# Patient Record
Sex: Male | Born: 1945
Health system: Southern US, Community
[De-identification: ages and names within clinical notes are randomized; demographics above are authoritative.]

## PROBLEM LIST (undated history)

## (undated) DIAGNOSIS — I452 Bifascicular block: Secondary | ICD-10-CM

## (undated) DIAGNOSIS — M1990 Unspecified osteoarthritis, unspecified site: Secondary | ICD-10-CM

## (undated) DIAGNOSIS — F410 Panic disorder [episodic paroxysmal anxiety] without agoraphobia: Secondary | ICD-10-CM

## (undated) DIAGNOSIS — K2900 Acute gastritis without bleeding: Secondary | ICD-10-CM

## (undated) DIAGNOSIS — J449 Chronic obstructive pulmonary disease, unspecified: Secondary | ICD-10-CM

## (undated) DIAGNOSIS — I10 Essential (primary) hypertension: Secondary | ICD-10-CM

## (undated) DIAGNOSIS — G459 Transient cerebral ischemic attack, unspecified: Secondary | ICD-10-CM

## (undated) DIAGNOSIS — D126 Benign neoplasm of colon, unspecified: Secondary | ICD-10-CM

## (undated) DIAGNOSIS — R0601 Orthopnea: Secondary | ICD-10-CM

## (undated) DIAGNOSIS — K209 Esophagitis, unspecified: Secondary | ICD-10-CM

---

## 2005-11-16 DIAGNOSIS — K2900 Acute gastritis without bleeding: Secondary | ICD-10-CM

## 2005-11-16 DIAGNOSIS — D126 Benign neoplasm of colon, unspecified: Secondary | ICD-10-CM

## 2005-11-16 DIAGNOSIS — K209 Esophagitis, unspecified without bleeding: Secondary | ICD-10-CM

## 2005-11-16 HISTORY — DX: Benign neoplasm of colon, unspecified: D12.6

## 2005-11-16 HISTORY — DX: Acute gastritis without bleeding: K29.00

## 2005-11-16 HISTORY — DX: Esophagitis, unspecified without bleeding: K20.90

## 2005-12-23 ENCOUNTER — Ambulatory Visit: Payer: Self-pay | Admitting: Internal Medicine

## 2005-12-30 ENCOUNTER — Ambulatory Visit: Payer: Self-pay | Admitting: Internal Medicine

## 2006-02-03 ENCOUNTER — Ambulatory Visit: Payer: Self-pay | Admitting: Internal Medicine

## 2006-06-28 ENCOUNTER — Ambulatory Visit: Payer: Self-pay | Admitting: General Surgery

## 2015-11-17 HISTORY — PX: SKIN CANCER EXCISION: SHX779

## 2016-05-15 DIAGNOSIS — K9189 Other postprocedural complications and disorders of digestive system: Secondary | ICD-10-CM | POA: Diagnosis not present

## 2016-05-15 DIAGNOSIS — I10 Essential (primary) hypertension: Secondary | ICD-10-CM | POA: Diagnosis not present

## 2016-05-15 DIAGNOSIS — Z85828 Personal history of other malignant neoplasm of skin: Secondary | ICD-10-CM | POA: Diagnosis not present

## 2016-05-15 DIAGNOSIS — Z87891 Personal history of nicotine dependence: Secondary | ICD-10-CM | POA: Diagnosis not present

## 2016-05-15 DIAGNOSIS — H61111 Acquired deformity of pinna, right ear: Secondary | ICD-10-CM | POA: Diagnosis not present

## 2016-05-15 DIAGNOSIS — Z7982 Long term (current) use of aspirin: Secondary | ICD-10-CM | POA: Diagnosis not present

## 2016-05-15 DIAGNOSIS — Z8249 Family history of ischemic heart disease and other diseases of the circulatory system: Secondary | ICD-10-CM | POA: Diagnosis not present

## 2016-05-15 DIAGNOSIS — C44212 Basal cell carcinoma of skin of right ear and external auricular canal: Secondary | ICD-10-CM | POA: Diagnosis not present

## 2016-06-11 DIAGNOSIS — L720 Epidermal cyst: Secondary | ICD-10-CM | POA: Diagnosis not present

## 2016-06-11 DIAGNOSIS — L570 Actinic keratosis: Secondary | ICD-10-CM | POA: Diagnosis not present

## 2016-06-23 DIAGNOSIS — I10 Essential (primary) hypertension: Secondary | ICD-10-CM | POA: Diagnosis not present

## 2016-06-23 DIAGNOSIS — Z7982 Long term (current) use of aspirin: Secondary | ICD-10-CM | POA: Diagnosis not present

## 2016-06-23 DIAGNOSIS — Z79899 Other long term (current) drug therapy: Secondary | ICD-10-CM | POA: Diagnosis not present

## 2016-06-23 DIAGNOSIS — Z48810 Encounter for surgical aftercare following surgery on the sense organs: Secondary | ICD-10-CM | POA: Diagnosis not present

## 2016-06-23 DIAGNOSIS — H938X1 Other specified disorders of right ear: Secondary | ICD-10-CM | POA: Diagnosis not present

## 2016-07-14 DIAGNOSIS — S01301D Unspecified open wound of right ear, subsequent encounter: Secondary | ICD-10-CM | POA: Diagnosis not present

## 2016-08-27 DIAGNOSIS — H938X1 Other specified disorders of right ear: Secondary | ICD-10-CM | POA: Diagnosis not present

## 2016-08-27 DIAGNOSIS — H6001 Abscess of right external ear: Secondary | ICD-10-CM | POA: Diagnosis not present

## 2016-08-31 DIAGNOSIS — H6001 Abscess of right external ear: Secondary | ICD-10-CM | POA: Diagnosis not present

## 2016-09-11 DIAGNOSIS — Z23 Encounter for immunization: Secondary | ICD-10-CM | POA: Diagnosis not present

## 2016-12-04 DIAGNOSIS — I10 Essential (primary) hypertension: Secondary | ICD-10-CM | POA: Diagnosis not present

## 2016-12-04 DIAGNOSIS — Z0000 Encounter for general adult medical examination without abnormal findings: Secondary | ICD-10-CM | POA: Diagnosis not present

## 2016-12-04 DIAGNOSIS — Z23 Encounter for immunization: Secondary | ICD-10-CM | POA: Diagnosis not present

## 2017-02-03 DIAGNOSIS — C44311 Basal cell carcinoma of skin of nose: Secondary | ICD-10-CM | POA: Diagnosis not present

## 2017-02-03 DIAGNOSIS — L578 Other skin changes due to chronic exposure to nonionizing radiation: Secondary | ICD-10-CM | POA: Diagnosis not present

## 2017-02-03 DIAGNOSIS — L570 Actinic keratosis: Secondary | ICD-10-CM | POA: Diagnosis not present

## 2017-02-03 DIAGNOSIS — L821 Other seborrheic keratosis: Secondary | ICD-10-CM | POA: Diagnosis not present

## 2017-02-03 DIAGNOSIS — C44212 Basal cell carcinoma of skin of right ear and external auricular canal: Secondary | ICD-10-CM | POA: Diagnosis not present

## 2017-06-08 DIAGNOSIS — Z23 Encounter for immunization: Secondary | ICD-10-CM | POA: Diagnosis not present

## 2017-06-11 ENCOUNTER — Telehealth: Payer: Self-pay | Admitting: *Deleted

## 2017-06-11 NOTE — Telephone Encounter (Signed)
Patient's wife called asking to speak to Dr. Mike Gip or one of the nurses.  Attempted to call patient back.  No answer and no voice mail box set up.

## 2017-06-15 DIAGNOSIS — C44311 Basal cell carcinoma of skin of nose: Secondary | ICD-10-CM | POA: Diagnosis not present

## 2017-06-15 DIAGNOSIS — C44612 Basal cell carcinoma of skin of right upper limb, including shoulder: Secondary | ICD-10-CM | POA: Diagnosis not present

## 2017-07-08 DIAGNOSIS — C44612 Basal cell carcinoma of skin of right upper limb, including shoulder: Secondary | ICD-10-CM | POA: Diagnosis not present

## 2017-08-26 DIAGNOSIS — Z23 Encounter for immunization: Secondary | ICD-10-CM | POA: Diagnosis not present

## 2017-11-29 DIAGNOSIS — I10 Essential (primary) hypertension: Secondary | ICD-10-CM | POA: Diagnosis not present

## 2017-11-29 DIAGNOSIS — Z0000 Encounter for general adult medical examination without abnormal findings: Secondary | ICD-10-CM | POA: Diagnosis not present

## 2018-02-14 DIAGNOSIS — Z6832 Body mass index (BMI) 32.0-32.9, adult: Secondary | ICD-10-CM | POA: Diagnosis not present

## 2018-02-14 DIAGNOSIS — Z1331 Encounter for screening for depression: Secondary | ICD-10-CM | POA: Diagnosis not present

## 2018-02-14 DIAGNOSIS — Z9181 History of falling: Secondary | ICD-10-CM | POA: Diagnosis not present

## 2018-02-14 DIAGNOSIS — M25551 Pain in right hip: Secondary | ICD-10-CM | POA: Diagnosis not present

## 2018-02-17 DIAGNOSIS — M160 Bilateral primary osteoarthritis of hip: Secondary | ICD-10-CM | POA: Diagnosis not present

## 2018-02-17 DIAGNOSIS — M25551 Pain in right hip: Secondary | ICD-10-CM | POA: Diagnosis not present

## 2018-04-21 DIAGNOSIS — M25551 Pain in right hip: Secondary | ICD-10-CM | POA: Diagnosis not present

## 2018-05-21 DIAGNOSIS — T1502XA Foreign body in cornea, left eye, initial encounter: Secondary | ICD-10-CM | POA: Diagnosis not present

## 2018-05-23 DIAGNOSIS — T1502XA Foreign body in cornea, left eye, initial encounter: Secondary | ICD-10-CM | POA: Diagnosis not present

## 2018-05-31 DIAGNOSIS — M1611 Unilateral primary osteoarthritis, right hip: Secondary | ICD-10-CM | POA: Diagnosis not present

## 2018-05-31 DIAGNOSIS — Z1339 Encounter for screening examination for other mental health and behavioral disorders: Secondary | ICD-10-CM | POA: Diagnosis not present

## 2018-05-31 DIAGNOSIS — Z01818 Encounter for other preprocedural examination: Secondary | ICD-10-CM | POA: Diagnosis not present

## 2018-05-31 DIAGNOSIS — I10 Essential (primary) hypertension: Secondary | ICD-10-CM | POA: Diagnosis not present

## 2018-06-21 ENCOUNTER — Encounter: Payer: Self-pay | Admitting: Student

## 2018-06-21 NOTE — H&P (Signed)
TOTAL HIP ADMISSION H&P  Patient is admitted for right total hip arthroplasty.  Subjective:  Chief Complaint: right hip pain  HPI: Jackson Powers, 72 y.o. male, has a history of pain and functional disability in the right hip(s) due to arthritis and patient has failed non-surgical conservative treatments for greater than 12 weeks to include NSAID's and/or analgesics and activity modification.  Onset of symptoms was abrupt starting 1 year ago with rapidly worsening course since that time.The patient noted no past surgery on the right hip(s).  Patient currently rates pain in the right hip at 2 out of 10 with activity. Patient has worsening of pain with activity and weight bearing and pain that interfers with activities of daily living. Patient has evidence of severe bone-on-bone arthritis with large osteophyte formation by imaging studies. This condition presents safety issues increasing the risk of falls. There is no current active infection.  There are no active problems to display for this patient.  History reviewed. No pertinent past medical history.  History reviewed. No pertinent surgical history.  No current facility-administered medications for this encounter.    No current outpatient medications on file.   Allergies not on file  Social History   Tobacco Use  . Smoking status: Not on file  Substance Use Topics  . Alcohol use: Not on file    History reviewed. No pertinent family history.   Review of Systems  Constitutional: Negative for chills and fever.  HENT: Negative for congestion, sore throat and tinnitus.   Eyes: Negative for double vision, photophobia and pain.  Respiratory: Negative for cough, shortness of breath and wheezing.   Cardiovascular: Negative for chest pain, palpitations and orthopnea.  Gastrointestinal: Negative for heartburn, nausea and vomiting.  Genitourinary: Negative for dysuria, frequency and urgency.  Musculoskeletal: Positive for joint pain.    Neurological: Negative for dizziness, weakness and headaches.  Psychiatric/Behavioral: Negative for depression.    Objective:  Physical Exam  Well nourished and well developed. General: Alert and oriented x3, cooperative and pleasant, no acute distress. Head: normocephalic, atraumatic, neck supple. Eyes: EOMI. Respiratory: breath sounds clear in all fields, no wheezing, rales, or rhonchi. Cardiovascular: Regular rate and rhythm, no murmurs, gallops or rubs.  Abdomen: non-tender to palpation and soft, normoactive bowel sounds. Musculoskeletal: Significantly anantalgic gait without the use of assisted devices.  Right Hip Exam: ROM: Flexion to 90, Internal Rotation is minimal, External Rotation 10, and abduction 10 without discomfort. There is no tenderness over the greater trochanter.  Left Hip Exam: ROM: is normal without discomfort. There is no tenderness over the greater trochanter.  Bilateral Knee Exam: No effusion. Range of motion is 0-125 degrees. No crepitus on range of motion of the knee. No medial or lateral joint line tenderness. Stable knees. Calves soft and nontender. Motor function intact in LE. Strength 5/5 LE bilaterally. Neuro: Distal pulses 2+. Sensation to light touch intact in LE.  Vital signs in last 24 hours: Blood pressure: 138/76 mmHg Pulse: 64 bpm  Labs:   There is no height or weight on file to calculate BMI.   Imaging Review Plain radiographs demonstrate severe degenerative joint disease of the right hip(s). The bone quality appears to be adequate for age and reported activity level.    Preoperative templating of the joint replacement has been completed, documented, and submitted to the Operating Room personnel in order to optimize intra-operative equipment management.     Assessment/Plan:  End stage arthritis, right hip(s)  The patient history, physical examination, clinical judgement  of the provider and imaging studies are consistent with end  stage degenerative joint disease of the right hip(s) and total hip arthroplasty is deemed medically necessary. The treatment options including medical management, injection therapy, arthroscopy and arthroplasty were discussed at length. The risks and benefits of total hip arthroplasty were presented and reviewed. The risks due to aseptic loosening, infection, stiffness, dislocation/subluxation,  thromboembolic complications and other imponderables were discussed.  The patient acknowledged the explanation, agreed to proceed with the plan and consent was signed. Patient is being admitted for inpatient treatment for surgery, pain control, PT, OT, prophylactic antibiotics, VTE prophylaxis, progressive ambulation and ADL's and discharge planning.The patient is planning to be discharged home with HEP.   Therapy Plans: HEP Disposition: Home with wife Planned DVT Prophylaxis: Aspirin 325 mg BID DME needed: None PCP: Cyndi Bender, PA-C (medical clearance provided) TXA: IV Allergies: None  - Patient was instructed on what medications to stop prior to surgery. - Follow-up visit in 2 weeks with Dr. Wynelle Link - Begin physical therapy following surgery - Pre-operative lab work as pre-surgical testing - Prescriptions will be provided in hospital at time of discharge  Theresa Duty, PA-C Orthopedic Surgery [facility] [place]

## 2018-07-07 ENCOUNTER — Encounter (HOSPITAL_COMMUNITY): Payer: Self-pay

## 2018-07-07 NOTE — Patient Instructions (Signed)
Jackson Powers  07/07/2018   Your procedure is scheduled on: 07-20-18   Report to [facility] Main  Entrance    Report to admitting at 10:45AM    Call this number if you have problems the morning of surgery [phone]     Remember: Do not eat food After Midnight. YOU MAY HAVE CLEAR LIQUIDS FROM MIDNIGHT UNTIL 7:15AM. NOTHING BY MOUTH AFTER 7:15AM!   CLEAR LIQUID DIET   Foods Allowed                                                                     Foods Excluded  Coffee and tea, regular and decaf                             liquids that you cannot  Plain Jell-O in any flavor                                             see through such as: Fruit ices (not with fruit pulp)                                     milk, soups, orange juice  Iced Popsicles                                    All solid food Carbonated beverages, regular and diet                                    Cranberry, grape and apple juices Sports drinks like Gatorade Lightly seasoned clear broth or consume(fat free) Sugar, honey syrup  Sample Menu Breakfast                                Lunch                                     Supper Cranberry juice                    Beef broth                            Chicken broth Jell-O                                     Grape juice                           Apple juice Coffee or tea  Jell-O                                      Popsicle                                                Coffee or tea                        Coffee or tea  _____________________________________________________________________       Take these medicines the morning of surgery with A SIP OF WATER: AMLODIPINE                                 You may not have any metal on your body including hair pins and              piercings  Do not wear jewelry, make-up, lotions, powders or perfumes, deodorant                    Men may shave face and  neck.   Do not bring valuables to the hospital. [place].  Contacts, dentures or bridgework may not be worn into surgery.  Leave suitcase in the car. After surgery it may be brought to your room.                 Please read over the following fact sheets you were given: _____________________________________________________________________             [facility] - Preparing for Surgery Before surgery, you can play an important role.  Because skin is not sterile, your skin needs to be as free of germs as possible.  You can reduce the number of germs on your skin by washing with CHG (chlorahexidine gluconate) soap before surgery.  CHG is an antiseptic cleaner which kills germs and bonds with the skin to continue killing germs even after washing. Please DO NOT use if you have an allergy to CHG or antibacterial soaps.  If your skin becomes reddened/irritated stop using the CHG and inform your nurse when you arrive at Short Stay. Do not shave (including legs and underarms) for at least 48 hours prior to the first CHG shower.  You may shave your face/neck. Please follow these instructions carefully:  1.  Shower with CHG Soap the night before surgery and the  morning of Surgery.  2.  If you choose to wash your hair, wash your hair first as usual with your  normal  shampoo.  3.  After you shampoo, rinse your hair and body thoroughly to remove the  shampoo.                           4.  Use CHG as you would any other liquid soap.  You can apply chg directly  to the skin and wash                       Gently with a scrungie or clean washcloth.  5.  Apply  the CHG Soap to your body ONLY FROM THE NECK DOWN.   Do not use on face/ open                           Wound or open sores. Avoid contact with eyes, ears mouth and genitals (private parts).                       Wash face,  Genitals (private parts) with your normal soap.             6.  Wash  thoroughly, paying special attention to the area where your surgery  will be performed.  7.  Thoroughly rinse your body with warm water from the neck down.  8.  DO NOT shower/wash with your normal soap after using and rinsing off  the CHG Soap.                9.  Pat yourself dry with a clean towel.            10.  Wear clean pajamas.            11.  Place clean sheets on your bed the night of your first shower and do not  sleep with pets. Day of Surgery : Do not apply any lotions/deodorants the morning of surgery.  Please wear clean clothes to the hospital/surgery center.  FAILURE TO FOLLOW THESE INSTRUCTIONS MAY RESULT IN THE CANCELLATION OF YOUR SURGERY PATIENT SIGNATURE_________________________________  NURSE SIGNATURE__________________________________  ________________________________________________________________________   Adam Phenix  An incentive spirometer is a tool that can help keep your lungs clear and active. This tool measures how well you are filling your lungs with each breath. Taking long deep breaths may help reverse or decrease the chance of developing breathing (pulmonary) problems (especially infection) following:  A long period of time when you are unable to move or be active. BEFORE THE PROCEDURE   If the spirometer includes an indicator to show your best effort, your nurse or respiratory therapist will set it to a desired goal.  If possible, sit up straight or lean slightly forward. Try not to slouch.  Hold the incentive spirometer in an upright position. INSTRUCTIONS FOR USE  1. Sit on the edge of your bed if possible, or sit up as far as you can in bed or on a chair. 2. Hold the incentive spirometer in an upright position. 3. Breathe out normally. 4. Place the mouthpiece in your mouth and seal your lips tightly around it. 5. Breathe in slowly and as deeply as possible, raising the piston or the ball toward the top of the column. 6. Hold your  breath for 3-5 seconds or for as long as possible. Allow the piston or ball to fall to the bottom of the column. 7. Remove the mouthpiece from your mouth and breathe out normally. 8. Rest for a few seconds and repeat Steps 1 through 7 at least 10 times every 1-2 hours when you are awake. Take your time and take a few normal breaths between deep breaths. 9. The spirometer may include an indicator to show your best effort. Use the indicator as a goal to work toward during each repetition. 10. After each set of 10 deep breaths, practice coughing to be sure your lungs are clear. If you have an incision (the cut made at the time of surgery), support your incision when coughing by placing a pillow or  rolled up towels firmly against it. Once you are able to get out of bed, walk around indoors and cough well. You may stop using the incentive spirometer when instructed by your caregiver.  RISKS AND COMPLICATIONS  Take your time so you do not get dizzy or light-headed.  If you are in pain, you may need to take or ask for pain medication before doing incentive spirometry. It is harder to take a deep breath if you are having pain. AFTER USE  Rest and breathe slowly and easily.  It can be helpful to keep track of a log of your progress. Your caregiver can provide you with a simple table to help with this. If you are using the spirometer at home, follow these instructions: [place] IF:   You are having difficultly using the spirometer.  You have trouble using the spirometer as often as instructed.  Your pain medication is not giving enough relief while using the spirometer.  You develop fever of 100.5 F (38.1 C) or higher. SEEK IMMEDIATE MEDICAL CARE IF:   You cough up bloody sputum that had not been present before.  You develop fever of 102 F (38.9 C) or greater.  You develop worsening pain at or near the incision site. MAKE SURE YOU:   Understand these instructions.  Will watch  your condition.  Will get help right away if you are not doing well or get worse. Document Released: 03/15/2007 Document Revised: 01/25/2012 Document Reviewed: 05/16/2007 Exit[employer] Patient Information 2014 Exit[employer], [place]       WHAT IS A BLOOD TRANSFUSION? Blood Transfusion Information  A transfusion is the replacement of blood or some of its parts. Blood is made up of multiple cells which provide different functions.  Red blood cells carry oxygen and are used for blood loss replacement.  White blood cells fight against infection.  Platelets control bleeding.  Plasma helps clot blood.  Other blood products are available for specialized needs, such as hemophilia or other clotting disorders. BEFORE THE TRANSFUSION  Who gives blood for transfusions?   Healthy volunteers who are fully evaluated to make sure their blood is safe. This is blood bank blood. Transfusion therapy is the safest it has ever been in the practice of medicine. Before blood is taken from a donor, a complete history is taken to make sure that person has no history of diseases nor engages in risky social behavior (examples are intravenous drug use or sexual activity with multiple partners). The donor's travel history is screened to minimize risk of transmitting infections, such as malaria. The donated blood is tested for signs of infectious diseases, such as HIV and hepatitis. The blood is then tested to be sure it is compatible with you in order to minimize the chance of a transfusion reaction. If you or a relative donates blood, this is often done in anticipation of surgery and is not appropriate for emergency situations. It takes many days to process the donated blood. RISKS AND COMPLICATIONS Although transfusion therapy is very safe and saves many lives, the main dangers of transfusion include:   Getting an infectious disease.  Developing a transfusion reaction. This is an allergic reaction to something in the blood  you were given. Every precaution is taken to prevent this. The decision to have a blood transfusion has been considered carefully by your caregiver before blood is given. Blood is not given unless the benefits outweigh the risks. AFTER THE TRANSFUSION  Right after receiving a blood transfusion, you  will usually feel much better and more energetic. This is especially true if your red blood cells have gotten low (anemic). The transfusion raises the level of the red blood cells which carry oxygen, and this usually causes an energy increase.  The nurse administering the transfusion will monitor you carefully for complications. HOME CARE INSTRUCTIONS  No special instructions are needed after a transfusion. You may find your energy is better. Speak with your caregiver about any limitations on activity for underlying diseases you may have. SEEK MEDICAL CARE IF:   Your condition is not improving after your transfusion.  You develop redness or irritation at the intravenous (IV) site. SEEK IMMEDIATE MEDICAL CARE IF:  Any of the following symptoms occur over the next 12 hours:  Shaking chills.  You have a temperature by mouth above 102 F (38.9 C), not controlled by medicine.  Chest, back, or muscle pain.  People around you feel you are not acting correctly or are confused.  Shortness of breath or difficulty breathing.  Dizziness and fainting.  You get a rash or develop hives.  You have a decrease in urine output.  Your urine turns a dark color or changes to pink, red, or brown. Any of the following symptoms occur over the next 10 days:  You have a temperature by mouth above 102 F (38.9 C), not controlled by medicine.  Shortness of breath.  Weakness after normal activity.  The white part of the eye turns yellow (jaundice).  You have a decrease in the amount of urine or are urinating less often.  Your urine turns a dark color or changes to pink, red, or brown. Document Released:  10/30/2000 Document Revised: 01/25/2012 Document Reviewed: 06/18/2008 [facility] Patient Information 2014 [place], [place].  _______________________________________________________________________

## 2018-07-07 NOTE — Progress Notes (Signed)
LOV/SURGICAL CLEARANCE , NATHAN CONROY PA-C ON CHART   EKG 05-31-18 ON CHART FROM [place]   STRESS TEST 2016 ON CHART FROM [place]

## 2018-07-11 ENCOUNTER — Encounter (HOSPITAL_COMMUNITY)
Admission: RE | Admit: 2018-07-11 | Discharge: 2018-07-11 | Disposition: A | Discharge status: Home / Self Care | Payer: BLUE CROSS/BLUE SHIELD | Source: Ambulatory Visit | Attending: Orthopedic Surgery | Admitting: Orthopedic Surgery

## 2018-07-11 ENCOUNTER — Encounter (HOSPITAL_COMMUNITY): Payer: Self-pay

## 2018-07-11 ENCOUNTER — Other Ambulatory Visit: Payer: Self-pay

## 2018-07-11 DIAGNOSIS — Z01812 Encounter for preprocedural laboratory examination: Secondary | ICD-10-CM | POA: Diagnosis not present

## 2018-07-11 DIAGNOSIS — M1611 Unilateral primary osteoarthritis, right hip: Secondary | ICD-10-CM | POA: Diagnosis not present

## 2018-07-11 HISTORY — DX: Esophagitis, unspecified: K20.9

## 2018-07-11 HISTORY — DX: Orthopnea: R06.01

## 2018-07-11 HISTORY — DX: Unspecified osteoarthritis, unspecified site: M19.90

## 2018-07-11 HISTORY — DX: Benign neoplasm of colon, unspecified: D12.6

## 2018-07-11 HISTORY — DX: Essential (primary) hypertension: I10

## 2018-07-11 HISTORY — DX: Panic disorder (episodic paroxysmal anxiety): F41.0

## 2018-07-11 HISTORY — DX: Bifascicular block: I45.2

## 2018-07-11 HISTORY — DX: Acute gastritis without bleeding: K29.00

## 2018-07-11 LAB — COMPREHENSIVE METABOLIC PANEL
ALT: 35 U/L (ref 0–44)
AST: 28 U/L (ref 15–41)
Albumin: 4.5 g/dL (ref 3.5–5.0)
Alkaline Phosphatase: 33 U/L — ABNORMAL LOW (ref 38–126)
Anion gap: 9 (ref 5–15)
BILIRUBIN TOTAL: 0.6 mg/dL (ref 0.3–1.2)
BUN: 14 mg/dL (ref 8–23)
CO2: 25 mmol/L (ref 22–32)
Calcium: 9.5 mg/dL (ref 8.9–10.3)
Chloride: 107 mmol/L (ref 98–111)
Creatinine, Ser: 0.85 mg/dL (ref 0.61–1.24)
Glucose, Bld: 107 mg/dL — ABNORMAL HIGH (ref 70–99)
POTASSIUM: 3.9 mmol/L (ref 3.5–5.1)
Sodium: 141 mmol/L (ref 135–145)
TOTAL PROTEIN: 7.5 g/dL (ref 6.5–8.1)

## 2018-07-11 LAB — CBC
HCT: 43.4 % (ref 39.0–52.0)
Hemoglobin: 15.3 g/dL (ref 13.0–17.0)
MCH: 31.5 pg (ref 26.0–34.0)
MCHC: 35.3 g/dL (ref 30.0–36.0)
MCV: 89.3 fL (ref 78.0–100.0)
PLATELETS: 235 10*3/uL (ref 150–400)
RBC: 4.86 MIL/uL (ref 4.22–5.81)
RDW: 13.1 % (ref 11.5–15.5)
WBC: 7.8 10*3/uL (ref 4.0–10.5)

## 2018-07-11 LAB — SURGICAL PCR SCREEN
MRSA, PCR: NEGATIVE
Staphylococcus aureus: NEGATIVE

## 2018-07-11 LAB — PROTIME-INR
INR: 1
PROTHROMBIN TIME: 13.1 s (ref 11.4–15.2)

## 2018-07-11 LAB — ABO/RH: ABO/RH(D): A POS

## 2018-07-11 LAB — APTT: aPTT: 38 seconds — ABNORMAL HIGH (ref 24–36)

## 2018-07-11 NOTE — Progress Notes (Signed)
At pre-op appointment, patient reports orthopnea at nigh using 2 pillows for support. Anesthesia consult done with Dr Renold Don for this reason. RN reviewed notes of last Stress test and last EKG interpretation with MD. MD asked if patient can manage climbing a flight of stairs without SOB or much difficulty (keeping in mind patient is here for THA), RN asked patient this while Dr Fransisco Beau listening on the phone. patient replied "yeah I do okay". Per Dr Fransisco Beau, patient may proceed as scheduled. Patient verbalized understanding. No other recommendations received.

## 2018-07-20 ENCOUNTER — Encounter (HOSPITAL_COMMUNITY)
Admission: RE | Disposition: A | Discharge status: Home Health | Payer: Self-pay | Source: Home / Self Care | Attending: Orthopedic Surgery

## 2018-07-20 ENCOUNTER — Inpatient Hospital Stay (HOSPITAL_COMMUNITY): Payer: BLUE CROSS/BLUE SHIELD | Admitting: Certified Registered Nurse Anesthetist

## 2018-07-20 ENCOUNTER — Inpatient Hospital Stay (HOSPITAL_COMMUNITY): Payer: BLUE CROSS/BLUE SHIELD

## 2018-07-20 ENCOUNTER — Inpatient Hospital Stay (HOSPITAL_COMMUNITY)
Admission: RE | Admit: 2018-07-20 | Discharge: 2018-07-21 | DRG: 470 | Disposition: A | Discharge status: Home Health | Payer: BLUE CROSS/BLUE SHIELD | Attending: Orthopedic Surgery | Admitting: Orthopedic Surgery

## 2018-07-20 ENCOUNTER — Encounter (HOSPITAL_COMMUNITY): Payer: Self-pay

## 2018-07-20 ENCOUNTER — Other Ambulatory Visit: Payer: Self-pay

## 2018-07-20 DIAGNOSIS — M169 Osteoarthritis of hip, unspecified: Secondary | ICD-10-CM | POA: Diagnosis present

## 2018-07-20 DIAGNOSIS — I10 Essential (primary) hypertension: Secondary | ICD-10-CM | POA: Diagnosis present

## 2018-07-20 DIAGNOSIS — M25751 Osteophyte, right hip: Secondary | ICD-10-CM | POA: Diagnosis present

## 2018-07-20 DIAGNOSIS — Z471 Aftercare following joint replacement surgery: Secondary | ICD-10-CM | POA: Diagnosis not present

## 2018-07-20 DIAGNOSIS — M25551 Pain in right hip: Secondary | ICD-10-CM | POA: Diagnosis not present

## 2018-07-20 DIAGNOSIS — Z6830 Body mass index (BMI) 30.0-30.9, adult: Secondary | ICD-10-CM | POA: Diagnosis not present

## 2018-07-20 DIAGNOSIS — E669 Obesity, unspecified: Secondary | ICD-10-CM | POA: Diagnosis present

## 2018-07-20 DIAGNOSIS — M1611 Unilateral primary osteoarthritis, right hip: Secondary | ICD-10-CM | POA: Diagnosis not present

## 2018-07-20 DIAGNOSIS — F419 Anxiety disorder, unspecified: Secondary | ICD-10-CM | POA: Diagnosis present

## 2018-07-20 DIAGNOSIS — F410 Panic disorder [episodic paroxysmal anxiety] without agoraphobia: Secondary | ICD-10-CM | POA: Diagnosis present

## 2018-07-20 DIAGNOSIS — Z96649 Presence of unspecified artificial hip joint: Secondary | ICD-10-CM

## 2018-07-20 DIAGNOSIS — Z96641 Presence of right artificial hip joint: Secondary | ICD-10-CM | POA: Diagnosis not present

## 2018-07-20 DIAGNOSIS — I4510 Unspecified right bundle-branch block: Secondary | ICD-10-CM | POA: Diagnosis present

## 2018-07-20 HISTORY — PX: TOTAL HIP ARTHROPLASTY: SHX124

## 2018-07-20 LAB — TYPE AND SCREEN
ABO/RH(D): A POS
Antibody Screen: NEGATIVE

## 2018-07-20 LAB — GLUCOSE, CAPILLARY: GLUCOSE-CAPILLARY: 178 mg/dL — AB (ref 70–99)

## 2018-07-20 SURGERY — ARTHROPLASTY, HIP, TOTAL, ANTERIOR APPROACH
Anesthesia: Spinal | Site: Hip | Laterality: Right

## 2018-07-20 MED ORDER — CHLORHEXIDINE GLUCONATE 4 % EX LIQD: 60.0000 mL | Freq: Once | CUTANEOUS | Status: DC

## 2018-07-20 MED ORDER — PROPOFOL 500 MG/50ML IV EMUL
INTRAVENOUS | Status: DC | PRN
  Administered 2018-07-20: 70 ug/kg/min via INTRAVENOUS

## 2018-07-20 MED ORDER — DEXAMETHASONE SODIUM PHOSPHATE 10 MG/ML IJ SOLN
8.0000 mg | Freq: Once | INTRAMUSCULAR | Status: AC
  Administered 2018-07-20: 8 mg via INTRAVENOUS

## 2018-07-20 MED ORDER — 0.9 % SODIUM CHLORIDE (POUR BTL) OPTIME
TOPICAL | Status: DC | PRN
  Administered 2018-07-20: 1000 mL

## 2018-07-20 MED ORDER — METHOCARBAMOL 500 MG IVPB - SIMPLE MED
500.0000 mg | Freq: Four times a day (QID) | INTRAVENOUS | Status: DC | PRN
  Filled 2018-07-20: qty 50

## 2018-07-20 MED ORDER — SODIUM CHLORIDE 0.9 % IV SOLN
INTRAVENOUS | Status: DC
  Administered 2018-07-20 – 2018-07-21 (×2): via INTRAVENOUS

## 2018-07-20 MED ORDER — PROPOFOL 10 MG/ML IV BOLUS
INTRAVENOUS | Status: AC
  Filled 2018-07-20: qty 20

## 2018-07-20 MED ORDER — BISACODYL 10 MG RE SUPP: 10.0000 mg | Freq: Every day | RECTAL | Status: DC | PRN

## 2018-07-20 MED ORDER — DIPHENHYDRAMINE HCL 12.5 MG/5ML PO ELIX: 12.5000 mg | ORAL_SOLUTION | ORAL | Status: DC | PRN

## 2018-07-20 MED ORDER — PHENOL 1.4 % MT LIQD
1.0000 | OROMUCOSAL | Status: DC | PRN
  Filled 2018-07-20: qty 177

## 2018-07-20 MED ORDER — TRANEXAMIC ACID 1000 MG/10ML IV SOLN
1000.0000 mg | INTRAVENOUS | Status: AC
  Administered 2018-07-20: 1000 mg via INTRAVENOUS
  Filled 2018-07-20: qty 10

## 2018-07-20 MED ORDER — METOCLOPRAMIDE HCL 5 MG PO TABS: 5.0000 mg | ORAL_TABLET | Freq: Three times a day (TID) | ORAL | Status: DC | PRN

## 2018-07-20 MED ORDER — FLEET ENEMA 7-19 GM/118ML RE ENEM: 1.0000 | ENEMA | Freq: Once | RECTAL | Status: DC | PRN

## 2018-07-20 MED ORDER — LACTATED RINGERS IV SOLN
INTRAVENOUS | Status: DC
  Administered 2018-07-20 (×2): via INTRAVENOUS
  Administered 2018-07-20: 1000 mL via INTRAVENOUS

## 2018-07-20 MED ORDER — BUPIVACAINE HCL (PF) 0.25 % IJ SOLN
INTRAMUSCULAR | Status: AC
  Filled 2018-07-20: qty 30

## 2018-07-20 MED ORDER — ACETAMINOPHEN 10 MG/ML IV SOLN
1000.0000 mg | Freq: Four times a day (QID) | INTRAVENOUS | Status: DC
  Administered 2018-07-20: 1000 mg via INTRAVENOUS
  Filled 2018-07-20: qty 100

## 2018-07-20 MED ORDER — MENTHOL 3 MG MT LOZG: 1.0000 | LOZENGE | OROMUCOSAL | Status: DC | PRN

## 2018-07-20 MED ORDER — PROPOFOL 10 MG/ML IV BOLUS
INTRAVENOUS | Status: AC
  Filled 2018-07-20: qty 60

## 2018-07-20 MED ORDER — ASPIRIN EC 325 MG PO TBEC
325.0000 mg | DELAYED_RELEASE_TABLET | Freq: Two times a day (BID) | ORAL | Status: DC
  Administered 2018-07-21: 325 mg via ORAL
  Filled 2018-07-20: qty 1

## 2018-07-20 MED ORDER — MORPHINE SULFATE (PF) 2 MG/ML IV SOLN: 0.5000 mg | INTRAVENOUS | Status: DC | PRN

## 2018-07-20 MED ORDER — FENTANYL CITRATE (PF) 100 MCG/2ML IJ SOLN
INTRAMUSCULAR | Status: DC | PRN
  Administered 2018-07-20: 50 ug via INTRAVENOUS

## 2018-07-20 MED ORDER — EPHEDRINE SULFATE-NACL 50-0.9 MG/10ML-% IV SOSY
PREFILLED_SYRINGE | INTRAVENOUS | Status: DC | PRN
  Administered 2018-07-20 (×3): 5 mg via INTRAVENOUS
  Administered 2018-07-20 (×3): 10 mg via INTRAVENOUS

## 2018-07-20 MED ORDER — ACETAMINOPHEN 500 MG PO TABS
500.0000 mg | ORAL_TABLET | Freq: Four times a day (QID) | ORAL | Status: DC
  Administered 2018-07-20 – 2018-07-21 (×2): 500 mg via ORAL
  Filled 2018-07-20 (×2): qty 1

## 2018-07-20 MED ORDER — METOCLOPRAMIDE HCL 5 MG/ML IJ SOLN
5.0000 mg | Freq: Three times a day (TID) | INTRAMUSCULAR | Status: DC | PRN
  Administered 2018-07-21: 5 mg via INTRAVENOUS
  Filled 2018-07-20: qty 2

## 2018-07-20 MED ORDER — HYDROCODONE-ACETAMINOPHEN 5-325 MG PO TABS
1.0000 | ORAL_TABLET | ORAL | Status: DC | PRN
  Administered 2018-07-20: 1 via ORAL
  Administered 2018-07-20: 2 via ORAL
  Filled 2018-07-20: qty 2
  Filled 2018-07-20: qty 1

## 2018-07-20 MED ORDER — HYDROCODONE-ACETAMINOPHEN 7.5-325 MG PO TABS
1.0000 | ORAL_TABLET | ORAL | Status: DC | PRN
  Administered 2018-07-21 (×2): 2 via ORAL
  Filled 2018-07-20 (×2): qty 2

## 2018-07-20 MED ORDER — MIDAZOLAM HCL 5 MG/5ML IJ SOLN
INTRAMUSCULAR | Status: DC | PRN
  Administered 2018-07-20 (×2): 1 mg via INTRAVENOUS

## 2018-07-20 MED ORDER — DEXAMETHASONE SODIUM PHOSPHATE 10 MG/ML IJ SOLN
10.0000 mg | Freq: Once | INTRAMUSCULAR | Status: AC
  Administered 2018-07-21: 10 mg via INTRAVENOUS
  Filled 2018-07-20: qty 1

## 2018-07-20 MED ORDER — METHOCARBAMOL 500 MG PO TABS
500.0000 mg | ORAL_TABLET | Freq: Four times a day (QID) | ORAL | Status: DC | PRN
  Administered 2018-07-20 – 2018-07-21 (×2): 500 mg via ORAL
  Filled 2018-07-20 (×2): qty 1

## 2018-07-20 MED ORDER — ONDANSETRON HCL 4 MG/2ML IJ SOLN: 4.0000 mg | Freq: Once | INTRAMUSCULAR | Status: DC | PRN

## 2018-07-20 MED ORDER — TRAMADOL HCL 50 MG PO TABS
50.0000 mg | ORAL_TABLET | Freq: Four times a day (QID) | ORAL | Status: DC | PRN
  Administered 2018-07-20: 100 mg via ORAL
  Filled 2018-07-20: qty 2

## 2018-07-20 MED ORDER — AMLODIPINE BESYLATE 5 MG PO TABS
5.0000 mg | ORAL_TABLET | Freq: Every day | ORAL | Status: DC
  Administered 2018-07-21: 5 mg via ORAL
  Filled 2018-07-20: qty 1

## 2018-07-20 MED ORDER — DEXAMETHASONE SODIUM PHOSPHATE 10 MG/ML IJ SOLN
INTRAMUSCULAR | Status: AC
  Filled 2018-07-20: qty 1

## 2018-07-20 MED ORDER — BUPIVACAINE IN DEXTROSE 0.75-8.25 % IT SOLN
INTRATHECAL | Status: DC | PRN
  Administered 2018-07-20: 2 mL via INTRATHECAL

## 2018-07-20 MED ORDER — FENTANYL CITRATE (PF) 100 MCG/2ML IJ SOLN: 25.0000 ug | INTRAMUSCULAR | Status: DC | PRN

## 2018-07-20 MED ORDER — PROPOFOL 10 MG/ML IV BOLUS
INTRAVENOUS | Status: DC | PRN
  Administered 2018-07-20: 10 mg via INTRAVENOUS
  Administered 2018-07-20: 20 mg via INTRAVENOUS
  Administered 2018-07-20 (×3): 10 mg via INTRAVENOUS

## 2018-07-20 MED ORDER — CEFAZOLIN SODIUM-DEXTROSE 2-4 GM/100ML-% IV SOLN
2.0000 g | Freq: Four times a day (QID) | INTRAVENOUS | Status: AC
  Administered 2018-07-20 – 2018-07-21 (×2): 2 g via INTRAVENOUS
  Filled 2018-07-20 (×2): qty 100

## 2018-07-20 MED ORDER — POLYETHYLENE GLYCOL 3350 17 G PO PACK: 17.0000 g | PACK | Freq: Every day | ORAL | Status: DC | PRN

## 2018-07-20 MED ORDER — CEFAZOLIN SODIUM-DEXTROSE 2-4 GM/100ML-% IV SOLN
INTRAVENOUS | Status: AC
  Filled 2018-07-20: qty 100

## 2018-07-20 MED ORDER — CEFAZOLIN SODIUM-DEXTROSE 2-4 GM/100ML-% IV SOLN
2.0000 g | INTRAVENOUS | Status: AC
  Administered 2018-07-20: 2 g via INTRAVENOUS

## 2018-07-20 MED ORDER — ONDANSETRON HCL 4 MG/2ML IJ SOLN
INTRAMUSCULAR | Status: AC
  Filled 2018-07-20: qty 2

## 2018-07-20 MED ORDER — FENTANYL CITRATE (PF) 100 MCG/2ML IJ SOLN
INTRAMUSCULAR | Status: AC
  Filled 2018-07-20: qty 2

## 2018-07-20 MED ORDER — ONDANSETRON HCL 4 MG PO TABS: 4.0000 mg | ORAL_TABLET | Freq: Four times a day (QID) | ORAL | Status: DC | PRN

## 2018-07-20 MED ORDER — ONDANSETRON HCL 4 MG/2ML IJ SOLN
INTRAMUSCULAR | Status: DC | PRN
  Administered 2018-07-20: 4 mg via INTRAVENOUS

## 2018-07-20 MED ORDER — ONDANSETRON HCL 4 MG/2ML IJ SOLN
4.0000 mg | Freq: Four times a day (QID) | INTRAMUSCULAR | Status: DC | PRN
  Administered 2018-07-21: 4 mg via INTRAVENOUS
  Filled 2018-07-20: qty 2

## 2018-07-20 MED ORDER — MIDAZOLAM HCL 2 MG/2ML IJ SOLN
INTRAMUSCULAR | Status: AC
  Filled 2018-07-20: qty 2

## 2018-07-20 MED ORDER — DOCUSATE SODIUM 100 MG PO CAPS
100.0000 mg | ORAL_CAPSULE | Freq: Two times a day (BID) | ORAL | Status: DC
  Administered 2018-07-20 – 2018-07-21 (×2): 100 mg via ORAL
  Filled 2018-07-20 (×2): qty 1

## 2018-07-20 MED ORDER — BUPIVACAINE HCL 0.25 % IJ SOLN
INTRAMUSCULAR | Status: DC | PRN
  Administered 2018-07-20: 30 mL

## 2018-07-20 SURGICAL SUPPLY — 42 items
BAG DECANTER FOR FLEXI CONT (MISCELLANEOUS) ×3 IMPLANT
BAG ZIPLOCK 12X15 (MISCELLANEOUS) IMPLANT
BLADE SAG 18X100X1.27 (BLADE) ×3 IMPLANT
CLOSURE WOUND 1/2 X4 (GAUZE/BANDAGES/DRESSINGS) ×1
COVER PERINEAL POST (MISCELLANEOUS) ×3 IMPLANT
COVER SURGICAL LIGHT HANDLE (MISCELLANEOUS) ×3 IMPLANT
CUP ACET PINNACLE SECTR 56MM (Hips) ×1 IMPLANT
DECANTER SPIKE VIAL GLASS SM (MISCELLANEOUS) ×3 IMPLANT
DRAPE STERI IOBAN 125X83 (DRAPES) ×3 IMPLANT
DRAPE U-SHAPE 47X51 STRL (DRAPES) ×6 IMPLANT
DRSG ADAPTIC 3X8 NADH LF (GAUZE/BANDAGES/DRESSINGS) ×3 IMPLANT
DRSG MEPILEX BORDER 4X4 (GAUZE/BANDAGES/DRESSINGS) ×3 IMPLANT
DRSG MEPILEX BORDER 4X8 (GAUZE/BANDAGES/DRESSINGS) ×3 IMPLANT
DURAPREP 26ML APPLICATOR (WOUND CARE) ×3 IMPLANT
ELECT REM PT RETURN 15FT ADLT (MISCELLANEOUS) ×3 IMPLANT
EVACUATOR 1/8 PVC DRAIN (DRAIN) ×3 IMPLANT
GLOVE BIO SURGEON STRL SZ7 (GLOVE) ×3 IMPLANT
GLOVE BIO SURGEON STRL SZ8 (GLOVE) ×3 IMPLANT
GLOVE BIOGEL PI IND STRL 7.0 (GLOVE) ×1 IMPLANT
GLOVE BIOGEL PI IND STRL 8 (GLOVE) ×1 IMPLANT
GLOVE BIOGEL PI INDICATOR 7.0 (GLOVE) ×2
GLOVE BIOGEL PI INDICATOR 8 (GLOVE) ×2
GOWN STRL REUS W/TWL LRG LVL3 (GOWN DISPOSABLE) ×3 IMPLANT
GOWN STRL REUS W/TWL XL LVL3 (GOWN DISPOSABLE) ×3 IMPLANT
HEAD CERAMIC DELTA 36 PLUS 1.5 (Hips) ×3 IMPLANT
HOLDER FOLEY CATH W/STRAP (MISCELLANEOUS) ×3 IMPLANT
LINER MARATHON 4 NEUTRAL 36X56 (Hips) ×3 IMPLANT
MANIFOLD NEPTUNE II (INSTRUMENTS) ×3 IMPLANT
PACK ANTERIOR HIP CUSTOM (KITS) ×3 IMPLANT
PINNACLE SECTOR CUP 56MM (Hips) ×3 IMPLANT
STEM FEMORAL SZ9 HIGH ACTIS (Stem) ×3 IMPLANT
STRIP CLOSURE SKIN 1/2X4 (GAUZE/BANDAGES/DRESSINGS) ×2 IMPLANT
SUT ETHIBOND NAB CT1 #1 30IN (SUTURE) ×3 IMPLANT
SUT MNCRL AB 4-0 PS2 18 (SUTURE) ×3 IMPLANT
SUT STRATAFIX 0 PDS 27 VIOLET (SUTURE) ×3
SUT VIC AB 2-0 CT1 27 (SUTURE) ×6
SUT VIC AB 2-0 CT1 TAPERPNT 27 (SUTURE) ×2 IMPLANT
SUTURE STRATFX 0 PDS 27 VIOLET (SUTURE) ×1 IMPLANT
SYR 50ML LL SCALE MARK (SYRINGE) IMPLANT
TAPE STRIPS DRAPE STRL (GAUZE/BANDAGES/DRESSINGS) ×3 IMPLANT
TRAY FOLEY MTR SLVR 16FR STAT (SET/KITS/TRAYS/PACK) ×3 IMPLANT
YANKAUER SUCT BULB TIP 10FT TU (MISCELLANEOUS) ×3 IMPLANT

## 2018-07-20 NOTE — Plan of Care (Signed)
Pt alert and oriented, resting with family at the bedside.  RN will monitor.

## 2018-07-20 NOTE — Op Note (Signed)
OPERATIVE REPORT- TOTAL HIP ARTHROPLASTY   PREOPERATIVE DIAGNOSIS: Osteoarthritis of the Right hip.   POSTOPERATIVE DIAGNOSIS: Osteoarthritis of the Right  hip.   PROCEDURE: Right total hip arthroplasty, anterior approach.   SURGEON: Gaynelle Arabian, MD   ASSISTANT: Theresa Duty, PA-C  ANESTHESIA:  Spinal  ESTIMATED BLOOD LOSS:-250 mL    DRAINS: Hemovac x1.   COMPLICATIONS: None   CONDITION: PACU - hemodynamically stable.   BRIEF CLINICAL NOTE: Jackson Powers is a 72 y.o. male who has advanced end-  stage arthritis of their Right hip with progressively worsening pain and  dysfunction.The patient has failed nonoperative management and presents for  total hip arthroplasty.   PROCEDURE IN DETAIL: After successful administration of spinal  anesthetic, the traction boots for the [facility] bed were placed on both  feet and the patient was placed onto the [facility] bed, boots placed into the leg  holders. The Right hip was then isolated from the perineum with plastic  drapes and prepped and draped in the usual sterile fashion. ASIS and  greater trochanter were marked and a oblique incision was made, starting  at about 1 cm lateral and 2 cm distal to the ASIS and coursing towards  the anterior cortex of the femur. The skin was cut with a 10 blade  through subcutaneous tissue to the level of the fascia overlying the  tensor fascia lata muscle. The fascia was then incised in line with the  incision at the junction of the anterior third and posterior 2/3rd. The  muscle was teased off the fascia and then the interval between the TFL  and the rectus was developed. The Hohmann retractor was then placed at  the top of the femoral neck over the capsule. The vessels overlying the  capsule were cauterized and the fat on top of the capsule was removed.  A Hohmann retractor was then placed anterior underneath the rectus  femoris to give exposure to the entire anterior capsule. A T-shaped   capsulotomy was performed. The edges were tagged and the femoral head  was identified.       Osteophytes are removed off the superior acetabulum.  The femoral neck was then cut in situ with an oscillating saw. Traction  was then applied to the left lower extremity utilizing the [facility]  traction. The femoral head was then removed. Retractors were placed  around the acetabulum and then circumferential removal of the labrum was  performed. Osteophytes were also removed. Reaming starts at 51 mm to  medialize and  Increased in 2 mm increments to 55 mm. We reamed in  approximately 40 degrees of abduction, 20 degrees anteversion. A 56 mm  pinnacle acetabular shell was then impacted in anatomic position under  fluoroscopic guidance with excellent purchase. We did not need to place  any additional dome screws. A 36 mm neutral + 4 marathon liner was then  placed into the acetabular shell.       The femoral lift was then placed along the lateral aspect of the femur  just distal to the vastus ridge. The leg was  externally rotated and capsule  was stripped off the inferior aspect of the femoral neck down to the  level of the lesser trochanter, this was done with electrocautery. The femur was lifted after this was performed. The  leg was then placed in an extended and adducted position essentially delivering the femur. We also removed the capsule superiorly and the piriformis from the piriformis fossa to  gain excellent exposure of the  proximal femur. Rongeur was used to remove some cancellous bone to get  into the lateral portion of the proximal femur for placement of the  initial starter reamer. The starter broaches was placed  the starter broach  and was shown to go down the center of the canal. Broaching  with the Actis system was then performed starting at size 0  coursing  Up to size 9. A size 9 had excellent torsional and rotational  and axial stability. The trial high offset neck was then placed   with a 36 + 1.5 trial head. The hip was then reduced. We confirmed that  the stem was in the canal both on AP and lateral x-rays. It also has excellent sizing. The hip was reduced with outstanding stability through full extension and full external rotation.. AP pelvis was taken and the leg lengths were measured and found to be equal. Hip was then dislocated again and the femoral head and neck removed. The  femoral broach was removed. Size 9 Actis stem with a high offset  neck was then impacted into the femur following native anteversion. Has  excellent purchase in the canal. Excellent torsional and rotational and  axial stability. It is confirmed to be in the canal on AP and lateral  fluoroscopic views. The 36 + 1.5 ceramic head was placed and the hip  reduced with outstanding stability. Again AP pelvis was taken and it  confirmed that the leg lengths were equal. The wound was then copiously  irrigated with saline solution and the capsule reattached and repaired  with Ethibond suture. 30 ml of .25% Bupivicaine was  injected into the capsule and into the edge of the tensor fascia lata as well as subcutaneous tissue. The fascia overlying the tensor fascia lata was then closed with a running #1 V-Loc. Subcu was closed with interrupted 2-0 Vicryl and subcuticular running 4-0 Monocryl. Incision was cleaned  and dried. Steri-Strips and a bulky sterile dressing applied. Hemovac  drain was hooked to suction and then the patient was awakened and transported to  recovery in stable condition.        Please note that a surgical assistant was a medical necessity for this procedure to perform it in a safe and expeditious manner. Assistant was necessary to provide appropriate retraction of vital neurovascular structures and to prevent femoral fracture and allow for anatomic placement of the prosthesis.  Gaynelle Arabian, M.D.

## 2018-07-20 NOTE — Discharge Instructions (Signed)
°Dr. Frank Aluisio °Total Joint Specialist °[facility] °[address] °[place], [place] [address] °[phone] ° °ANTERIOR APPROACH TOTAL HIP REPLACEMENT POSTOPERATIVE DIRECTIONS ° ° °Hip Rehabilitation, Guidelines Following Surgery  °The results of a hip operation are greatly improved after range of motion and muscle strengthening exercises. Follow all safety measures which are given to protect your hip. If any of these exercises cause increased pain or swelling in your joint, decrease the amount until you are comfortable again. Then slowly increase the exercises. Call your caregiver if you have problems or questions.  ° °HOME CARE INSTRUCTIONS  °• Remove items at home which could result in a fall. This includes throw rugs or furniture in walking pathways.  °· ICE to the affected hip every three hours for 30 minutes at a time and then as needed for pain and swelling.  Continue to use ice on the hip for pain and swelling from surgery. You may notice swelling that will progress down to the foot and ankle.  This is normal after surgery.  Elevate the leg when you are not up walking on it.   °· Continue to use the breathing machine which will help keep your temperature down.  It is common for your temperature to cycle up and down following surgery, especially at night when you are not up moving around and exerting yourself.  The breathing machine keeps your lungs expanded and your temperature down. ° °DIET °You may resume your previous home diet once your are discharged from the hospital. ° °DRESSING / WOUND CARE / SHOWERING °You may shower 3 days after surgery, but keep the wounds dry during showering.  You may use an occlusive plastic wrap (Press'n Seal for example), NO SOAKING/SUBMERGING IN THE BATHTUB.  If the bandage gets wet, change with a clean dry gauze.  If the incision gets wet, pat the wound dry with a clean towel. °You may start showering once you are discharged home but do not submerge the  incision under water. Just pat the incision dry and apply a dry gauze dressing on daily. °Change the surgical dressing daily and reapply a dry dressing each time. ° °ACTIVITY °Walk with your walker as instructed. °Use walker as long as suggested by your caregivers. °Avoid periods of inactivity such as sitting longer than an hour when not asleep. This helps prevent blood clots.  °You may resume a sexual relationship in one month or when given the OK by your doctor.  °You may return to work once you are cleared by your doctor.  °Do not drive a car for 6 weeks or until released by you surgeon.  °Do not drive while taking narcotics. ° °WEIGHT BEARING °Weight bearing as tolerated with assist device (walker, cane, etc) as directed, use it as long as suggested by your surgeon or therapist, typically at least 4-6 weeks. ° °POSTOPERATIVE CONSTIPATION PROTOCOL °Constipation - defined medically as fewer than three stools per week and severe constipation as less than one stool per week. ° °One of the most common issues patients have following surgery is constipation.  Even if you have a regular bowel pattern at home, your normal regimen is likely to be disrupted due to multiple reasons following surgery.  Combination of anesthesia, postoperative narcotics, change in appetite and fluid intake all can affect your bowels.  In order to avoid complications following surgery, here are some recommendations in order to help you during your recovery period. ° °Colace (docusate) - Pick up an over-the-counter form   of Colace or another stool softener and take twice a day as long as you are requiring postoperative pain medications.  Take with a full glass of water daily.  If you experience loose stools or diarrhea, hold the colace until you stool forms back up.  If your symptoms do not get better within 1 week or if they get worse, check with your doctor. ° °Dulcolax (bisacodyl) - Pick up over-the-counter and take as directed by the product  packaging as needed to assist with the movement of your bowels.  Take with a full glass of water.  Use this product as needed if not relieved by Colace only.  ° °MiraLax (polyethylene glycol) - Pick up over-the-counter to have on hand.  MiraLax is a solution that will increase the amount of water in your bowels to assist with bowel movements.  Take as directed and can mix with a glass of water, juice, soda, coffee, or tea.  Take if you go more than two days without a movement. °Do not use MiraLax more than once per day. Call your doctor if you are still constipated or irregular after using this medication for 7 days in a row. ° °If you continue to have problems with postoperative constipation, please contact the office for further assistance and recommendations.  If you experience "the worst abdominal pain ever" or develop nausea or vomiting, please contact the office immediatly for further recommendations for treatment. ° °ITCHING ° If you experience itching with your medications, try taking only a single pain pill, or even half a pain pill at a time.  You can also use Benadryl over the counter for itching or also to help with sleep.  ° °TED HOSE STOCKINGS °Wear the elastic stockings on both legs for three weeks following surgery during the day but you may remove then at night for sleeping. ° °MEDICATIONS °See your medication summary on the “After Visit Summary” that the nursing staff will review with you prior to discharge.  You may have some home medications which will be placed on hold until you complete the course of blood thinner medication.  It is important for you to complete the blood thinner medication as prescribed by your surgeon.  Continue your approved medications as instructed at time of discharge. ° °PRECAUTIONS °If you experience chest pain or shortness of breath - call 911 immediately for transfer to the hospital emergency department.  °If you develop a fever greater that 101 F, purulent drainage  from wound, increased redness or drainage from wound, foul odor from the wound/dressing, or calf pain - CONTACT YOUR SURGEON.   °                                                °FOLLOW-UP APPOINTMENTS °Make sure you keep all of your appointments after your operation with your surgeon and caregivers. You should call the office at the above phone number and make an appointment for approximately two weeks after the date of your surgery or on the date instructed by your surgeon outlined in the "After Visit Summary". ° °RANGE OF MOTION AND STRENGTHENING EXERCISES  °These exercises are designed to help you keep full movement of your hip joint. Follow your caregiver's or physical therapist's instructions. Perform all exercises about fifteen times, three times per day or as directed. Exercise both hips, even if you have   had only one joint replacement. These exercises can be done on a training (exercise) mat, on the floor, on a table or on a bed. Use whatever works the best and is most comfortable for you. Use music or television while you are exercising so that the exercises are a pleasant break in your day. This will make your life better with the exercises acting as a break in routine you can look forward to.  °• Lying on your back, slowly slide your foot toward your buttocks, raising your knee up off the floor. Then slowly slide your foot back down until your leg is straight again.  °• Lying on your back spread your legs as far apart as you can without causing discomfort.  °• Lying on your side, raise your upper leg and foot straight up from the floor as far as is comfortable. Slowly lower the leg and repeat.  °• Lying on your back, tighten up the muscle in the front of your thigh (quadriceps muscles). You can do this by keeping your leg straight and trying to raise your heel off the floor. This helps strengthen the largest muscle supporting your knee.  °• Lying on your back, tighten up the muscles of your buttocks both  with the legs straight and with the knee bent at a comfortable angle while keeping your heel on the floor.  ° °IF YOU ARE TRANSFERRED TO A SKILLED REHAB FACILITY °If the patient is transferred to a skilled rehab facility following release from the hospital, a list of the current medications will be sent to the facility for the patient to continue.  When discharged from the skilled rehab facility, please have the facility set up the patient's Home Health Physical Therapy prior to being released. Also, the skilled facility will be responsible for providing the patient with their medications at time of release from the facility to include their pain medication, the muscle relaxants, and their blood thinner medication. If the patient is still at the rehab facility at time of the two week follow up appointment, the skilled rehab facility will also need to assist the patient in arranging follow up appointment in our office and any transportation needs. ° °MAKE SURE YOU:  °• Understand these instructions.  °• Get help right away if you are not doing well or get worse.  ° ° °Pick up stool softner and laxative for home use following surgery while on pain medications. °Do not submerge incision under water. °Please use good hand washing techniques while changing dressing each day. °May shower starting three days after surgery. °Please use a clean towel to pat the incision dry following showers. °Continue to use ice for pain and swelling after surgery. °Do not use any lotions or creams on the incision until instructed by your surgeon. ° °

## 2018-07-20 NOTE — Evaluation (Signed)
Physical Therapy Evaluation Patient Details Name: Jackson Powers MRN: [id number] DOB: 13-Sep-1946 Today's Date: 07/20/2018   History of Present Illness  Pt is a 72 YO male s/p R DA-THA on 9/4. PMH includes panic anxiety syndrome, orthopnea, HTN, OA. Past surgical history includes skin cancer excision 2017.   Clinical Impression   Pt s/p R DA-THA. Pt presents with R hip pain, difficulty performing bed mobility/transfers/ambulation, and decreased activity tolerance. Pt also with high BP this session, as pt's BP was 175/90 sitting EOB. Pt ambulated 40 ft with RW with min guard assist. PT to progress mobility as able. Pt requesting HHPT at this time, and would benefit to return to PLOF. Will continue to follow acutely.     Follow Up Recommendations Home health PT;Supervision for mobility/OOB(Pt requesting HHPT at this time to return to PLOF, pt would benefit)    Equipment Recommendations  None recommended by PT    Recommendations for Other Services       Precautions / Restrictions Precautions Precautions: Fall Restrictions Weight Bearing Restrictions: No Other Position/Activity Restrictions: WBAT       Mobility  Bed Mobility Overal bed mobility: Needs Assistance Bed Mobility: Supine to Sit     Supine to sit: Min assist     General bed mobility comments: Min assist for RLE mangement and trunk elevation. Increased time to scoot to EOB.   Transfers Overall transfer level: Needs assistance Equipment used: Rolling walker (2 wheeled) Transfers: Sit to/from Stand Sit to Stand: Min assist         General transfer comment: Min assist for power up. Increased time and effort to perform. Verbal cuing for hand placement.   Ambulation/Gait Ambulation/Gait assistance: Min guard;+2 safety/equipment(chair follow) Gait Distance (Feet): 40 Feet Assistive device: Rolling walker (2 wheeled) Gait Pattern/deviations: Step-to pattern;Step-through pattern;Decreased stride  length;Antalgic;Decreased weight shift to right;Decreased stance time - right Gait velocity: decr   General Gait Details: Min guard for safety. Verbal cuing for sequencing.   Stairs            Wheelchair Mobility    Modified Rankin (Stroke Patients Only)       Balance Overall balance assessment: Mild deficits observed, not formally tested                                           Pertinent Vitals/Pain Pain Assessment: 0-10 Pain Score: 5  Pain Location: R hip  Pain Descriptors / Indicators: Sore;Operative site guarding Pain Intervention(s): Limited activity within patient's tolerance;Ice applied;Monitored during session;Premedicated before session    Home Living Family/patient expects to be discharged to:: Private residence Living Arrangements: Spouse/significant other Available Help at Discharge: Family;Available 24 hours/day Type of Home: House Home Access: Stairs to enter Entrance Stairs-Rails: None Entrance Stairs-Number of Steps: 1 4 inch step Home Layout: One level Home Equipment: Cane - single point;Toilet riser;Walker - standard      Prior Function Level of Independence: Independent               Hand Dominance   Dominant Hand: Right    Extremity/Trunk Assessment   Upper Extremity Assessment Upper Extremity Assessment: Overall WFL for tasks assessed    Lower Extremity Assessment Lower Extremity Assessment: Overall WFL for tasks assessed;RLE deficits/detail RLE Deficits / Details: suspected post-surgical hip weakness; able to perform quad sets x5, heel slides with active assist x5 RLE Sensation: WNL  Communication   Communication: No difficulties  Cognition Arousal/Alertness: Awake/alert Behavior During Therapy: WFL for tasks assessed/performed Overall Cognitive Status: Within Functional Limits for tasks assessed                                        General Comments      Exercises Total  Joint Exercises Ankle Circles/Pumps: AROM;Both;10 reps;Seated Quad Sets: AROM;Right;5 reps;Supine Heel Slides: AAROM;Left;5 reps;Supine   Assessment/Plan    PT Assessment Patient needs continued PT services  PT Problem List Decreased strength;Pain;Decreased range of motion;Decreased activity tolerance;Decreased balance;Decreased mobility;Decreased knowledge of use of DME       PT Treatment Interventions DME instruction;Therapeutic activities;Gait training;Therapeutic exercise;Patient/family education;Stair training;Balance training;Functional mobility training    PT Goals (Current goals can be found in the Care Plan section)  Acute Rehab PT Goals PT Goal Formulation: With patient Time For Goal Achievement: 08/03/18 Potential to Achieve Goals: Good    Frequency 7X/week   Barriers to discharge        Co-evaluation               AM-PAC PT "6 Clicks" Daily Activity  Outcome Measure Difficulty turning over in bed (including adjusting bedclothes, sheets and blankets)?: Unable Difficulty moving from lying on back to sitting on the side of the bed? : Unable Difficulty sitting down on and standing up from a chair with arms (e.g., wheelchair, bedside commode, etc,.)?: Unable Help needed moving to and from a bed to chair (including a wheelchair)?: A Little Help needed walking in hospital room?: A Little Help needed climbing 3-5 steps with a railing? : A Lot 6 Click Score: 11    End of Session Equipment Utilized During Treatment: Gait belt Activity Tolerance: Patient tolerated treatment well;No increased pain Patient left: in chair;with chair alarm set;with call bell/phone within reach;with family/visitor present Nurse Communication: Mobility status PT Visit Diagnosis: Other abnormalities of gait and mobility (R26.89);Difficulty in walking, not elsewhere classified (R26.2)    Time: 2025-4270 PT Time Calculation (min) (ACUTE ONLY): 36 min   Charges:   PT Evaluation $PT  Eval Low Complexity: 1 Low PT Treatments $Gait Training: 8-22 mins       Muneer Leider Conception Chancy, PT, DPT  Pager # [phone]   Minsa Weddington D Ebonee Stober 07/20/2018, 7:25 PM

## 2018-07-20 NOTE — Anesthesia Procedure Notes (Signed)
Spinal  Patient location during procedure: OR Start time: 07/20/2018 12:30 PM End time: 07/20/2018 12:34 PM Staffing Anesthesiologist: Catalina Gravel, MD Performed: anesthesiologist  Preanesthetic Checklist Completed: patient identified, surgical consent, pre-op evaluation, timeout performed, IV checked, risks and benefits discussed and monitors and equipment checked Spinal Block Patient position: sitting Prep: site prepped and draped and DuraPrep Patient monitoring: continuous pulse ox and blood pressure Approach: midline Location: L3-4 Injection technique: single-shot Needle Needle type: Pencan  Needle gauge: 24 G Assessment Sensory level: T8 Additional Notes Attempt x2 by CRNA.  Attempt x1 by MDA with +CSF return.  Freely aspirated CSF prior to injection of local anesthesia.  Patient returned to supine position.  Tolerated procedure well.  Collins Scotland, MD

## 2018-07-20 NOTE — Anesthesia Preprocedure Evaluation (Addendum)
Anesthesia Evaluation  Patient identified by MRN, date of birth, ID band Patient awake    Reviewed: Allergy & Precautions, NPO status , Patient's Chart, lab work & pertinent test results  Airway Mallampati: II  TM Distance: >3 FB Neck ROM: Full    Dental  (+) Dental Advisory Given, Edentulous Lower, Edentulous Upper   Pulmonary former smoker,    Pulmonary exam normal breath sounds clear to auscultation       Cardiovascular hypertension, Pt. on medications Normal cardiovascular exam+ dysrhythmias (RBBB)  Rhythm:Regular Rate:Normal     Neuro/Psych PSYCHIATRIC DISORDERS Anxiety negative neurological ROS     GI/Hepatic Neg liver ROS, GERD  ,  Endo/Other  Obesity   Renal/GU negative Renal ROS     Musculoskeletal  (+) Arthritis , Osteoarthritis,    Abdominal   Peds  Hematology negative hematology ROS (+)   Anesthesia Other Findings Day of surgery medications reviewed with the patient.  Reproductive/Obstetrics                            Anesthesia Physical Anesthesia Plan  ASA: II  Anesthesia Plan: Spinal   Post-op Pain Management:    Induction:   PONV Risk Score and Plan: 2 and Dexamethasone, Ondansetron and Midazolam  Airway Management Planned: Natural Airway and Simple Face Mask  Additional Equipment:   Intra-op Plan:   Post-operative Plan:   Informed Consent: I have reviewed the patients History and Physical, chart, labs and discussed the procedure including the risks, benefits and alternatives for the proposed anesthesia with the patient or authorized representative who has indicated his/her understanding and acceptance.   Dental advisory given  Plan Discussed with: CRNA, Anesthesiologist and Surgeon  Anesthesia Plan Comments: (Discussed risks and benefits of and differences between spinal and general. Discussed risks of spinal including headache, backache, failure,  bleeding, infection, and nerve damage. Patient consents to spinal. Questions answered. Coagulation studies and platelet count acceptable.)        Anesthesia Quick Evaluation

## 2018-07-20 NOTE — Interval H&P Note (Signed)
History and Physical Interval Note:  07/20/2018 11:04 AM  Jackson Powers  has presented today for surgery, with the diagnosis of right hip osteoarthritis  The various methods of treatment have been discussed with the patient and family. After consideration of risks, benefits and other options for treatment, the patient has consented to  Procedure(s): RIGHT TOTAL HIP ARTHROPLASTY ANTERIOR APPROACH (Right) as a surgical intervention .  The patient's history has been reviewed, patient examined, no change in status, stable for surgery.  I have reviewed the patient's chart and labs.  Questions were answered to the patient's satisfaction.     Pilar Plate Melynda Krzywicki

## 2018-07-21 ENCOUNTER — Encounter (HOSPITAL_COMMUNITY): Payer: Self-pay | Admitting: Orthopedic Surgery

## 2018-07-21 LAB — CBC
HCT: 40.6 % (ref 39.0–52.0)
Hemoglobin: 13.8 g/dL (ref 13.0–17.0)
MCH: 30.7 pg (ref 26.0–34.0)
MCHC: 34 g/dL (ref 30.0–36.0)
MCV: 90.4 fL (ref 78.0–100.0)
PLATELETS: 229 10*3/uL (ref 150–400)
RBC: 4.49 MIL/uL (ref 4.22–5.81)
RDW: 13.2 % (ref 11.5–15.5)
WBC: 17.1 10*3/uL — ABNORMAL HIGH (ref 4.0–10.5)

## 2018-07-21 LAB — BASIC METABOLIC PANEL
Anion gap: 9 (ref 5–15)
BUN: 17 mg/dL (ref 8–23)
CALCIUM: 8.7 mg/dL — AB (ref 8.9–10.3)
CO2: 25 mmol/L (ref 22–32)
Chloride: 106 mmol/L (ref 98–111)
Creatinine, Ser: 0.81 mg/dL (ref 0.61–1.24)
GFR calc Af Amer: >60 mL/min (ref >60)
GFR calc non Af Amer: >60 mL/min (ref >60)
GLUCOSE: 150 mg/dL — AB (ref 70–99)
Potassium: 4 mmol/L (ref 3.5–5.1)
SODIUM: 140 mmol/L (ref 135–145)

## 2018-07-21 MED ORDER — HYDROCODONE-ACETAMINOPHEN 5-325 MG PO TABS: 1.0000 | ORAL_TABLET | Freq: Four times a day (QID) | ORAL | 0 refills | Status: DC | PRN

## 2018-07-21 MED ORDER — TRAMADOL HCL 50 MG PO TABS: 50.0000 mg | ORAL_TABLET | Freq: Four times a day (QID) | ORAL | 0 refills | Status: DC | PRN

## 2018-07-21 MED ORDER — ASPIRIN 325 MG PO TBEC: 325.0000 mg | DELAYED_RELEASE_TABLET | Freq: Two times a day (BID) | ORAL | 0 refills | Status: AC

## 2018-07-21 MED ORDER — METHOCARBAMOL 500 MG PO TABS: 500.0000 mg | ORAL_TABLET | Freq: Four times a day (QID) | ORAL | 0 refills | Status: DC | PRN

## 2018-07-21 NOTE — Progress Notes (Signed)
Physical Therapy Treatment Patient Details Name: Jackson Powers MRN: [id number] DOB: 1946/05/15 Today's Date: 07/21/2018    History of Present Illness Pt is a 72 YO male s/p R DA-THA on 9/4. PMH includes panic anxiety syndrome, orthopnea, HTN, OA.     PT Comments    Pt ambulated in hallway and practiced safe step technique.  Pt also performed LE exercises and provided with HEP handout.  Daughter arrived near end of session with borrowed SW with missing rubber piece on bottom so recommended obtaining RW for d/c home (CM in room and aware).   Follow Up Recommendations  Home health PT;Supervision for mobility/OOB(pt requests HHPT, provided with HEP)     Equipment Recommendations  Rolling walker with 5" wheels    Recommendations for Other Services       Precautions / Restrictions Precautions Precautions: Fall Restrictions Other Position/Activity Restrictions: WBAT     Mobility  Bed Mobility Overal bed mobility: Needs Assistance Bed Mobility: Supine to Sit     Supine to sit: Min guard     General bed mobility comments: min/guard for safety, increased time however no assist required  Transfers Overall transfer level: Needs assistance Equipment used: Rolling walker (2 wheeled) Transfers: Sit to/from Stand Sit to Stand: Min guard         General transfer comment: verbal cues for hand placement  Ambulation/Gait Ambulation/Gait assistance: Min guard Gait Distance (Feet): 200 Feet Assistive device: Rolling walker (2 wheeled) Gait Pattern/deviations: Step-through pattern;Antalgic;Decreased stance time - right     General Gait Details: min/guard for safety, verbal cue for RW positioning   Stairs Stairs: Yes Stairs assistance: Min guard Stair Management: Step to pattern;Backwards;With walker Number of Stairs: 1 General stair comments: verbal cues for safety and sequence, pt reports understanding, also verbally reviewed with daughter (she arrived later)   Wheelchair  Mobility    Modified Rankin (Stroke Patients Only)       Balance                                            Cognition Arousal/Alertness: Awake/alert Behavior During Therapy: WFL for tasks assessed/performed Overall Cognitive Status: Within Functional Limits for tasks assessed                                        Exercises Total Joint Exercises Ankle Circles/Pumps: AROM;Both;10 reps;Seated Quad Sets: AROM;Right;5 reps;Supine Heel Slides: AAROM;Supine;10 reps;Right Hip ABduction/ADduction: AROM;10 reps;Right;Standing;Supine Long Arc Quad: AROM;10 reps;Right;Seated Knee Flexion: AROM;10 reps;Standing;Right Marching in Standing: AROM;10 reps;Right;Standing Standing Hip Extension: AROM;10 reps;Right;Standing    General Comments        Pertinent Vitals/Pain Pain Assessment: 0-10 Pain Score: 2  Pain Location: R hip  Pain Descriptors / Indicators: Aching;Sore Pain Intervention(s): Limited activity within patient's tolerance;Repositioned;Monitored during session;Ice applied    Home Living                      Prior Function            PT Goals (current goals can now be found in the care plan section) Progress towards PT goals: Progressing toward goals    Frequency    7X/week      PT Plan Current plan remains appropriate;Equipment recommendations need to be updated    Co-evaluation  AM-PAC PT "6 Clicks" Daily Activity  Outcome Measure  Difficulty turning over in bed (including adjusting bedclothes, sheets and blankets)?: A Lot Difficulty moving from lying on back to sitting on the side of the bed? : A Lot Difficulty sitting down on and standing up from a chair with arms (e.g., wheelchair, bedside commode, etc,.)?: A Lot Help needed moving to and from a bed to chair (including a wheelchair)?: A Little Help needed walking in hospital room?: A Little Help needed climbing 3-5 steps with a railing? :  A Little 6 Click Score: 15    End of Session Equipment Utilized During Treatment: Gait belt Activity Tolerance: Patient tolerated treatment well Patient left: in chair;with call bell/phone within reach;with family/visitor present Nurse Communication: Mobility status PT Visit Diagnosis: Other abnormalities of gait and mobility (R26.89);Difficulty in walking, not elsewhere classified (R26.2)     Time: 5686-1683 PT Time Calculation (min) (ACUTE ONLY): 24 min  Charges:  $Gait Training: 8-22 mins $Therapeutic Exercise: 8-22 mins                    Carmelia Bake, PT, DPT 07/21/2018 Pager: [phone]   York Ram E 07/21/2018, 12:39 PM

## 2018-07-21 NOTE — Progress Notes (Signed)
   Subjective: 1 Day Post-Op Procedure(s) (LRB): RIGHT TOTAL HIP ARTHROPLASTY ANTERIOR APPROACH (Right) Patient reports pain as mild.   Patient seen in rounds with Dr. Wynelle Link. Patient is well, and has had no acute complaints or problems other than some discomfort in the right hip. Foley catheter removed this AM. Denies chest pain or SOB.  We will continue therapy today.   Objective: Vital signs in last 24 hours: Temp:  [97.4 F (36.3 C)-98.1 F (36.7 C)] 97.6 F (36.4 C) (09/05 0611) Pulse Rate:  [63-91] 84 (09/05 0611) Resp:  [16-20] 17 (09/05 0611) BP: (119-180)/(62-93) 150/73 (09/05 0611) SpO2:  [94 %-100 %] 98 % (09/05 0611) Weight:  [111.1 kg] 111.1 kg (09/04 1116)  Intake/Output from previous day:  Intake/Output Summary (Last 24 hours) at 07/21/2018 0809 Last data filed at 07/21/2018 0600 Gross per 24 hour  Intake 4455.35 ml  Output 3770 ml  Net 685.35 ml     Labs: Recent Labs    07/21/18 0521  HGB 13.8   Recent Labs    07/21/18 0521  WBC 17.1*  RBC 4.49  HCT 40.6  PLT 229   Recent Labs    07/21/18 0521  NA 140  K 4.0  CL 106  CO2 25  BUN 17  CREATININE 0.81  GLUCOSE 150*  CALCIUM 8.7*   Exam: General - Patient is Alert and Oriented Extremity - Neurologically intact Neurovascular intact Sensation intact distally Dorsiflexion/Plantar flexion intact Dressing - dressing C/D/I Motor Function - intact, moving foot and toes well on exam.   Past Medical History:  Diagnosis Date  . Acute gastritis without hemorrhage 2007  . Arthritis    osteoarthritis   . Benign neoplasm of colon 2007  . Esophagitis 2007  . Hypertension   . Orthopnea    reports " i have trouble breathing when i lie completely flat. i have to use 2 people to prop my head up"   . Panic anxiety syndrome    patient reports having severe panic anxiety and claustraphobia. states with ear surgery years ago , they had to tie him down which caused him to panic even more;   . Right  BBB/left ant fasc block    (OLD)    Assessment/Plan: 1 Day Post-Op Procedure(s) (LRB): RIGHT TOTAL HIP ARTHROPLASTY ANTERIOR APPROACH (Right) Principal Problem:   OA (osteoarthritis) of hip  Estimated body mass index is 30.62 kg/m as calculated from the following:   Height as of this encounter: 6\' 3"  (1.905 m).   Weight as of this encounter: 111.1 kg. Advance diet Up with therapy D/C IV fluids  DVT Prophylaxis - Aspirin Weight bearing as tolerated. D/C O2 and pulse ox and try on room air. Hemovac pulled without difficulty, will continue working with therapy.  Plan is to go Home after hospital stay. Possible discharge with HHPT this afternoon if progresses with therapy and meeting goals. Follow-up in the office in 2 weeks with Dr. Wynelle Link.   Theresa Duty, PA-C Orthopedic Surgery 07/21/2018, 8:09 AM

## 2018-07-21 NOTE — Care Management (Signed)
HH orders received. [facility] chosen for [facility] and rep alerted of referral. 3in1 and RW ordered and [facility] rep alerted. Marney Doctor RN,BSN [phone]

## 2018-07-22 DIAGNOSIS — Z96641 Presence of right artificial hip joint: Secondary | ICD-10-CM | POA: Diagnosis not present

## 2018-07-23 ENCOUNTER — Encounter (HOSPITAL_COMMUNITY): Payer: Self-pay | Admitting: Orthopedic Surgery

## 2018-07-23 NOTE — Transfer of Care (Signed)
Immediate Anesthesia Transfer of Care Note  Patient: Jackson Powers  Procedure(s) Performed: RIGHT TOTAL HIP ARTHROPLASTY ANTERIOR APPROACH (Right Hip)  Patient Location: PACU   Anesthesia Type:Spinal   Level of Consciousness: Drowsy  Airway & Oxygen Therapy: Facemask oxygen  Post-op Assessment: Vital signs stable  Post vital signs: see chart  Last Vitals:  Vitals Value Taken Time  BP    Temp    Pulse    Resp    SpO2      Last Pain:  Vitals:   07/21/18 0939  TempSrc: Oral  PainSc:       Patients Stated Pain Goal: 3 (41/28/78 6767)  Complications: No complications

## 2018-07-25 DIAGNOSIS — Z87891 Personal history of nicotine dependence: Secondary | ICD-10-CM | POA: Diagnosis not present

## 2018-07-25 DIAGNOSIS — Z7982 Long term (current) use of aspirin: Secondary | ICD-10-CM | POA: Diagnosis not present

## 2018-07-25 DIAGNOSIS — Z96641 Presence of right artificial hip joint: Secondary | ICD-10-CM | POA: Diagnosis not present

## 2018-07-25 DIAGNOSIS — Z9181 History of falling: Secondary | ICD-10-CM | POA: Diagnosis not present

## 2018-07-25 DIAGNOSIS — Z471 Aftercare following joint replacement surgery: Secondary | ICD-10-CM | POA: Diagnosis not present

## 2018-07-25 NOTE — Discharge Summary (Signed)
Physician Discharge Summary   Patient ID: Jackson Powers MRN: [id number] DOB/AGE: 07/28/1946 72 y.o.  Admit date: 07/20/2018 Discharge date: 07/21/2018  Primary Diagnosis: Osteoarthritis, right hip   Admission Diagnoses:  Past Medical History:  Diagnosis Date  . Acute gastritis without hemorrhage 2007  . Arthritis    osteoarthritis   . Benign neoplasm of colon 2007  . Esophagitis 2007  . Hypertension   . Orthopnea    reports " i have trouble breathing when i lie completely flat. i have to use 2 people to prop my head up"   . Panic anxiety syndrome    patient reports having severe panic anxiety and claustraphobia. states with ear surgery years ago , they had to tie him down which caused him to panic even more;   . Right BBB/left ant fasc block    (OLD)   Discharge Diagnoses:   Principal Problem:   OA (osteoarthritis) of hip  Estimated body mass index is 30.62 kg/m as calculated from the following:   Height as of this encounter: '6\' 3"'$  (1.905 m).   Weight as of this encounter: 111.1 kg.  Procedure:  Procedure(s) (LRB): RIGHT TOTAL HIP ARTHROPLASTY ANTERIOR APPROACH (Right)   Consults: None  HPI: Jackson Powers is a 72 y.o. male who has advanced end-stage arthritis of their Right hip with progressively worsening pain and dysfunction.The patient has failed nonoperative management and presents for total hip arthroplasty.   Laboratory Data: Admission on 07/20/2018, Discharged on 07/21/2018  Component Date Value Ref Range Status  . WBC 07/21/2018 17.1* 4.0 - 10.5 K/uL Final  . RBC 07/21/2018 4.49  4.22 - 5.81 MIL/uL Final  . Hemoglobin 07/21/2018 13.8  13.0 - 17.0 g/dL Final  . HCT 07/21/2018 40.6  39.0 - 52.0 % Final  . MCV 07/21/2018 90.4  78.0 - 100.0 fL Final  . MCH 07/21/2018 30.7  26.0 - 34.0 pg Final  . MCHC 07/21/2018 34.0  30.0 - 36.0 g/dL Final  . RDW 07/21/2018 13.2  11.5 - 15.5 % Final  . Platelets 07/21/2018 229  150 - 400 K/uL Final   Performed at [facility], [place] [address], [place], [place] [address]  . Sodium 07/21/2018 140  135 - 145 mmol/L Final  . Potassium 07/21/2018 4.0  3.5 - 5.1 mmol/L Final  . Chloride 07/21/2018 106  98 - 111 mmol/L Final  . CO2 07/21/2018 25  22 - 32 mmol/L Final  . Glucose, Bld 07/21/2018 150* 70 - 99 mg/dL Final  . BUN 07/21/2018 17  8 - 23 mg/dL Final  . Creatinine, Ser 07/21/2018 0.81  0.61 - 1.24 mg/dL Final  . Calcium 07/21/2018 8.7* 8.9 - 10.3 mg/dL Final  . GFR calc non Af Amer 07/21/2018 >60  >60 mL/min Final  . GFR calc Af Amer 07/21/2018 >60  >60 mL/min Final   Comment: (NOTE) The eGFR has been calculated using the CKD EPI equation. This calculation has not been validated in all clinical situations. eGFR's persistently <60 mL/min signify possible Chronic Kidney Disease.   Georgiann Hahn gap 07/21/2018 9  5 - 15 Final   Performed at [facility], [place] [address] [place], [place] [address]  . Glucose-Capillary 07/20/2018 178* 70 - 99 mg/dL Final  . Comment 1 07/20/2018 Notify RN   Final  . Comment 2 07/20/2018 Document in Chart   Final  Hospital Outpatient Visit on 07/11/2018  Component Date Value Ref Range Status  . MRSA, PCR 07/11/2018 NEGATIVE  NEGATIVE  Final  . Staphylococcus aureus 07/11/2018 NEGATIVE  NEGATIVE Final   Comment: (NOTE) The Xpert SA Assay (FDA approved for NASAL specimens in patients 71 years of age and older), is one component of a comprehensive surveillance program. It is not intended to diagnose infection nor to guide or monitor treatment. Performed at [facility], [place] [address], [place], [place] [address]   . aPTT 07/11/2018 38* 24 - 36 seconds Final   Comment:        IF BASELINE aPTT IS ELEVATED, SUGGEST PATIENT RISK ASSESSMENT BE USED TO DETERMINE APPROPRIATE ANTICOAGULANT THERAPY. Performed at [facility], [place] [address] [place], [place] [address]   . WBC 07/11/2018 7.8  4.0 - 10.5 K/uL Final   . RBC 07/11/2018 4.86  4.22 - 5.81 MIL/uL Final  . Hemoglobin 07/11/2018 15.3  13.0 - 17.0 g/dL Final  . HCT 07/11/2018 43.4  39.0 - 52.0 % Final  . MCV 07/11/2018 89.3  78.0 - 100.0 fL Final  . MCH 07/11/2018 31.5  26.0 - 34.0 pg Final  . MCHC 07/11/2018 35.3  30.0 - 36.0 g/dL Final  . RDW 07/11/2018 13.1  11.5 - 15.5 % Final  . Platelets 07/11/2018 235  150 - 400 K/uL Final   Performed at [facility], [place] [address], [place], [place] [address]  . Sodium 07/11/2018 141  135 - 145 mmol/L Final  . Potassium 07/11/2018 3.9  3.5 - 5.1 mmol/L Final  . Chloride 07/11/2018 107  98 - 111 mmol/L Final  . CO2 07/11/2018 25  22 - 32 mmol/L Final  . Glucose, Bld 07/11/2018 107* 70 - 99 mg/dL Final  . BUN 07/11/2018 14  8 - 23 mg/dL Final  . Creatinine, Ser 07/11/2018 0.85  0.61 - 1.24 mg/dL Final  . Calcium 07/11/2018 9.5  8.9 - 10.3 mg/dL Final  . Total Protein 07/11/2018 7.5  6.5 - 8.1 g/dL Final  . Albumin 07/11/2018 4.5  3.5 - 5.0 g/dL Final  . AST 07/11/2018 28  15 - 41 U/L Final  . ALT 07/11/2018 35  0 - 44 U/L Final  . Alkaline Phosphatase 07/11/2018 33* 38 - 126 U/L Final  . Total Bilirubin 07/11/2018 0.6  0.3 - 1.2 mg/dL Final  . GFR calc non Af Amer 07/11/2018 >60  >60 mL/min Final  . GFR calc Af Amer 07/11/2018 >60  >60 mL/min Final   Comment: (NOTE) The eGFR has been calculated using the CKD EPI equation. This calculation has not been validated in all clinical situations. eGFR's persistently <60 mL/min signify possible Chronic Kidney Disease.   Georgiann Hahn gap 07/11/2018 9  5 - 15 Final   Performed at [facility], [place] [address], [place], [place] [address]  . Prothrombin Time 07/11/2018 13.1  11.4 - 15.2 seconds Final  . INR 07/11/2018 1.00   Final   Performed at [facility], [place] [address], [place], [place] [address]  . ABO/RH(D) 07/11/2018 A POS   Final  . Antibody Screen 07/11/2018 NEG   Final  . Sample Expiration  07/11/2018 07/23/2018   Final  . Extend sample reason 07/11/2018    Final                   Value:NO TRANSFUSIONS OR PREGNANCY IN THE PAST 3 MONTHS Performed at [facility], [place] [address] [place],  [address]   . ABO/RH(D) 07/11/2018    Final  Value:A POS Performed at [facility], [place] [address], [place], [place] [address]      X-Rays:Dg Pelvis Portable  Result Date: 07/20/2018 CLINICAL DATA:  Postop hip replacement EXAM: PORTABLE PELVIS 1-2 VIEWS COMPARISON:  02/17/2018 FINDINGS: Pubic symphysis is intact.  Left femoral head projects in joint. Interval right hip replacement with normal hardware alignment. Vascular calcification. Drainage catheter over the right hip. Soft tissue gas over the right hip. IMPRESSION: Status post right hip replacement with expected postsurgical changes Electronically Signed   By: Donavan Foil M.D.   On: 07/20/2018 14:50   Dg C-arm 1-60 Min-no Report  Result Date: 07/20/2018 Fluoroscopy was utilized by the requesting physician.  No radiographic interpretation.    EKG:No orders found for this or any previous visit.   Hospital Course: Jackson Powers is a 72 y.o. who was admitted to [facility]. They were brought to the operating room on 07/20/2018 and underwent Procedure(s): [place].  Patient tolerated the procedure well and was later transferred to the recovery room and then to the orthopaedic floor for postoperative care. They were given PO and IV analgesics for pain control following their surgery. They were given 24 hours of postoperative antibiotics of  Anti-infectives (From admission, onward)   Start     Dose/Rate Route Frequency Ordered Stop   07/20/18 1830  ceFAZolin (ANCEF) IVPB 2g/100 mL premix     2 g 200 mL/hr over 30 Minutes Intravenous Every 6 hours 07/20/18 1549 07/21/18 0414   07/20/18 1100  ceFAZolin (ANCEF) IVPB 2g/100 mL premix     2  g 200 mL/hr over 30 Minutes Intravenous On call to O.R. 07/20/18 1045 07/20/18 1240   07/20/18 1047  ceFAZolin (ANCEF) 2-4 GM/100ML-% IVPB    Note to Pharmacy:  Waldron Session   : cabinet override      07/20/18 1047 07/20/18 1235     and started on DVT prophylaxis in the form of Aspirin.   PT and OT were ordered for total joint protocol. Discharge planning consulted to help with postop disposition and equipment needs.  Patient had a good night on the evening of surgery. They started to get up OOB with therapy on POD #0. Pt was seen during rounds and was ready to go home pending progress with therapy. Hemovac drain was pulled without difficulty. He worked with therapy on POD #1 and was meeting his goals. Pt was discharged to home later that day in stable condition.  Diet: Regular diet Activity: WBAT Follow-up: in 2 weeks with Dr. Wynelle Link Disposition: Home with HHPT Discharged Condition: stable   Discharge Instructions    Call MD / Call 911   Complete by:  As directed    If you experience chest pain or shortness of breath, CALL 911 and be transported to the hospital emergency room.  If you develope a fever above 101 F, pus (white drainage) or increased drainage or redness at the wound, or calf pain, call your surgeon's office.   Change dressing   Complete by:  As directed    You may change your dressing on Friday (07/22/2018), then change the dressing daily with sterile 4 x 4 inch gauze dressing and paper tape.   Constipation Prevention   Complete by:  As directed    Drink plenty of fluids.  Prune juice may be helpful.  You may use a stool softener, such as Colace (over the counter) 100 mg twice a day.  Use MiraLax (over  the counter) for constipation as needed.   Diet - low sodium heart healthy   Complete by:  As directed    Discharge instructions   Complete by:  As directed    Dr. Gaynelle Arabian Total Joint Specialist [facility] [address], [place], [place]  [address] [phone]  ANTERIOR APPROACH TOTAL HIP REPLACEMENT POSTOPERATIVE DIRECTIONS   Hip Rehabilitation, Guidelines Following Surgery  The results of a hip operation are greatly improved after range of motion and muscle strengthening exercises. Follow all safety measures which are given to protect your hip. If any of these exercises cause increased pain or swelling in your joint, decrease the amount until you are comfortable again. Then slowly increase the exercises. Call your caregiver if you have problems or questions.   HOME CARE INSTRUCTIONS  Remove items at home which could result in a fall. This includes throw rugs or furniture in walking pathways.  ICE to the affected hip every three hours for 30 minutes at a time and then as needed for pain and swelling.  Continue to use ice on the hip for pain and swelling from surgery. You may notice swelling that will progress down to the foot and ankle.  This is normal after surgery.  Elevate the leg when you are not up walking on it.   Continue to use the breathing machine which will help keep your temperature down.  It is common for your temperature to cycle up and down following surgery, especially at night when you are not up moving around and exerting yourself.  The breathing machine keeps your lungs expanded and your temperature down.  DIET You may resume your previous home diet once your are discharged from the hospital.  DRESSING / WOUND CARE / SHOWERING You may shower 3 days after surgery, but keep the wounds dry during showering.  You may use an occlusive plastic wrap (Press'n Seal for example), NO SOAKING/SUBMERGING IN THE BATHTUB.  If the bandage gets wet, change with a clean dry gauze.  If the incision gets wet, pat the wound dry with a clean towel. You may start showering once you are discharged home but do not submerge the incision under water. Just pat the incision dry and apply a dry gauze dressing on daily. Change the surgical  dressing daily and reapply a dry dressing each time.  ACTIVITY Walk with your walker as instructed. Use walker as long as suggested by your caregivers. Avoid periods of inactivity such as sitting longer than an hour when not asleep. This helps prevent blood clots.  You may resume a sexual relationship in one month or when given the OK by your doctor.  You may return to work once you are cleared by your doctor.  Do not drive a car for 6 weeks or until released by you surgeon.  Do not drive while taking narcotics.  WEIGHT BEARING Weight bearing as tolerated with assist device (walker, cane, etc) as directed, use it as long as suggested by your surgeon or therapist, typically at least 4-6 weeks.  POSTOPERATIVE CONSTIPATION PROTOCOL Constipation - defined medically as fewer than three stools per week and severe constipation as less than one stool per week.  One of the most common issues patients have following surgery is constipation.  Even if you have a regular bowel pattern at home, your normal regimen is likely to be disrupted due to multiple reasons following surgery.  Combination of anesthesia, postoperative narcotics, change in appetite and fluid intake all  can affect your bowels.  In order to avoid complications following surgery, here are some recommendations in order to help you during your recovery period.  Colace (docusate) - Pick up an over-the-counter form of Colace or another stool softener and take twice a day as long as you are requiring postoperative pain medications.  Take with a full glass of water daily.  If you experience loose stools or diarrhea, hold the colace until you stool forms back up.  If your symptoms do not get better within 1 week or if they get worse, check with your doctor.  Dulcolax (bisacodyl) - Pick up over-the-counter and take as directed by the product packaging as needed to assist with the movement of your bowels.  Take with a full glass of water.  Use this  product as needed if not relieved by Colace only.   MiraLax (polyethylene glycol) - Pick up over-the-counter to have on hand.  MiraLax is a solution that will increase the amount of water in your bowels to assist with bowel movements.  Take as directed and can mix with a glass of water, juice, soda, coffee, or tea.  Take if you go more than two days without a movement. Do not use MiraLax more than once per day. Call your doctor if you are still constipated or irregular after using this medication for 7 days in a row.  If you continue to have problems with postoperative constipation, please contact the office for further assistance and recommendations.  If you experience "the worst abdominal pain ever" or develop nausea or vomiting, please contact the office immediatly for further recommendations for treatment.  ITCHING  If you experience itching with your medications, try taking only a single pain pill, or even half a pain pill at a time.  You can also use Benadryl over the counter for itching or also to help with sleep.   TED HOSE STOCKINGS Wear the elastic stockings on both legs for three weeks following surgery during the day but you may remove then at night for sleeping.  MEDICATIONS See your medication summary on the "After Visit Summary" that the nursing staff will review with you prior to discharge.  You may have some home medications which will be placed on hold until you complete the course of blood thinner medication.  It is important for you to complete the blood thinner medication as prescribed by your surgeon.  Continue your approved medications as instructed at time of discharge.  PRECAUTIONS If you experience chest pain or shortness of breath - call 911 immediately for transfer to the hospital emergency department.  If you develop a fever greater that 101 F, purulent drainage from wound, increased redness or drainage from wound, foul odor from the wound/dressing, or calf pain -  CONTACT YOUR SURGEON.                                                   FOLLOW-UP APPOINTMENTS Make sure you keep all of your appointments after your operation with your surgeon and caregivers. You should call the office at the above phone number and make an appointment for approximately two weeks after the date of your surgery or on the date instructed by your surgeon outlined in the "After Visit Summary".  RANGE OF MOTION AND STRENGTHENING EXERCISES  These exercises are designed to help you  keep full movement of your hip joint. Follow your caregiver's or physical therapist's instructions. Perform all exercises about fifteen times, three times per day or as directed. Exercise both hips, even if you have had only one joint replacement. These exercises can be done on a training (exercise) mat, on the floor, on a table or on a bed. Use whatever works the best and is most comfortable for you. Use music or television while you are exercising so that the exercises are a pleasant break in your day. This will make your life better with the exercises acting as a break in routine you can look forward to.  Lying on your back, slowly slide your foot toward your buttocks, raising your knee up off the floor. Then slowly slide your foot back down until your leg is straight again.  Lying on your back spread your legs as far apart as you can without causing discomfort.  Lying on your side, raise your upper leg and foot straight up from the floor as far as is comfortable. Slowly lower the leg and repeat.  Lying on your back, tighten up the muscle in the front of your thigh (quadriceps muscles). You can do this by keeping your leg straight and trying to raise your heel off the floor. This helps strengthen the largest muscle supporting your knee.  Lying on your back, tighten up the muscles of your buttocks both with the legs straight and with the knee bent at a comfortable angle while keeping your heel on the floor.   IF  YOU ARE TRANSFERRED TO A SKILLED REHAB FACILITY If the patient is transferred to a skilled rehab facility following release from the hospital, a list of the current medications will be sent to the facility for the patient to continue.  When discharged from the skilled rehab facility, please have the facility set up the patient's [place] prior to being released. Also, the skilled facility will be responsible for providing the patient with their medications at time of release from the facility to include their pain medication, the muscle relaxants, and their blood thinner medication. If the patient is still at the rehab facility at time of the two week follow up appointment, the skilled rehab facility will also need to assist the patient in arranging follow up appointment in our office and any transportation needs.  MAKE SURE YOU:  Understand these instructions.  Get help right away if you are not doing well or get worse.    Pick up stool softner and laxative for home use following surgery while on pain medications. Do not submerge incision under water. Please use good hand washing techniques while changing dressing each day. May shower starting three days after surgery. Please use a clean towel to pat the incision dry following showers. Continue to use ice for pain and swelling after surgery. Do not use any lotions or creams on the incision until instructed by your surgeon.   Do not sit on low chairs, stoools or toilet seats, as it may be difficult to get up from low surfaces   Complete by:  As directed    Driving restrictions   Complete by:  As directed    No driving for two weeks   TED hose   Complete by:  As directed    Use stockings (TED hose) for three weeks on both leg(s).  You may remove them at night for sleeping.   Weight bearing as tolerated   Complete by:  As directed      Allergies as of 07/21/2018   No Known Allergies     Medication List    TAKE these  medications   amLODipine 5 MG tablet Commonly known as:  NORVASC Take 5 mg by mouth daily.   aspirin 325 MG EC tablet Take 1 tablet (325 mg total) by mouth 2 (two) times daily for 20 days. Then resume one 81 mg aspirin once a day. What changed:    medication strength  how much to take  when to take this  additional instructions   docusate sodium 50 MG capsule Commonly known as:  COLACE Take 50 mg by mouth every evening.   HYDROcodone-acetaminophen 5-325 MG tablet Commonly known as:  NORCO/VICODIN Take 1-2 tablets by mouth every 6 (six) hours as needed for moderate pain (pain score 4-6).   methocarbamol 500 MG tablet Commonly known as:  ROBAXIN Take 1 tablet (500 mg total) by mouth every 6 (six) hours as needed for muscle spasms.   traMADol 50 MG tablet Commonly known as:  ULTRAM Take 1-2 tablets (50-100 mg total) by mouth every 6 (six) hours as needed for moderate pain (unresponsive to hydrocodone).            Discharge Care Instructions  (From admission, onward)         Start     Ordered   07/21/18 0000  Weight bearing as tolerated     07/21/18 0813   07/21/18 0000  Change dressing    Comments:  You may change your dressing on Friday (07/22/2018), then change the dressing daily with sterile 4 x 4 inch gauze dressing and paper tape.   07/21/18 0813         Follow-up Information    Gaynelle Arabian, MD. Schedule an appointment as soon as possible for a visit on 08/04/2018.   Specialty:  Orthopedic Surgery Contact information: [address] [place] [address] [phone]        [facility], [facility] Follow up.   Specialty:  [place]:  For home health physical therapy Contact information: [place] [address] [phone]           Signed: Theresa Duty, PA-C Orthopedic Surgery 07/25/2018, 11:17 AM

## 2018-07-26 DIAGNOSIS — Z9181 History of falling: Secondary | ICD-10-CM | POA: Diagnosis not present

## 2018-07-26 DIAGNOSIS — Z87891 Personal history of nicotine dependence: Secondary | ICD-10-CM | POA: Diagnosis not present

## 2018-07-26 DIAGNOSIS — Z7982 Long term (current) use of aspirin: Secondary | ICD-10-CM | POA: Diagnosis not present

## 2018-07-26 DIAGNOSIS — Z96641 Presence of right artificial hip joint: Secondary | ICD-10-CM | POA: Diagnosis not present

## 2018-07-26 DIAGNOSIS — Z471 Aftercare following joint replacement surgery: Secondary | ICD-10-CM | POA: Diagnosis not present

## 2018-07-28 DIAGNOSIS — Z87891 Personal history of nicotine dependence: Secondary | ICD-10-CM | POA: Diagnosis not present

## 2018-07-28 DIAGNOSIS — Z9181 History of falling: Secondary | ICD-10-CM | POA: Diagnosis not present

## 2018-07-28 DIAGNOSIS — Z7982 Long term (current) use of aspirin: Secondary | ICD-10-CM | POA: Diagnosis not present

## 2018-07-28 DIAGNOSIS — Z96641 Presence of right artificial hip joint: Secondary | ICD-10-CM | POA: Diagnosis not present

## 2018-07-28 DIAGNOSIS — Z471 Aftercare following joint replacement surgery: Secondary | ICD-10-CM | POA: Diagnosis not present

## 2018-08-01 DIAGNOSIS — Z87891 Personal history of nicotine dependence: Secondary | ICD-10-CM | POA: Diagnosis not present

## 2018-08-01 DIAGNOSIS — Z96641 Presence of right artificial hip joint: Secondary | ICD-10-CM | POA: Diagnosis not present

## 2018-08-01 DIAGNOSIS — Z9181 History of falling: Secondary | ICD-10-CM | POA: Diagnosis not present

## 2018-08-01 DIAGNOSIS — Z7982 Long term (current) use of aspirin: Secondary | ICD-10-CM | POA: Diagnosis not present

## 2018-08-01 DIAGNOSIS — Z471 Aftercare following joint replacement surgery: Secondary | ICD-10-CM | POA: Diagnosis not present

## 2018-08-04 NOTE — Anesthesia Postprocedure Evaluation (Signed)
Anesthesia Post Note  Patient: Jackson Powers  Procedure(s) Performed: RIGHT TOTAL HIP ARTHROPLASTY ANTERIOR APPROACH (Right Hip)     Patient location during evaluation: PACU Anesthesia Type: Spinal Level of consciousness: oriented, awake and alert and awake Pain management: pain level controlled Vital Signs Assessment: post-procedure vital signs reviewed and stable Respiratory status: spontaneous breathing, respiratory function stable, patient connected to nasal cannula oxygen and nonlabored ventilation Cardiovascular status: blood pressure returned to baseline and stable Postop Assessment: no headache, no backache and no apparent nausea or vomiting Anesthetic complications: no    Last Vitals:  Vitals:   07/21/18 0611 07/21/18 0939  BP: (!) 150/73 (!) 171/91  Pulse: 84 71  Resp: 17 16  Temp: 36.4 C 36.5 C  SpO2: 98% 93%    Last Pain:  Vitals:   07/21/18 0939  TempSrc: Oral  PainSc:                  Catalina Gravel

## 2018-08-17 DIAGNOSIS — Z23 Encounter for immunization: Secondary | ICD-10-CM | POA: Diagnosis not present

## 2018-08-23 DIAGNOSIS — Z96641 Presence of right artificial hip joint: Secondary | ICD-10-CM | POA: Diagnosis not present

## 2018-11-07 DIAGNOSIS — E669 Obesity, unspecified: Secondary | ICD-10-CM | POA: Diagnosis not present

## 2018-11-07 DIAGNOSIS — Z0000 Encounter for general adult medical examination without abnormal findings: Secondary | ICD-10-CM | POA: Diagnosis not present

## 2018-11-07 DIAGNOSIS — I10 Essential (primary) hypertension: Secondary | ICD-10-CM | POA: Diagnosis not present

## 2018-11-07 DIAGNOSIS — Z6832 Body mass index (BMI) 32.0-32.9, adult: Secondary | ICD-10-CM | POA: Diagnosis not present

## 2019-02-02 DIAGNOSIS — L821 Other seborrheic keratosis: Secondary | ICD-10-CM | POA: Diagnosis not present

## 2019-02-02 DIAGNOSIS — L570 Actinic keratosis: Secondary | ICD-10-CM | POA: Diagnosis not present

## 2019-06-16 DIAGNOSIS — Z20828 Contact with and (suspected) exposure to other viral communicable diseases: Secondary | ICD-10-CM | POA: Diagnosis not present

## 2019-06-16 DIAGNOSIS — C44219 Basal cell carcinoma of skin of left ear and external auricular canal: Secondary | ICD-10-CM | POA: Diagnosis not present

## 2019-06-26 DIAGNOSIS — U071 COVID-19: Secondary | ICD-10-CM | POA: Diagnosis not present

## 2019-06-26 DIAGNOSIS — J111 Influenza due to unidentified influenza virus with other respiratory manifestations: Secondary | ICD-10-CM | POA: Diagnosis not present

## 2019-06-26 DIAGNOSIS — R51 Headache: Secondary | ICD-10-CM | POA: Diagnosis not present

## 2019-06-27 DIAGNOSIS — J019 Acute sinusitis, unspecified: Secondary | ICD-10-CM | POA: Diagnosis not present

## 2019-07-12 DIAGNOSIS — C44229 Squamous cell carcinoma of skin of left ear and external auricular canal: Secondary | ICD-10-CM | POA: Diagnosis not present

## 2019-08-11 DIAGNOSIS — C44622 Squamous cell carcinoma of skin of right upper limb, including shoulder: Secondary | ICD-10-CM | POA: Diagnosis not present

## 2019-08-11 DIAGNOSIS — L720 Epidermal cyst: Secondary | ICD-10-CM | POA: Diagnosis not present

## 2019-08-11 DIAGNOSIS — L570 Actinic keratosis: Secondary | ICD-10-CM | POA: Diagnosis not present

## 2019-08-15 DIAGNOSIS — L720 Epidermal cyst: Secondary | ICD-10-CM | POA: Diagnosis not present

## 2019-08-22 DIAGNOSIS — D0461 Carcinoma in situ of skin of right upper limb, including shoulder: Secondary | ICD-10-CM | POA: Diagnosis not present

## 2019-08-25 DIAGNOSIS — Z23 Encounter for immunization: Secondary | ICD-10-CM | POA: Diagnosis not present

## 2019-10-27 DIAGNOSIS — I10 Essential (primary) hypertension: Secondary | ICD-10-CM | POA: Diagnosis not present

## 2019-10-27 DIAGNOSIS — Z125 Encounter for screening for malignant neoplasm of prostate: Secondary | ICD-10-CM | POA: Diagnosis not present

## 2019-11-01 DIAGNOSIS — I10 Essential (primary) hypertension: Secondary | ICD-10-CM | POA: Diagnosis not present

## 2019-11-01 DIAGNOSIS — Z1331 Encounter for screening for depression: Secondary | ICD-10-CM | POA: Diagnosis not present

## 2019-11-01 DIAGNOSIS — Z9181 History of falling: Secondary | ICD-10-CM | POA: Diagnosis not present

## 2019-11-01 DIAGNOSIS — Z0000 Encounter for general adult medical examination without abnormal findings: Secondary | ICD-10-CM | POA: Diagnosis not present

## 2019-11-28 DIAGNOSIS — L570 Actinic keratosis: Secondary | ICD-10-CM | POA: Diagnosis not present

## 2019-11-28 DIAGNOSIS — C44229 Squamous cell carcinoma of skin of left ear and external auricular canal: Secondary | ICD-10-CM | POA: Diagnosis not present

## 2019-12-14 DIAGNOSIS — R7303 Prediabetes: Secondary | ICD-10-CM | POA: Diagnosis not present

## 2019-12-14 DIAGNOSIS — I10 Essential (primary) hypertension: Secondary | ICD-10-CM | POA: Diagnosis not present

## 2019-12-14 DIAGNOSIS — R42 Dizziness and giddiness: Secondary | ICD-10-CM | POA: Diagnosis not present

## 2019-12-14 DIAGNOSIS — Z6831 Body mass index (BMI) 31.0-31.9, adult: Secondary | ICD-10-CM | POA: Diagnosis not present

## 2019-12-15 DIAGNOSIS — Z23 Encounter for immunization: Secondary | ICD-10-CM | POA: Diagnosis not present

## 2019-12-21 DIAGNOSIS — H5203 Hypermetropia, bilateral: Secondary | ICD-10-CM | POA: Diagnosis not present

## 2020-01-12 DIAGNOSIS — Z23 Encounter for immunization: Secondary | ICD-10-CM | POA: Diagnosis not present

## 2020-05-15 DIAGNOSIS — Z6831 Body mass index (BMI) 31.0-31.9, adult: Secondary | ICD-10-CM | POA: Diagnosis not present

## 2020-05-15 DIAGNOSIS — J309 Allergic rhinitis, unspecified: Secondary | ICD-10-CM | POA: Diagnosis not present

## 2020-05-15 DIAGNOSIS — H8110 Benign paroxysmal vertigo, unspecified ear: Secondary | ICD-10-CM | POA: Diagnosis not present

## 2020-05-28 DIAGNOSIS — L720 Epidermal cyst: Secondary | ICD-10-CM | POA: Diagnosis not present

## 2020-05-28 DIAGNOSIS — L578 Other skin changes due to chronic exposure to nonionizing radiation: Secondary | ICD-10-CM | POA: Diagnosis not present

## 2020-05-28 DIAGNOSIS — L821 Other seborrheic keratosis: Secondary | ICD-10-CM | POA: Diagnosis not present

## 2020-05-28 DIAGNOSIS — L570 Actinic keratosis: Secondary | ICD-10-CM | POA: Diagnosis not present

## 2020-06-03 DIAGNOSIS — J019 Acute sinusitis, unspecified: Secondary | ICD-10-CM | POA: Diagnosis not present

## 2020-06-03 DIAGNOSIS — R1310 Dysphagia, unspecified: Secondary | ICD-10-CM | POA: Diagnosis not present

## 2020-06-03 DIAGNOSIS — I10 Essential (primary) hypertension: Secondary | ICD-10-CM | POA: Diagnosis not present

## 2020-06-03 DIAGNOSIS — R55 Syncope and collapse: Secondary | ICD-10-CM | POA: Diagnosis not present

## 2020-06-03 DIAGNOSIS — Z6831 Body mass index (BMI) 31.0-31.9, adult: Secondary | ICD-10-CM | POA: Diagnosis not present

## 2020-07-04 DIAGNOSIS — K219 Gastro-esophageal reflux disease without esophagitis: Secondary | ICD-10-CM | POA: Diagnosis not present

## 2020-07-04 DIAGNOSIS — R1310 Dysphagia, unspecified: Secondary | ICD-10-CM | POA: Diagnosis not present

## 2020-07-04 DIAGNOSIS — Z139 Encounter for screening, unspecified: Secondary | ICD-10-CM | POA: Diagnosis not present

## 2020-07-04 DIAGNOSIS — I10 Essential (primary) hypertension: Secondary | ICD-10-CM | POA: Diagnosis not present

## 2020-07-04 DIAGNOSIS — Z6831 Body mass index (BMI) 31.0-31.9, adult: Secondary | ICD-10-CM | POA: Diagnosis not present

## 2020-07-04 DIAGNOSIS — H8110 Benign paroxysmal vertigo, unspecified ear: Secondary | ICD-10-CM | POA: Diagnosis not present

## 2020-08-19 DIAGNOSIS — R42 Dizziness and giddiness: Secondary | ICD-10-CM | POA: Diagnosis not present

## 2020-08-19 DIAGNOSIS — I10 Essential (primary) hypertension: Secondary | ICD-10-CM | POA: Diagnosis not present

## 2020-08-19 DIAGNOSIS — R471 Dysarthria and anarthria: Secondary | ICD-10-CM | POA: Diagnosis not present

## 2020-08-19 DIAGNOSIS — R55 Syncope and collapse: Secondary | ICD-10-CM | POA: Diagnosis not present

## 2020-08-19 DIAGNOSIS — Z23 Encounter for immunization: Secondary | ICD-10-CM | POA: Diagnosis not present

## 2020-08-20 ENCOUNTER — Other Ambulatory Visit: Payer: Self-pay | Admitting: Nurse Practitioner

## 2020-08-20 DIAGNOSIS — R42 Dizziness and giddiness: Secondary | ICD-10-CM

## 2020-08-20 DIAGNOSIS — S61412A Laceration without foreign body of left hand, initial encounter: Secondary | ICD-10-CM | POA: Diagnosis not present

## 2020-08-22 ENCOUNTER — Ambulatory Visit
Admission: RE | Admit: 2020-08-22 | Discharge: 2020-08-22 | Disposition: A | Discharge status: Home / Self Care | Payer: Medicare Other | Source: Ambulatory Visit | Attending: Nurse Practitioner | Admitting: Nurse Practitioner

## 2020-08-22 DIAGNOSIS — R42 Dizziness and giddiness: Secondary | ICD-10-CM

## 2020-08-22 DIAGNOSIS — R55 Syncope and collapse: Secondary | ICD-10-CM | POA: Diagnosis not present

## 2020-08-28 DIAGNOSIS — R42 Dizziness and giddiness: Secondary | ICD-10-CM | POA: Diagnosis not present

## 2020-08-28 DIAGNOSIS — I6529 Occlusion and stenosis of unspecified carotid artery: Secondary | ICD-10-CM | POA: Diagnosis not present

## 2020-08-28 DIAGNOSIS — R110 Nausea: Secondary | ICD-10-CM | POA: Diagnosis not present

## 2020-08-28 DIAGNOSIS — R55 Syncope and collapse: Secondary | ICD-10-CM | POA: Diagnosis not present

## 2020-08-28 DIAGNOSIS — Z131 Encounter for screening for diabetes mellitus: Secondary | ICD-10-CM | POA: Diagnosis not present

## 2020-08-28 DIAGNOSIS — M6281 Muscle weakness (generalized): Secondary | ICD-10-CM | POA: Diagnosis not present

## 2020-08-28 DIAGNOSIS — D519 Vitamin B12 deficiency anemia, unspecified: Secondary | ICD-10-CM | POA: Diagnosis not present

## 2020-08-28 DIAGNOSIS — E559 Vitamin D deficiency, unspecified: Secondary | ICD-10-CM | POA: Diagnosis not present

## 2020-08-30 DIAGNOSIS — M6281 Muscle weakness (generalized): Secondary | ICD-10-CM | POA: Diagnosis not present

## 2020-08-30 DIAGNOSIS — R110 Nausea: Secondary | ICD-10-CM | POA: Diagnosis not present

## 2020-08-30 DIAGNOSIS — D519 Vitamin B12 deficiency anemia, unspecified: Secondary | ICD-10-CM | POA: Diagnosis not present

## 2020-08-30 DIAGNOSIS — E559 Vitamin D deficiency, unspecified: Secondary | ICD-10-CM | POA: Diagnosis not present

## 2020-08-30 DIAGNOSIS — I6529 Occlusion and stenosis of unspecified carotid artery: Secondary | ICD-10-CM | POA: Diagnosis not present

## 2020-08-30 DIAGNOSIS — R42 Dizziness and giddiness: Secondary | ICD-10-CM | POA: Diagnosis not present

## 2020-08-30 DIAGNOSIS — Z131 Encounter for screening for diabetes mellitus: Secondary | ICD-10-CM | POA: Diagnosis not present

## 2020-08-30 DIAGNOSIS — R55 Syncope and collapse: Secondary | ICD-10-CM | POA: Diagnosis not present

## 2020-09-04 ENCOUNTER — Other Ambulatory Visit: Payer: Medicare Other

## 2020-09-09 DIAGNOSIS — I340 Nonrheumatic mitral (valve) insufficiency: Secondary | ICD-10-CM | POA: Diagnosis not present

## 2020-09-09 DIAGNOSIS — R55 Syncope and collapse: Secondary | ICD-10-CM | POA: Diagnosis not present

## 2020-09-09 DIAGNOSIS — I6522 Occlusion and stenosis of left carotid artery: Secondary | ICD-10-CM | POA: Diagnosis not present

## 2020-09-09 DIAGNOSIS — R9431 Abnormal electrocardiogram [ECG] [EKG]: Secondary | ICD-10-CM | POA: Diagnosis not present

## 2020-09-09 DIAGNOSIS — I10 Essential (primary) hypertension: Secondary | ICD-10-CM | POA: Diagnosis not present

## 2020-09-09 DIAGNOSIS — I455 Other specified heart block: Secondary | ICD-10-CM | POA: Diagnosis not present

## 2020-09-09 DIAGNOSIS — I6529 Occlusion and stenosis of unspecified carotid artery: Secondary | ICD-10-CM | POA: Diagnosis not present

## 2020-09-09 DIAGNOSIS — I351 Nonrheumatic aortic (valve) insufficiency: Secondary | ICD-10-CM | POA: Diagnosis not present

## 2020-09-13 DIAGNOSIS — R9431 Abnormal electrocardiogram [ECG] [EKG]: Secondary | ICD-10-CM | POA: Diagnosis not present

## 2020-09-17 DIAGNOSIS — I10 Essential (primary) hypertension: Secondary | ICD-10-CM | POA: Diagnosis not present

## 2020-09-17 DIAGNOSIS — R55 Syncope and collapse: Secondary | ICD-10-CM | POA: Diagnosis not present

## 2020-09-17 DIAGNOSIS — R42 Dizziness and giddiness: Secondary | ICD-10-CM | POA: Diagnosis not present

## 2020-09-17 DIAGNOSIS — I209 Angina pectoris, unspecified: Secondary | ICD-10-CM | POA: Diagnosis not present

## 2020-09-17 DIAGNOSIS — E785 Hyperlipidemia, unspecified: Secondary | ICD-10-CM | POA: Diagnosis not present

## 2020-09-17 DIAGNOSIS — R9431 Abnormal electrocardiogram [ECG] [EKG]: Secondary | ICD-10-CM | POA: Diagnosis not present

## 2020-09-23 DIAGNOSIS — I209 Angina pectoris, unspecified: Secondary | ICD-10-CM | POA: Diagnosis not present

## 2020-09-26 ENCOUNTER — Ambulatory Visit: Payer: Self-pay | Admitting: Cardiovascular Disease

## 2020-09-26 DIAGNOSIS — R109 Unspecified abdominal pain: Secondary | ICD-10-CM | POA: Diagnosis not present

## 2020-09-26 DIAGNOSIS — I209 Angina pectoris, unspecified: Secondary | ICD-10-CM | POA: Diagnosis not present

## 2020-09-26 DIAGNOSIS — E785 Hyperlipidemia, unspecified: Secondary | ICD-10-CM | POA: Diagnosis not present

## 2020-09-26 DIAGNOSIS — I10 Essential (primary) hypertension: Secondary | ICD-10-CM | POA: Diagnosis not present

## 2020-09-26 DIAGNOSIS — I200 Unstable angina: Secondary | ICD-10-CM | POA: Insufficient documentation

## 2020-09-26 MED ORDER — SODIUM CHLORIDE 0.9% FLUSH
3.0000 mL | Freq: Two times a day (BID) | INTRAVENOUS | Status: AC
  Filled 2020-09-26: qty 3

## 2020-10-01 ENCOUNTER — Other Ambulatory Visit: Payer: Self-pay

## 2020-10-01 ENCOUNTER — Encounter
Admission: RE | Disposition: A | Discharge status: Home / Self Care | Payer: Self-pay | Source: Home / Self Care | Attending: Cardiovascular Disease

## 2020-10-01 ENCOUNTER — Encounter: Payer: Self-pay | Admitting: Cardiovascular Disease

## 2020-10-01 ENCOUNTER — Ambulatory Visit
Admission: RE | Admit: 2020-10-01 | Discharge: 2020-10-01 | Disposition: A | Discharge status: Home / Self Care | Payer: BC Managed Care – PPO | Attending: Cardiovascular Disease | Admitting: Cardiovascular Disease

## 2020-10-01 DIAGNOSIS — R42 Dizziness and giddiness: Secondary | ICD-10-CM | POA: Diagnosis not present

## 2020-10-01 DIAGNOSIS — R55 Syncope and collapse: Secondary | ICD-10-CM | POA: Diagnosis not present

## 2020-10-01 DIAGNOSIS — I25110 Atherosclerotic heart disease of native coronary artery with unstable angina pectoris: Secondary | ICD-10-CM | POA: Insufficient documentation

## 2020-10-01 DIAGNOSIS — Z7982 Long term (current) use of aspirin: Secondary | ICD-10-CM | POA: Diagnosis not present

## 2020-10-01 DIAGNOSIS — Z79899 Other long term (current) drug therapy: Secondary | ICD-10-CM | POA: Insufficient documentation

## 2020-10-01 DIAGNOSIS — I200 Unstable angina: Secondary | ICD-10-CM

## 2020-10-01 DIAGNOSIS — E785 Hyperlipidemia, unspecified: Secondary | ICD-10-CM | POA: Diagnosis not present

## 2020-10-01 HISTORY — PX: LEFT HEART CATH AND CORONARY ANGIOGRAPHY: CATH118249

## 2020-10-01 SURGERY — LEFT HEART CATH AND CORONARY ANGIOGRAPHY
Anesthesia: Moderate Sedation | Laterality: Left

## 2020-10-01 MED ORDER — HEPARIN (PORCINE) IN NACL 1000-0.9 UT/500ML-% IV SOLN
INTRAVENOUS | Status: AC
  Filled 2020-10-01: qty 1000

## 2020-10-01 MED ORDER — MIDAZOLAM HCL 2 MG/2ML IJ SOLN
INTRAMUSCULAR | Status: AC
  Filled 2020-10-01: qty 2

## 2020-10-01 MED ORDER — LIDOCAINE HCL (PF) 1 % IJ SOLN
INTRAMUSCULAR | Status: AC
  Filled 2020-10-01: qty 30

## 2020-10-01 MED ORDER — ONDANSETRON HCL 4 MG/2ML IJ SOLN: 4.0000 mg | Freq: Four times a day (QID) | INTRAMUSCULAR | Status: DC | PRN

## 2020-10-01 MED ORDER — LIDOCAINE HCL (PF) 1 % IJ SOLN
INTRAMUSCULAR | Status: DC | PRN
  Administered 2020-10-01: 30 mL

## 2020-10-01 MED ORDER — SODIUM CHLORIDE 0.9 % IV SOLN: 250.0000 mL | INTRAVENOUS | Status: DC | PRN

## 2020-10-01 MED ORDER — ASPIRIN 81 MG PO CHEW
CHEWABLE_TABLET | ORAL | Status: AC
  Filled 2020-10-01: qty 1

## 2020-10-01 MED ORDER — SODIUM CHLORIDE 0.9% FLUSH: 3.0000 mL | Freq: Two times a day (BID) | INTRAVENOUS | Status: DC

## 2020-10-01 MED ORDER — ACETAMINOPHEN 325 MG PO TABS: 650.0000 mg | ORAL_TABLET | ORAL | Status: DC | PRN

## 2020-10-01 MED ORDER — MIDAZOLAM HCL 2 MG/2ML IJ SOLN
INTRAMUSCULAR | Status: DC | PRN
  Administered 2020-10-01 (×2): 1 mg via INTRAVENOUS

## 2020-10-01 MED ORDER — SODIUM CHLORIDE 0.9% FLUSH: 3.0000 mL | INTRAVENOUS | Status: DC | PRN

## 2020-10-01 MED ORDER — IOHEXOL 300 MG/ML  SOLN
INTRAMUSCULAR | Status: DC | PRN
  Administered 2020-10-01: 140 mL

## 2020-10-01 MED ORDER — FENTANYL CITRATE (PF) 100 MCG/2ML IJ SOLN
INTRAMUSCULAR | Status: DC | PRN
  Administered 2020-10-01: 50 ug via INTRAVENOUS
  Administered 2020-10-01: 25 ug via INTRAVENOUS

## 2020-10-01 MED ORDER — SODIUM CHLORIDE 0.9 % WEIGHT BASED INFUSION
3.0000 mL/kg/h | INTRAVENOUS | Status: DC
  Administered 2020-10-01: 3 mL/kg/h via INTRAVENOUS

## 2020-10-01 MED ORDER — ASPIRIN 81 MG PO CHEW
81.0000 mg | CHEWABLE_TABLET | ORAL | Status: AC
  Administered 2020-10-01: 81 mg via ORAL

## 2020-10-01 MED ORDER — FENTANYL CITRATE (PF) 100 MCG/2ML IJ SOLN
INTRAMUSCULAR | Status: AC
  Filled 2020-10-01: qty 2

## 2020-10-01 MED ORDER — SODIUM CHLORIDE 0.9 % WEIGHT BASED INFUSION: 1.0000 mL/kg/h | INTRAVENOUS | Status: DC

## 2020-10-01 MED ORDER — HEPARIN (PORCINE) IN NACL 1000-0.9 UT/500ML-% IV SOLN
INTRAVENOUS | Status: DC | PRN
  Administered 2020-10-01: 500 mL

## 2020-10-01 MED ORDER — HYDRALAZINE HCL 20 MG/ML IJ SOLN: 10.0000 mg | INTRAMUSCULAR | Status: DC | PRN

## 2020-10-01 MED ORDER — LABETALOL HCL 5 MG/ML IV SOLN: 10.0000 mg | INTRAVENOUS | Status: DC | PRN

## 2020-10-01 SURGICAL SUPPLY — 10 items
CATH INFINITI 5FR ANG PIGTAIL (CATHETERS) ×3 IMPLANT
CATH INFINITI 5FR JL4 (CATHETERS) ×3 IMPLANT
CATH INFINITI JR4 5F (CATHETERS) ×3 IMPLANT
DEVICE CLOSURE MYNXGRIP 5F (Vascular Products) ×3 IMPLANT
KIT LHK - MID 3VCOMP MANIF (MISCELLANEOUS) ×3 IMPLANT
NEEDLE PERC 18GX7CM (NEEDLE) ×3 IMPLANT
PACK CARDIAC CATH (CUSTOM PROCEDURE TRAY) ×3 IMPLANT
SHEATH AVANTI 5FR X 11CM (SHEATH) ×3 IMPLANT
TUBING CONTRAST HIGH PRESS 72 (TUBING) ×3 IMPLANT
WIRE GUIDERIGHT .035X150 (WIRE) ×3 IMPLANT

## 2020-10-04 DIAGNOSIS — I200 Unstable angina: Secondary | ICD-10-CM | POA: Insufficient documentation

## 2020-10-08 DIAGNOSIS — E785 Hyperlipidemia, unspecified: Secondary | ICD-10-CM | POA: Diagnosis not present

## 2020-10-08 DIAGNOSIS — I10 Essential (primary) hypertension: Secondary | ICD-10-CM | POA: Diagnosis not present

## 2020-10-08 DIAGNOSIS — I351 Nonrheumatic aortic (valve) insufficiency: Secondary | ICD-10-CM | POA: Diagnosis not present

## 2020-10-08 DIAGNOSIS — I340 Nonrheumatic mitral (valve) insufficiency: Secondary | ICD-10-CM | POA: Diagnosis not present

## 2020-10-08 DIAGNOSIS — I2510 Atherosclerotic heart disease of native coronary artery without angina pectoris: Secondary | ICD-10-CM | POA: Diagnosis not present

## 2020-10-11 DIAGNOSIS — Z23 Encounter for immunization: Secondary | ICD-10-CM | POA: Diagnosis not present

## 2020-10-21 DIAGNOSIS — I2510 Atherosclerotic heart disease of native coronary artery without angina pectoris: Secondary | ICD-10-CM | POA: Diagnosis not present

## 2020-10-21 DIAGNOSIS — Z125 Encounter for screening for malignant neoplasm of prostate: Secondary | ICD-10-CM | POA: Diagnosis not present

## 2020-10-21 DIAGNOSIS — R7303 Prediabetes: Secondary | ICD-10-CM | POA: Diagnosis not present

## 2020-10-21 DIAGNOSIS — Z0000 Encounter for general adult medical examination without abnormal findings: Secondary | ICD-10-CM | POA: Diagnosis not present

## 2020-10-21 DIAGNOSIS — I10 Essential (primary) hypertension: Secondary | ICD-10-CM | POA: Diagnosis not present

## 2020-11-07 DIAGNOSIS — E785 Hyperlipidemia, unspecified: Secondary | ICD-10-CM | POA: Diagnosis not present

## 2020-11-07 DIAGNOSIS — I340 Nonrheumatic mitral (valve) insufficiency: Secondary | ICD-10-CM | POA: Diagnosis not present

## 2020-11-07 DIAGNOSIS — I10 Essential (primary) hypertension: Secondary | ICD-10-CM | POA: Diagnosis not present

## 2020-11-07 DIAGNOSIS — I2510 Atherosclerotic heart disease of native coronary artery without angina pectoris: Secondary | ICD-10-CM | POA: Diagnosis not present

## 2020-12-17 ENCOUNTER — Inpatient Hospital Stay: Payer: BC Managed Care – PPO

## 2020-12-17 ENCOUNTER — Emergency Department: Payer: BC Managed Care – PPO

## 2020-12-17 ENCOUNTER — Observation Stay
Admission: EM | Admit: 2020-12-17 | Discharge: 2020-12-18 | Disposition: A | Discharge status: Home / Self Care | Payer: BC Managed Care – PPO | Attending: Internal Medicine | Admitting: Internal Medicine

## 2020-12-17 ENCOUNTER — Other Ambulatory Visit: Payer: Self-pay

## 2020-12-17 DIAGNOSIS — Z96641 Presence of right artificial hip joint: Secondary | ICD-10-CM | POA: Insufficient documentation

## 2020-12-17 DIAGNOSIS — R29818 Other symptoms and signs involving the nervous system: Secondary | ICD-10-CM | POA: Diagnosis not present

## 2020-12-17 DIAGNOSIS — I2119 ST elevation (STEMI) myocardial infarction involving other coronary artery of inferior wall: Secondary | ICD-10-CM | POA: Diagnosis not present

## 2020-12-17 DIAGNOSIS — I639 Cerebral infarction, unspecified: Secondary | ICD-10-CM | POA: Diagnosis not present

## 2020-12-17 DIAGNOSIS — I6502 Occlusion and stenosis of left vertebral artery: Secondary | ICD-10-CM | POA: Diagnosis not present

## 2020-12-17 DIAGNOSIS — Z20822 Contact with and (suspected) exposure to covid-19: Secondary | ICD-10-CM | POA: Diagnosis not present

## 2020-12-17 DIAGNOSIS — Z86010 Personal history of colonic polyps: Secondary | ICD-10-CM | POA: Diagnosis not present

## 2020-12-17 DIAGNOSIS — Z87891 Personal history of nicotine dependence: Secondary | ICD-10-CM | POA: Diagnosis not present

## 2020-12-17 DIAGNOSIS — G459 Transient cerebral ischemic attack, unspecified: Principal | ICD-10-CM | POA: Diagnosis present

## 2020-12-17 DIAGNOSIS — Z79899 Other long term (current) drug therapy: Secondary | ICD-10-CM | POA: Diagnosis not present

## 2020-12-17 DIAGNOSIS — Z7982 Long term (current) use of aspirin: Secondary | ICD-10-CM | POA: Diagnosis not present

## 2020-12-17 DIAGNOSIS — R202 Paresthesia of skin: Secondary | ICD-10-CM | POA: Diagnosis not present

## 2020-12-17 DIAGNOSIS — G9389 Other specified disorders of brain: Secondary | ICD-10-CM | POA: Diagnosis not present

## 2020-12-17 DIAGNOSIS — I10 Essential (primary) hypertension: Secondary | ICD-10-CM

## 2020-12-17 DIAGNOSIS — R200 Anesthesia of skin: Secondary | ICD-10-CM

## 2020-12-17 LAB — COMPREHENSIVE METABOLIC PANEL
ALT: 31 U/L (ref 0–44)
AST: 34 U/L (ref 15–41)
Albumin: 4.4 g/dL (ref 3.5–5.0)
Alkaline Phosphatase: 36 U/L — ABNORMAL LOW (ref 38–126)
Anion gap: 10 (ref 5–15)
BUN: 21 mg/dL (ref 8–23)
CO2: 26 mmol/L (ref 22–32)
Calcium: 9.7 mg/dL (ref 8.9–10.3)
Chloride: 102 mmol/L (ref 98–111)
Creatinine, Ser: 0.96 mg/dL (ref 0.61–1.24)
GFR, Estimated: >60 mL/min (ref >60)
Glucose, Bld: 119 mg/dL — ABNORMAL HIGH (ref 70–99)
Potassium: 4.3 mmol/L (ref 3.5–5.1)
Sodium: 138 mmol/L (ref 135–145)
Total Bilirubin: 0.8 mg/dL (ref 0.3–1.2)
Total Protein: 7.6 g/dL (ref 6.5–8.1)

## 2020-12-17 LAB — DIFFERENTIAL
Abs Immature Granulocytes: 0.02 10*3/uL (ref 0.00–0.07)
Basophils Absolute: 0 10*3/uL (ref 0.0–0.1)
Basophils Relative: 0 %
Eosinophils Absolute: 0.1 10*3/uL (ref 0.0–0.5)
Eosinophils Relative: 2 %
Immature Granulocytes: 0 %
Lymphocytes Relative: 30 %
Lymphs Abs: 2.6 10*3/uL (ref 0.7–4.0)
Monocytes Absolute: 0.5 10*3/uL (ref 0.1–1.0)
Monocytes Relative: 6 %
Neutro Abs: 5.4 10*3/uL (ref 1.7–7.7)
Neutrophils Relative %: 62 %

## 2020-12-17 LAB — SARS CORONAVIRUS 2 BY RT PCR (HOSPITAL ORDER, PERFORMED IN ~~LOC~~ HOSPITAL LAB): SARS Coronavirus 2: NEGATIVE

## 2020-12-17 LAB — CBC
HCT: 42.4 % (ref 39.0–52.0)
Hemoglobin: 14.4 g/dL (ref 13.0–17.0)
MCH: 30.3 pg (ref 26.0–34.0)
MCHC: 34 g/dL (ref 30.0–36.0)
MCV: 89.1 fL (ref 80.0–100.0)
Platelets: 216 10*3/uL (ref 150–400)
RBC: 4.76 MIL/uL (ref 4.22–5.81)
RDW: 13 % (ref 11.5–15.5)
WBC: 8.7 10*3/uL (ref 4.0–10.5)
nRBC: 0 % (ref 0.0–0.2)

## 2020-12-17 LAB — CBG MONITORING, ED: Glucose-Capillary: 130 mg/dL — ABNORMAL HIGH (ref 70–99)

## 2020-12-17 LAB — PROTIME-INR
INR: 1.1 (ref 0.8–1.2)
Prothrombin Time: 13.7 seconds (ref 11.4–15.2)

## 2020-12-17 LAB — APTT: aPTT: 35 seconds (ref 24–36)

## 2020-12-17 MED ORDER — SODIUM CHLORIDE 0.9% FLUSH
3.0000 mL | Freq: Once | INTRAVENOUS | Status: AC
  Administered 2020-12-17: 3 mL via INTRAVENOUS

## 2020-12-17 MED ORDER — ATORVASTATIN CALCIUM 20 MG PO TABS
40.0000 mg | ORAL_TABLET | Freq: Every day | ORAL | Status: DC
  Administered 2020-12-17 – 2020-12-18 (×2): 40 mg via ORAL
  Filled 2020-12-17 (×2): qty 2

## 2020-12-17 MED ORDER — ACETAMINOPHEN 650 MG RE SUPP: 650.0000 mg | RECTAL | Status: DC | PRN

## 2020-12-17 MED ORDER — STROKE: EARLY STAGES OF RECOVERY BOOK: Freq: Once | Status: DC

## 2020-12-17 MED ORDER — CLOPIDOGREL BISULFATE 75 MG PO TABS
300.0000 mg | ORAL_TABLET | Freq: Once | ORAL | Status: AC
  Administered 2020-12-17: 300 mg via ORAL
  Filled 2020-12-17: qty 4

## 2020-12-17 MED ORDER — ASPIRIN 81 MG PO CHEW
81.0000 mg | CHEWABLE_TABLET | Freq: Every day | ORAL | Status: DC
Start: 2020-12-17 — End: 2020-12-19
  Administered 2020-12-17 – 2020-12-18 (×2): 81 mg via ORAL
  Filled 2020-12-17 (×2): qty 1

## 2020-12-17 MED ORDER — ACETAMINOPHEN 160 MG/5ML PO SOLN
650.0000 mg | ORAL | Status: DC | PRN
  Filled 2020-12-17: qty 20.3

## 2020-12-17 MED ORDER — ACETAMINOPHEN 325 MG PO TABS: 650.0000 mg | ORAL_TABLET | ORAL | Status: DC | PRN

## 2020-12-17 MED ORDER — CLOPIDOGREL BISULFATE 75 MG PO TABS
75.0000 mg | ORAL_TABLET | Freq: Every day | ORAL | Status: DC
  Administered 2020-12-18: 75 mg via ORAL
  Filled 2020-12-17: qty 1

## 2020-12-17 MED ORDER — ENOXAPARIN SODIUM 60 MG/0.6ML ~~LOC~~ SOLN
0.5000 mg/kg | SUBCUTANEOUS | Status: DC
  Administered 2020-12-17: 57.5 mg via SUBCUTANEOUS
  Filled 2020-12-17 (×2): qty 0.6

## 2020-12-17 NOTE — ED Provider Notes (Signed)
[facility] Emergency Department Provider Note  Time seen: 5:58 PM  I have reviewed the triage vital signs and the nursing notes.   HISTORY  Chief Complaint Numbness   HPI Jackson Powers is a 75 y.o. male with a past medical history of arthritis, hypertension, angina, presents to the emergency department for acute onset of right-sided numbness/weakness and nausea.  According to the patient around 3 PM today he had acute onset of dizziness and nausea followed by right-sided weakness and numbness.  He states over the past 2 hours most of his symptoms has resolved but he continues to feel somewhat dizzy with mild numbness of the right upper extremity.  No history of CVA previously.  Patient takes an aspirin daily but no other anticoagulation.  Does state mild headache earlier today.  No headache currently.  Patient arrives to room 25 as a code stroke.   Past Medical History:  Diagnosis Date  . Acute gastritis without hemorrhage 2007  . Arthritis    osteoarthritis   . Benign neoplasm of colon 2007  . Esophagitis 2007  . Hypertension   . Orthopnea    reports " i have trouble breathing when i lie completely flat. i have to use 2 people to prop my head up"   . Panic anxiety syndrome    patient reports having severe panic anxiety and claustraphobia. states with ear surgery years ago , they had to tie him down which caused him to panic even more;   . Right BBB/left ant fasc block    (OLD)    Patient Active Problem List   Diagnosis Date Noted  . Unstable angina ([place]) 09/26/2020  . OA (osteoarthritis) of hip 07/20/2018    Past Surgical History:  Procedure Laterality Date  . LEFT HEART CATH AND CORONARY ANGIOGRAPHY Left 10/01/2020   Procedure: LEFT HEART CATH AND CORONARY ANGIOGRAPHY;  Surgeon: Dionisio David, MD;  Location: [facility];  Service: Cardiovascular;  Laterality: Left;  . SKIN CANCER EXCISION Right 2017   EAR; FAILED GRAFT FOLLWOING SURGERY    . TOTAL HIP ARTHROPLASTY Right 07/20/2018   Procedure: RIGHT TOTAL HIP ARTHROPLASTY ANTERIOR APPROACH;  Surgeon: Gaynelle Arabian, MD;  Location: [facility];  Service: Orthopedics;  Laterality: Right;    Prior to Admission medications   Medication Sig Start Date End Date Taking? Authorizing Provider  aspirin EC 81 MG tablet Take 81 mg by mouth daily. Swallow whole.    [provider]  lisinopril (ZESTRIL) 10 MG tablet Take 20 mg by mouth daily.    [provider]    No Known Allergies  No family history on file.  Social History Social History   Tobacco Use  . Smoking status: Former Smoker    Packs/day: 2.00    Years: 15.00    Pack years: 30.00    Types: Cigarettes    Quit date: 1989    Years since quitting: 33.1  . Smokeless tobacco: Never Used  Vaping Use  . Vaping Use: Never used  Substance Use Topics  . Alcohol use: Not Currently  . Drug use: Never    Review of Systems Constitutional: Negative for fever. Cardiovascular: Negative for chest pain. Respiratory: Negative for shortness of breath. Gastrointestinal: Negative for abdominal pain.  Positive for nausea now resolved.  No vomiting or diarrhea. Genitourinary: Negative for urinary compaints Musculoskeletal: Negative for musculoskeletal complaints Neurological: Headache earlier now resolved.  Positive for right-sided numbness and weakness as well as difficulty walking, now resolved  per patient. All other ROS negative  ____________________________________________   PHYSICAL EXAM:  VITAL SIGNS: ED Triage Vitals  Enc Vitals Group     BP 12/17/20 1730 (!) 194/80     Pulse Rate 12/17/20 1730 69     Resp 12/17/20 1730 18     Temp 12/17/20 1730 98.2 F (36.8 C)     Temp Source 12/17/20 1730 Oral     SpO2 12/17/20 1730 94 %     Weight 12/17/20 1728 245 lb (111.1 kg)     Height 12/17/20 1728 6\' 3"  (1.905 m)     Head Circumference --      Peak Flow --      Pain Score 12/17/20 1728 0     Pain Loc --       Pain Edu? --      Excl. in [place]? --    Constitutional: Alert and oriented. Well appearing and in no distress. Eyes: Normal exam ENT      Head: Normocephalic and atraumatic.      Mouth/Throat: Mucous membranes are moist. Cardiovascular: Normal rate, regular rhythm. Respiratory: Normal respiratory effort without tachypnea nor retractions. Breath sounds are clear  Gastrointestinal: Soft and nontender. No distention.  Musculoskeletal: Nontender with normal range of motion in all extremities. Neurologic:  Normal speech and language. No gross focal neurologic deficits on my examination.  See NIH stroke scale for further details. Skin:  Skin is warm, dry and intact.  Psychiatric: Mood and affect are normal.   ____________________________________________    EKG  EKG viewed and interpreted by myself shows a normal sinus rhythm at 69 bpm with a widened QRS, left axis deviation, largely normal intervals and nonspecific ST changes.  ____________________________________________    RADIOLOGY  CT scan negative.  Aspects 10  ____________________________________________   INITIAL IMPRESSION / ASSESSMENT AND PLAN / ED COURSE  Pertinent labs & imaging results that were available during my care of the patient were reviewed by me and considered in my medical decision making (see chart for details).   Patient presents emergency department for acute onset of right-sided numbness and weakness at 3 PM along with nausea.  Currently the patient appears well, NIH stroke scale of 0.  Neurology is seeing via neuro telemetry.  On my examination patient has no significant findings although states around 3 PM he was having significant difficulty using his right upper extremity and walking.  Given the patient's NIH of 0 he is not currently candidate for TPA per neurology.  Given the patient's significant symptoms upon onset however neurology does recommend admission to the hospital service for further  work-up.  NIH Stroke Scale   Interval: Baseline Time: 6:19 PM Person Administering Scale: Harvest Dark  Administer stroke scale items in the order listed. Record performance in each category after each subscale exam. Do not go back and change scores. Follow directions provided for each exam technique. Scores should reflect what the patient does, not what the clinician thinks the patient can do. The clinician should record answers while administering the exam and work quickly. Except where indicated, the patient should not be coached (i.e., repeated requests to patient to make a special effort).   1a  Level of consciousness: 0=alert; keenly responsive  1b. LOC questions:  0=Performs both tasks correctly  1c. LOC commands: 0=Performs both tasks correctly  2.  Best Gaze: 0=normal  3.  Visual: 0=No visual loss  4. Facial Palsy: 0=Normal symmetric movement  5a.  Motor left arm: 0=No drift,  limb holds 90 (or 45) degrees for full 10 seconds  5b.  Motor right arm: 0=No drift, limb holds 90 (or 45) degrees for full 10 seconds  6a. motor left leg: 0=No drift, limb holds 90 (or 45) degrees for full 10 seconds  6b  Motor right leg:  0=No drift, limb holds 90 (or 45) degrees for full 10 seconds  7. Limb Ataxia: 0=Absent  8.  Sensory: 0=Normal; no sensory loss  9. Best Language:  0=No aphasia, normal  10. Dysarthria: 0=Normal  11. Extinction and Inattention: 0=No abnormality  12. Distal motor function: 0=Normal   Total:   0    Jackson Powers was evaluated in Emergency Department on 12/17/2020 for the symptoms described in the history of present illness. He was evaluated in the context of the global COVID-19 pandemic, which necessitated consideration that the patient might be at risk for infection with the SARS-CoV-2 virus that causes COVID-19. Institutional protocols and algorithms that pertain to the evaluation of patients at risk for COVID-19 are in a state of rapid change based on information  released by regulatory bodies including the CDC and federal and state organizations. These policies and algorithms were followed during the patient's care in the ED.  ____________________________________________   FINAL CLINICAL IMPRESSION(S) / ED DIAGNOSES  TIA   Harvest Dark, MD 12/17/20 1820

## 2020-12-17 NOTE — ED Notes (Signed)
Code [place] called to [employer]

## 2020-12-17 NOTE — ED Triage Notes (Addendum)
Pt to ED POV for sudden nausea and numbness in right side of body that started at 1500.  Clear speech. Able to move all extremities cbg 130

## 2020-12-17 NOTE — Consult Note (Signed)
[facility] Telemedicine Consult   Requesting Provider: Tempie Donning Consult Participants: Bedside nurse, patient Location of the provider: [place], [place] Location of the patient: [facility]  This consult was provided via telemedicine with 2-way video and audio communication. The patient/family was informed that care would be provided in this way and agreed to receive care in this manner.    Chief Complaint: Right sided weakness  HPI: Patient is a 75 year old male who was in his normal state of health this morning, without difficulty walking or any numbness.  Around 3 PM, he noticed some nausea and some tingling on the right side that then progressed to numbness and right-sided weakness.  His symptoms lasted for approximately 1 hour before beginning to resolve.  At the current time, he does not feel completely back to normal but he feels much better than he was at the beginning.    LKW: 3 PM tpa given?: No, mild symptoms IR Thrombectomy? No, low NIH Time of teleneurologist evaluation: 17:40  Exam: Vitals:   12/17/20 1730  BP: (!) 194/80  Pulse: 69  Resp: 18  Temp: 98.2 F (36.8 C)  SpO2: 94%    General: He is in bed, no apparent distress  1A: Level of Consciousness - 0 1B: Ask Month and Age - 0 1C: 'Blink Eyes' & 'Squeeze Hands' - 0 2: Test Horizontal Extraocular Movements - 0 3: Test Visual Fields - 0 4: Test Facial Palsy - 0 5A: Test Left Arm Motor Drift - 0 5B: Test Right Arm Motor Drift - 0 6A: Test Left Leg Motor Drift - 0 6B: Test Right Leg Motor Drift - 0 7: Test Limb Ataxia - 0 8: Test Sensation - 0 9: Test Language/Aphasia- 0 10: Test Dysarthria - 0 11: Test Extinction/Inattention - 0 NIHSS score: 0   Imaging Reviewed: CT head - negative  Labs reviewed in epic and pertinent values follow: CBC - normal CBG - 130   Assessment: 75 year old male with acute right-sided numbness and weakness that lasted for  approximately an hour.  At this time, he is mostly back to normal, and though I do not think we can reset the clock given he still feels "a little off," I suspect that this is going to end up being transient ischemic attack.  He will need to be admitted for secondary risk factor modification and stratification.  Recommendations:  - HgbA1c, fasting lipid panel - MRI, MRA  of the brain without contrast - Frequent neuro checks - Echocardiogram - Carotid dopplers - Prophylactic therapy-Antiplatelet med: Aspirin - 81mg  daily and plavix 75mg  following 300mg  load - Risk factor modification - Telemetry monitoring   This patient is receiving care for possible acute neurological changes. There was 45 minutes of care by this provider at the time of service, including time for direct evaluation via telemedicine, review of medical records, imaging studies and discussion of findings with providers, the patient and/or family.  Roland Rack, MD [facility] [phone]  If 7pm- 7am, please page neurology on call as listed in [place].

## 2020-12-17 NOTE — ED Notes (Signed)
Pt to CT

## 2020-12-17 NOTE — Plan of Care (Signed)
  Problem: Education: Goal: Knowledge of General Education information will improve Description: Including pain rating scale, medication(s)/side effects and non-pharmacologic comfort measures Outcome: Progressing   Problem: Health Behavior/Discharge Planning: Goal: Ability to manage health-related needs will improve Outcome: Progressing   Problem: Clinical Measurements: Goal: Ability to maintain clinical measurements within normal limits will improve Outcome: Progressing Goal: Will remain free from infection Outcome: Progressing Goal: Diagnostic test results will improve Outcome: Progressing Goal: Respiratory complications will improve Outcome: Progressing Goal: Cardiovascular complication will be avoided Outcome: Progressing   Problem: Activity: Goal: Risk for activity intolerance will decrease Outcome: Progressing   Problem: Nutrition: Goal: Adequate nutrition will be maintained Outcome: Progressing   Problem: Coping: Goal: Level of anxiety will decrease Outcome: Progressing   Problem: Elimination: Goal: Will not experience complications related to bowel motility Outcome: Progressing Goal: Will not experience complications related to urinary retention Outcome: Progressing   Problem: Pain Managment: Goal: General experience of comfort will improve Outcome: Progressing   Problem: Safety: Goal: Ability to remain free from injury will improve Outcome: Progressing   Problem: Skin Integrity: Goal: Risk for impaired skin integrity will decrease Outcome: Progressing   Problem: Education: Goal: Knowledge of disease or condition will improve Outcome: Progressing Goal: Knowledge of secondary prevention will improve Outcome: Progressing Goal: Knowledge of patient specific risk factors addressed and post discharge goals established will improve Outcome: Progressing Goal: Individualized Educational Video(s) Outcome: Progressing   Problem: Coping: Goal: Will verbalize  positive feelings about self Outcome: Progressing Goal: Will identify appropriate support needs Outcome: Progressing   Problem: Health Behavior/Discharge Planning: Goal: Ability to manage health-related needs will improve Outcome: Progressing   Problem: Self-Care: Goal: Ability to participate in self-care as condition permits will improve Outcome: Progressing Goal: Verbalization of feelings and concerns over difficulty with self-care will improve Outcome: Progressing Goal: Ability to communicate needs accurately will improve Outcome: Progressing   Problem: Ischemic Stroke/TIA Tissue Perfusion: Goal: Complications of ischemic stroke/TIA will be minimized Outcome: Progressing

## 2020-12-17 NOTE — ED Notes (Signed)
Patient doing MRI screening at this time. 

## 2020-12-17 NOTE — Progress Notes (Signed)
PHARMACIST - PHYSICIAN COMMUNICATION  CONCERNING:  Enoxaparin (Lovenox) for DVT Prophylaxis    RECOMMENDATION: Patient was prescribed enoxaparin 40mg  q24 hours for VTE prophylaxis.   Filed Weights   12/17/20 1728 12/17/20 1743  Weight: 111.1 kg (245 lb) 116.4 kg (256 lb 9.9 oz)    Body mass index is 32.07 kg/m.  Estimated Creatinine Clearance: 91.5 mL/min (by C-G formula based on SCr of 0.96 mg/dL).   Based on [place] patient is candidate for enoxaparin 0.5mg /kg TBW SQ every 24 hours based on BMI being >30.   DESCRIPTION: Pharmacy has adjusted enoxaparin dose per [place] policy.  Patient is now receiving enoxaparin 57.5 mg every 24 hours    Benita Gutter 12/17/2020 6:44 PM

## 2020-12-17 NOTE — H&P (Signed)
History and Physical    Jackson Powers [id number] DOB: 1945/12/12 DOA: 12/17/2020  PCP: Cyndi Bender, PA-C  Patient coming from: Home, daughter who works as Therapist, sports here is at bedsdie  I have personally briefly reviewed patient's old medical records in [place]  Chief Complaint: dizziness and right sided numbness and weakness  HPI: Jackson Powers is a 75 y.o. male with medical history significant for hypertension, hyperlipidemia, and unstable angina who presents with concerns of dizziness and right-sided numbness and weakness.  Around 3 PM today he was doing some mechanical work when he suddenly felt very dizzy with blurred vision and noted right-sided numbness and weakness.  Right side was so weak he had difficulty ambulating.  Also felt like he has some difficulty with speech.  Wife was there to witness the episode and did not noticed any facial drooping.  He also endorsed that about 3 days ago he also had another episode of dizziness although had generalized weakness at that time and nothing focal.  Denies any chest pain, shortness of breath or palpitations.  He was brought in to the ED as a code stroke.  When he arrived his symptoms were slowly resolving.  He was evaluated by tele neurology.  No no intracranial hemorrhage or evidence of acute infarction.  No TPA was given due to mild symptoms.  No IR thrombectomy due to low NIH score.  Patient denies any tobacco, alcohol illicit drug use.   Review of Systems:  Constitutional: No Weight Change, No Fever ENT/Mouth: No sore throat, No Rhinorrhea Eyes: No Eye Pain,+ Vision Changes Cardiovascular: No Chest Pain, no SOB,  No Palpitations Respiratory: No Cough, No Sputum, No Wheezing, no Dyspnea  Gastrointestinal: No Nausea, No Vomiting, No Diarrhea, No Constipation, No Pain Genitourinary: no Urinary Incontinence,  Musculoskeletal: No Arthralgias, No Myalgias Skin: No Skin Lesions, No Pruritus, Neuro: + Weakness, + Numbness,  No Loss of  Consciousness, No Syncope Psych: No Anxiety/Panic, No Depression, no decrease appetite Heme/Lymph: No Bruising, No Bleeding   Past Medical History:  Diagnosis Date  . Acute gastritis without hemorrhage 2007  . Arthritis    osteoarthritis   . Benign neoplasm of colon 2007  . Esophagitis 2007  . Hypertension   . Orthopnea    reports " i have trouble breathing when i lie completely flat. i have to use 2 people to prop my head up"   . Panic anxiety syndrome    patient reports having severe panic anxiety and claustraphobia. states with ear surgery years ago , they had to tie him down which caused him to panic even more;   . Right BBB/left ant fasc block    (OLD)    Past Surgical History:  Procedure Laterality Date  . LEFT HEART CATH AND CORONARY ANGIOGRAPHY Left 10/01/2020   Procedure: LEFT HEART CATH AND CORONARY ANGIOGRAPHY;  Surgeon: Dionisio David, MD;  Location: [facility];  Service: Cardiovascular;  Laterality: Left;  . SKIN CANCER EXCISION Right 2017   EAR; FAILED GRAFT FOLLWOING SURGERY   . TOTAL HIP ARTHROPLASTY Right 07/20/2018   Procedure: RIGHT TOTAL HIP ARTHROPLASTY ANTERIOR APPROACH;  Surgeon: Gaynelle Arabian, MD;  Location: [facility];  Service: Orthopedics;  Laterality: Right;     reports that he quit smoking about 33 years ago. His smoking use included cigarettes. He has a 30.00 pack-year smoking history. He has never used smokeless tobacco. He reports previous alcohol use. He reports that he does not use drugs. Social History  No  Known Allergies  No family history on file.   Prior to Admission medications   Medication Sig Start Date End Date Taking? Authorizing Provider  aspirin EC 81 MG tablet Take 81 mg by mouth daily. Swallow whole.    [provider]  lisinopril (ZESTRIL) 10 MG tablet Take 20 mg by mouth daily.    [provider]    Physical Exam: Vitals:   12/17/20 1900 12/17/20 1930 12/17/20 2030 12/17/20 2120  BP: (!) 176/67  (!) 170/88 (!) 142/75 (!) 165/78  Pulse: 74 74 78 73  Resp: 14 (!) 22 19 18   Temp:    98 F (36.7 C)  TempSrc:      SpO2: 95% 93% 94% 99%  Weight:      Height:        Constitutional: NAD, calm, comfortable, well-appearing elderly gentleman sitting at 40 degree incline in bed Vitals:   12/17/20 1900 12/17/20 1930 12/17/20 2030 12/17/20 2120  BP: (!) 176/67 (!) 170/88 (!) 142/75 (!) 165/78  Pulse: 74 74 78 73  Resp: 14 (!) 22 19 18   Temp:    98 F (36.7 C)  TempSrc:      SpO2: 95% 93% 94% 99%  Weight:      Height:       Eyes: PERRL, lids and conjunctivae normal ENMT: Mucous membranes are moist.  Neck: normal, supple Respiratory: clear to auscultation bilaterally, no wheezing, no crackles. Normal respiratory effort. No accessory muscle use.  Cardiovascular: Regular rate and rhythm, no murmurs / rubs / gallops. No extremity edema. 2+ pedal pulses. No carotid bruits.  Abdomen: no tenderness, no masses palpated. . Bowel sounds positive.  Musculoskeletal: no clubbing / cyanosis. No joint deformity upper and lower extremities. Good ROM, no contractures. Normal muscle tone.  Skin: no rashes, lesions, ulcers. No induration Neurologic: CN 2-12 grossly intact. Sensation intact, . Strength 5/5 in all 4.  Intact heel-to-shin.  Intact finger-to-nose.  No nystagmus and intact extraocular motion. Psychiatric: Normal judgment and insight. Alert and oriented x 3. Normal mood.     Labs on Admission: I have personally reviewed following labs and imaging studies  CBC: Recent Labs  Lab 12/17/20 1729  WBC 8.7  NEUTROABS 5.4  HGB 14.4  HCT 42.4  MCV 89.1  PLT 621   Basic Metabolic Panel: Recent Labs  Lab 12/17/20 1729  NA 138  K 4.3  CL 102  CO2 26  GLUCOSE 119*  BUN 21  CREATININE 0.96  CALCIUM 9.7   GFR: Estimated Creatinine Clearance: 91.5 mL/min (by C-G formula based on SCr of 0.96 mg/dL). Liver Function Tests: Recent Labs  Lab 12/17/20 1729  AST 34  ALT 31  ALKPHOS  36*  BILITOT 0.8  PROT 7.6  ALBUMIN 4.4   No results for input(s): LIPASE, AMYLASE in the last 168 hours. No results for input(s): AMMONIA in the last 168 hours. Coagulation Profile: Recent Labs  Lab 12/17/20 1729  INR 1.1   Cardiac Enzymes: No results for input(s): CKTOTAL, CKMB, CKMBINDEX, TROPONINI in the last 168 hours. BNP (last 3 results) No results for input(s): PROBNP in the last 8760 hours. HbA1C: No results for input(s): HGBA1C in the last 72 hours. CBG: Recent Labs  Lab 12/17/20 1728  GLUCAP 130*   Lipid Profile: No results for input(s): CHOL, HDL, LDLCALC, TRIG, CHOLHDL, LDLDIRECT in the last 72 hours. Thyroid Function Tests: No results for input(s): TSH, T4TOTAL, FREET4, T3FREE, THYROIDAB in the last 72 hours. Anemia Panel: No results for  input(s): VITAMINB12, FOLATE, FERRITIN, TIBC, IRON, RETICCTPCT in the last 72 hours. Urine analysis: No results found for: COLORURINE, APPEARANCEUR, LABSPEC, [place], GLUCOSEU, HGBUR, BILIRUBINUR, KETONESUR, PROTEINUR, UROBILINOGEN, NITRITE, LEUKOCYTESUR  Radiological Exams on Admission: CT HEAD CODE STROKE WO CONTRAST  Result Date: 12/17/2020 CLINICAL DATA:  Code stroke.  Right-sided weakness EXAM: CT HEAD WITHOUT CONTRAST TECHNIQUE: Contiguous axial images were obtained from the base of the skull through the vertex without intravenous contrast. COMPARISON:  08/22/2020 FINDINGS: Brain: There is no acute intracranial hemorrhage, mass effect, or edema. Gray-white differentiation is preserved. Patchy hypoattenuation in the supratentorial white matter is nonspecific but probably reflects stable chronic microvascular ischemic changes. Ventricles and sulci are stable in size and configuration. There is no extra-axial collection. Vascular: No definite hyperdense vessel. Increased density along the left MCA is favored to be artifactual. There is intracranial atherosclerotic calcification at the skull base. Skull: Unremarkable.  Sinuses/Orbits: No acute abnormality Other: Mastoid air cells are clear. ASPECTS ([place] Stroke Program Early CT Score) - Ganglionic level infarction (caudate, lentiform nuclei, internal capsule, insula, M1-M3 cortex): 7 - Supraganglionic infarction (M4-M6 cortex): 3 Total score (0-10 with 10 being normal): 10 IMPRESSION: There is no acute intracranial hemorrhage or evidence of acute infarction. ASPECT score is 10. These results were called by telephone at the time of interpretation on 12/17/2020 at 5:46 pm to provider [facility] , who verbally acknowledged these results. Electronically Signed   By: Macy Mis M.D.   On: 12/17/2020 17:48      Assessment/Plan Right-sided weakness/paresthesia concerning for TIA Patient presented as code stroke for right-sided weakness which resolved in the ED.  No TPA given due to mild symptoms.  No IR thrombectomy due to low NIH score CT head negative - Tele neuro recommends further TIA workup - obtain MRI and MRA brain - obtain carotid doppler - echocardiogram  -Obtain A1c and lipids -PT/OT/SLT -Frequent neuro checks and keep on telemetry -Allow for permissive hypertension with blood pressure treatment as needed only if systolic goes above 191 -Prophylactic therapy-Antiplatelet med: Aspirin - 81mg  daily and plavix 75mg  following 300mg  load  Hypertension Hold antihypertensive.  Allow for permissive hypertension during stroke work-up as stated above  DVT prophylaxis:.Lovenox Code Status: Full Family Communication: Plan discussed with patient and daughter at bedside  disposition Plan: Home with at least 2 midnight stays  Consults called: tele neuro Admission status: inpatient    Level of care: Progressive Cardiac  Status is: Inpatient  Remains inpatient appropriate because:Inpatient level of care appropriate due to severity of illness   Dispo: The patient is from: Home              Anticipated d/c is to: Home              Anticipated  d/c date is: 2 days              Patient currently is not medically stable to d/c.   Difficult to place patient No         Orene Desanctis DO [facility]   If 7PM-7AM, please contact night-coverage [email or web address]   12/17/2020, 10:19 PM

## 2020-12-18 ENCOUNTER — Inpatient Hospital Stay: Payer: BC Managed Care – PPO

## 2020-12-18 ENCOUNTER — Encounter: Payer: Self-pay | Admitting: Family Medicine

## 2020-12-18 ENCOUNTER — Observation Stay
Admit: 2020-12-18 | Discharge: 2020-12-18 | Disposition: A | Discharge status: Home / Self Care | Payer: BC Managed Care – PPO | Attending: Family Medicine | Admitting: Family Medicine

## 2020-12-18 DIAGNOSIS — I10 Essential (primary) hypertension: Secondary | ICD-10-CM

## 2020-12-18 DIAGNOSIS — I6523 Occlusion and stenosis of bilateral carotid arteries: Secondary | ICD-10-CM | POA: Diagnosis not present

## 2020-12-18 DIAGNOSIS — G459 Transient cerebral ischemic attack, unspecified: Secondary | ICD-10-CM | POA: Diagnosis not present

## 2020-12-18 LAB — LIPID PANEL
Cholesterol: 103 mg/dL (ref 0–200)
HDL: 42 mg/dL (ref >40)
LDL Cholesterol: 48 mg/dL (ref 0–99)
Total CHOL/HDL Ratio: 2.5 RATIO
Triglycerides: 66 mg/dL (ref <150)
VLDL: 13 mg/dL (ref 0–40)

## 2020-12-18 LAB — ECHOCARDIOGRAM COMPLETE
Area-P 1/2: 3.72 cm2
Height: 75 in
S' Lateral: 3.17 cm
Weight: 3883.2 oz

## 2020-12-18 LAB — HEMOGLOBIN A1C
Hgb A1c MFr Bld: 5.7 % — ABNORMAL HIGH (ref 4.8–5.6)
Mean Plasma Glucose: 116.89 mg/dL

## 2020-12-18 MED ORDER — AMLODIPINE BESYLATE 5 MG PO TABS
5.0000 mg | ORAL_TABLET | Freq: Every day | ORAL | Status: DC
Start: 2020-12-18 — End: 2020-12-19
  Administered 2020-12-18: 5 mg via ORAL
  Filled 2020-12-18: qty 1

## 2020-12-18 MED ORDER — CLOPIDOGREL BISULFATE 75 MG PO TABS: 75.0000 mg | ORAL_TABLET | Freq: Every day | ORAL | 0 refills | Status: AC

## 2020-12-18 MED ORDER — ATORVASTATIN CALCIUM 40 MG PO TABS: 40.0000 mg | ORAL_TABLET | Freq: Every day | ORAL | 0 refills | Status: DC

## 2020-12-18 MED ORDER — ASPIRIN EC 81 MG PO TBEC: 81.0000 mg | DELAYED_RELEASE_TABLET | Freq: Every day | ORAL | Status: AC

## 2020-12-18 MED ORDER — AMLODIPINE BESYLATE 5 MG PO TABS: 5.0000 mg | ORAL_TABLET | Freq: Every day | ORAL | 0 refills | Status: DC

## 2020-12-18 MED ORDER — LISINOPRIL 20 MG PO TABS
20.0000 mg | ORAL_TABLET | Freq: Every day | ORAL | Status: DC
Start: 2020-12-18 — End: 2020-12-19
  Administered 2020-12-18: 20 mg via ORAL
  Filled 2020-12-18: qty 1

## 2020-12-18 NOTE — Progress Notes (Signed)
Orders placed for discharge. Discharge education performed with wife at bedside, she verbalized understanding. PIV removed. Pt discharged to home

## 2020-12-18 NOTE — Discharge Summary (Signed)
Physician Discharge Summary  Jackson Powers [id number] DOB: [birth year] DOA: 12/17/2020  PCP: Cyndi Bender, PA-C  Admit date: 12/17/2020 Discharge date: 12/18/2020  Discharge disposition: Home   Recommendations for Outpatient Follow-Up:   Follow-up with PCP in 1 week Follow-up with neurologist in 1 month   Discharge Diagnosis:   Principal Problem:   TIA (transient ischemic attack) Active Problems:   Essential hypertension    Discharge Condition: Stable.  Diet recommendation:  Diet Order            Diet - low sodium heart healthy           Diet Heart Room service appropriate? Yes; Fluid consistency: Thin  Diet effective now                   Code Status: Full Code     Hospital Course:   Mr. Jackson Powers is a 75 year old man with medical history significant for hypertension, hyperlipidemia, who presented to the hospital because of dizziness, right-sided numbness and weakness.  He said his symptoms lasted for about 40 minutes.  He was admitted to the hospital for further work-up.  MRI brain did not show any acute stroke but there was evidence of small remote lacunar infarct. The patient was not aware of any previous stroke.  He was taking low-dose aspirin at home.    He was diagnosed with TIA.  He was seen in consultation by the neurologist who recommended dual antiplatelet therapy with aspirin and Plavix for 3 weeks followed by Plavix monotherapy.  He was taking lisinopril for hypertension.  However, his blood pressure is uncontrolled.  Amlodipine was added for adequate BP control.  All his symptoms have resolved and he is deemed stable for discharge to home.  Discharge plan was discussed with the patient and his wife at the bedside.   ADDENDUM on 12/19/2020 at 4:23 PM  I called patient on his home phone at [phone] and I discussed echocardiogram report with the patient.   Medical Consultants:    Neurologist   Discharge Exam:    Vitals:   12/18/20 [id number]  12/18/20 0812 12/18/20 1149 12/18/20 1528  BP: (!) 180/79 (!) 191/87 (!) 167/83 (!) 165/77  Pulse: 63 68 62 70  Resp: 17 16 16 16   Temp: 97.8 F (36.6 C) 97.9 F (36.6 C) 97.7 F (36.5 C) 98.3 F (36.8 C)  TempSrc: Oral Oral  Oral  SpO2: 98% 97% 96% 98%  Weight:      Height:         GEN: NAD SKIN: No rash EYES: EOMI ENT: MMM CV: RRR PULM: CTA B ABD: soft, ND, NT, +BS CNS: AAO x 3, non focal EXT: No edema or tenderness   The results of significant diagnostics from this hospitalization (including imaging, microbiology, ancillary and laboratory) are listed below for reference.     Procedures and Diagnostic Studies:   US Carotid Bilateral (at [facility] and [facility] only)  Result Date: 12/18/2020 CLINICAL DATA:  Right upper extremity paresthesias, hypertension and hyperlipidemia EXAM: BILATERAL CAROTID DUPLEX ULTRASOUND TECHNIQUE: Pearline Cables scale imaging, color Doppler and duplex ultrasound were performed of bilateral carotid and vertebral arteries in the neck. COMPARISON:  None. FINDINGS: Criteria: Quantification of carotid stenosis is based on velocity parameters that correlate the residual internal carotid diameter with NASCET-based stenosis levels, using the diameter of the distal internal carotid lumen as the denominator for stenosis measurement. The following velocity measurements were obtained: RIGHT ICA: 80/17 cm/sec CCA: [id number] cm/sec SYSTOLIC ICA/CCA RATIO:  0.7 ECA: 155 cm/sec LEFT ICA: 141/18 cm/sec CCA: [id number] cm/sec SYSTOLIC ICA/CCA RATIO:  0.9 ECA: 131 cm/sec RIGHT CAROTID ARTERY: Moderate heterogeneous calcified right carotid bifurcation atherosclerosis. Despite this, there is no hemodynamically significant right ICA stenosis, velocity elevation, turbulent flow. Degree of narrowing less than 50% by ultrasound criteria. RIGHT VERTEBRAL ARTERY:  Normal antegrade flow LEFT CAROTID ARTERY: Similar moderate calcified left carotid bifurcation atherosclerosis. No hemodynamically significant left  ICA stenosis, velocity elevation, turbulent flow. Degree of narrowing also less than 50% by ultrasound criteria. LEFT VERTEBRAL ARTERY:  Normal antegrade flow IMPRESSION: Moderate bilateral carotid atherosclerosis. No hemodynamically significant ICA stenosis. Degree of narrowing less than 50% bilaterally by ultrasound criteria. Patent antegrade vertebral flow bilaterally Electronically Signed   By: Jerilynn Mages.  Shick M.D.   On: 12/18/2020 11:57   ECHOCARDIOGRAM COMPLETE  Result Date: 12/18/2020    ECHOCARDIOGRAM REPORT   Patient Name:   Jackson Powers Date of Exam: 12/18/2020 Medical Rec #:  [id number]   Height:       75.0 in Accession #:    [id number]  Weight:       242.7 lb Date of Birth:  August 19, 1946   BSA:          2.382 m Patient Age:    72 years    BP:           165/77 mmHg Patient Gender: M           HR:           71 bpm. Exam Location:  [facility] Procedure: 2D Echo, Cardiac Doppler and Color Doppler Indications:     G45.9 TIA  History:         Patient has no prior history of Echocardiogram examinations.                  Risk Factors:Hypertension. RBBB.  Sonographer:     Wilford Sports Rodgers-Jones Referring Phys:  [id number] [place] [facility] Diagnosing Phys: Neoma Laming MD IMPRESSIONS  1. Left ventricular ejection fraction, by estimation, is 60 to 65%. The left ventricle has normal function. The left ventricle has no regional wall motion abnormalities. There is mild concentric left ventricular hypertrophy. Left ventricular diastolic parameters are consistent with Grade I diastolic dysfunction (impaired relaxation).  2. Right ventricular systolic function is normal. The right ventricular size is normal.  3. The mitral valve is normal in structure. Trivial mitral valve regurgitation. No evidence of mitral stenosis.  4. The aortic valve is normal in structure. Aortic valve regurgitation is not visualized. Mild aortic valve sclerosis is present, with no evidence of aortic valve stenosis.  5. The inferior vena cava is normal in size with  greater than 50% respiratory variability, suggesting right atrial pressure of 3 mmHg. FINDINGS  Left Ventricle: Left ventricular ejection fraction, by estimation, is 60 to 65%. The left ventricle has normal function. The left ventricle has no regional wall motion abnormalities. The left ventricular internal cavity size was normal in size. There is  mild concentric left ventricular hypertrophy. Left ventricular diastolic parameters are consistent with Grade I diastolic dysfunction (impaired relaxation). Right Ventricle: The right ventricular size is normal. No increase in right ventricular wall thickness. Right ventricular systolic function is normal. Left Atrium: Left atrial size was normal in size. Right Atrium: Right atrial size was normal in size. Pericardium: There is no evidence of pericardial effusion. Mitral Valve: The mitral valve is normal in structure. Trivial mitral valve regurgitation. No evidence of mitral valve stenosis. Tricuspid Valve: The tricuspid  valve is normal in structure. Tricuspid valve regurgitation is trivial. No evidence of tricuspid stenosis. Aortic Valve: The aortic valve is normal in structure. Aortic valve regurgitation is not visualized. Mild aortic valve sclerosis is present, with no evidence of aortic valve stenosis. Pulmonic Valve: The pulmonic valve was normal in structure. Pulmonic valve regurgitation is not visualized. No evidence of pulmonic stenosis. Aorta: The aortic root is normal in size and structure. Venous: The inferior vena cava is normal in size with greater than 50% respiratory variability, suggesting right atrial pressure of 3 mmHg. IAS/Shunts: No atrial level shunt detected by color flow Doppler.  LEFT VENTRICLE PLAX 2D LVIDd:         5.06 cm  Diastology LVIDs:         3.17 cm  LV e' medial:    6.53 cm/s LV PW:         1.17 cm  LV E/e' medial:  12.6 LV IVS:        1.18 cm  LV e' lateral:   7.40 cm/s LVOT diam:     2.20 cm  LV E/e' lateral: 11.1 LV SV:         97 LV  SV Index:   41 LVOT Area:     3.80 cm  RIGHT VENTRICLE             IVC RV Basal diam:  3.52 cm     IVC diam: 1.96 cm RV S prime:     16.00 cm/s TAPSE (M-mode): 2.6 cm LEFT ATRIUM             Index       RIGHT ATRIUM           Index LA diam:        4.60 cm 1.93 cm/m  RA Area:     12.00 cm LA Vol (A2C):   46.9 ml 19.69 ml/m RA Volume:   27.00 ml  11.33 ml/m LA Vol (A4C):   39.4 ml 16.54 ml/m LA Biplane Vol: 45.1 ml 18.93 ml/m  AORTIC VALVE LVOT Vmax:   133.00 cm/s LVOT Vmean:  85.000 cm/s LVOT VTI:    0.254 m  AORTA Ao Root diam: 3.60 cm Ao Asc diam:  3.80 cm MITRAL VALVE MV Area (PHT): 3.72 cm    SHUNTS MV Decel Time: 204 msec    Systemic VTI:  0.25 m MV E velocity: 82.50 cm/s  Systemic Diam: 2.20 cm MV A velocity: 92.70 cm/s MV E/A ratio:  0.89 Adrian Blackwater MD Electronically signed by Adrian Blackwater MD Signature Date/Time: 12/18/2020/8:12:29 PM    Final      Labs:   Basic Metabolic Panel: Recent Labs  Lab 12/17/20 1729  NA 138  K 4.3  CL 102  CO2 26  GLUCOSE 119*  BUN 21  CREATININE 0.96  CALCIUM 9.7   GFR Estimated Creatinine Clearance: 89.1 mL/min (by C-G formula based on SCr of 0.96 mg/dL). Liver Function Tests: Recent Labs  Lab 12/17/20 1729  AST 34  ALT 31  ALKPHOS 36*  BILITOT 0.8  PROT 7.6  ALBUMIN 4.4   No results for input(s): LIPASE, AMYLASE in the last 168 hours. No results for input(s): AMMONIA in the last 168 hours. Coagulation profile Recent Labs  Lab 12/17/20 1729  INR 1.1    CBC: Recent Labs  Lab 12/17/20 1729  WBC 8.7  NEUTROABS 5.4  HGB 14.4  HCT 42.4  MCV 89.1  PLT 216   Cardiac Enzymes: No results  for input(s): CKTOTAL, CKMB, CKMBINDEX, TROPONINI in the last 168 hours. BNP: Invalid input(s): POCBNP CBG: Recent Labs  Lab 12/17/20 1728  GLUCAP 130*   D-Dimer No results for input(s): DDIMER in the last 72 hours. Hgb A1c Recent Labs    12/18/20 0517  HGBA1C 5.7*   Lipid Profile Recent Labs    12/18/20 0517  CHOL 103  HDL  42  LDLCALC 48  TRIG 66  CHOLHDL 2.5   Thyroid function studies No results for input(s): TSH, T4TOTAL, T3FREE, THYROIDAB in the last 72 hours.  Invalid input(s): FREET3 Anemia work up No results for input(s): VITAMINB12, FOLATE, FERRITIN, TIBC, IRON, RETICCTPCT in the last 72 hours. Microbiology Recent Results (from the past 240 hour(s))  SARS Coronavirus 2 by RT PCR (hospital order, performed in [facility] hospital lab) Nasopharyngeal Nasopharyngeal Swab     Status: None   Collection Time: 12/17/20  6:24 PM   Specimen: Nasopharyngeal Swab  Result Value Ref Range Status   SARS Coronavirus 2 NEGATIVE NEGATIVE Final    Comment: (NOTE) SARS-CoV-2 target nucleic acids are NOT DETECTED.  The SARS-CoV-2 RNA is generally detectable in upper and lower respiratory specimens during the acute phase of infection. The lowest concentration of SARS-CoV-2 viral copies this assay can detect is 250 copies / mL. A negative result does not preclude SARS-CoV-2 infection and should not be used as the sole basis for treatment or other patient management decisions.  A negative result may occur with improper specimen collection / handling, submission of specimen other than nasopharyngeal swab, presence of viral mutation(s) within the areas targeted by this assay, and inadequate number of viral copies (<250 copies / mL). A negative result must be combined with clinical observations, patient history, and epidemiological information.  Fact Sheet for Patients:   StrictlyIdeas.no  Fact Sheet for Healthcare Providers: BankingDealers.co.za  This test is not yet approved or  cleared by the [place] FDA and has been authorized for detection and/or diagnosis of SARS-CoV-2 by FDA under an Emergency Use Authorization (EUA).  This EUA will remain in effect (meaning this test can be used) for the duration of the COVID-19 declaration under Section 564(b)(1) of  the Act, 21 U.S.C. section 360bbb-3(b)(1), unless the authorization is terminated or revoked sooner.  Performed at [facility], [address], [place], [place] [address]      Discharge Instructions:   Discharge Instructions    Diet - low sodium heart healthy   Complete by: As directed    Increase activity slowly   Complete by: As directed      Allergies as of 12/18/2020   No Known Allergies     Medication List    TAKE these medications   amLODipine 5 MG tablet Commonly known as: NORVASC Take 1 tablet (5 mg total) by mouth daily. Start taking on: December 19, 2020   aspirin EC 81 MG tablet Take 1 tablet (81 mg total) by mouth daily for 21 days. Swallow whole.   atorvastatin 40 MG tablet Commonly known as: LIPITOR Take 1 tablet (40 mg total) by mouth daily. Start taking on: December 19, 2020   clopidogrel 75 MG tablet Commonly known as: PLAVIX Take 1 tablet (75 mg total) by mouth daily. Start taking on: December 19, 2020   lisinopril 10 MG tablet Commonly known as: ZESTRIL Take 20 mg by mouth daily.       Follow-up Information    [facility]. Schedule an appointment as soon as possible  for a visit in 1 month(s).   Contact information: [place] [phone]               Time coordinating discharge: 32 minutes  Signed:  Jennye Boroughs  [facility] 12/18/2020, 8:35 PM   Pager on [email or web address]. If 7PM-7AM, please contact night-coverage at [email or web address]

## 2020-12-18 NOTE — Care Management Obs Status (Signed)
[facility] NOTIFICATION   Patient Details  Name: Jackson Powers MRN: [id number] Date of Birth: January 20, 1946   Medicare Observation Status Notification Given:       Kerin Salen, RN 12/18/2020, 12:30 PM

## 2020-12-18 NOTE — Care Management Obs Status (Signed)
[facility] NOTIFICATION   Patient Details  Name: Alok Minshall MRN: [id number] Date of Birth: [birth year]   Medicare Observation Status Notification Given:       Kerin Salen, RN 12/18/2020, 12:52 PM

## 2020-12-18 NOTE — Care Management CC44 (Signed)
Condition Code 44 Documentation Completed  Patient Details  Name: Asahel Risden MRN: [id number] Date of Birth: 1946/07/30   Condition Code 44 given:  Yes Patient signature on Condition Code 44 notice:  Yes Documentation of 2 MD's agreement:  Yes Code 44 added to claim:  Yes    Kerin Salen, RN 12/18/2020, 12:53 PM

## 2020-12-18 NOTE — Progress Notes (Signed)
Subjective: Feels completely back to baseline  Exam: Vitals:   12/18/20 0812 12/18/20 1149  BP: (!) 191/87 (!) 167/83  Pulse: 68 62  Resp: 16 16  Temp: 97.9 F (36.6 C) 97.7 F (36.5 C)  SpO2: 97% 96%   Gen: In bed, NAD Resp: non-labored breathing, no acute distress Abd: soft, nt  Neuro: MS: Awake, alert, interactive and appropriate CN: Pupils equal round and reactive, extra ocular movements intact Motor: No drift Sensory: Intact to light touch  Pertinent Labs: LDL 48  Impression: 75 year old male with a history of hypertension who presents with transient right-sided numbness and weakness most consistent with transient ischemic attack.  He does have evidence of previous stroke on his MRI, but nothing acute.  He has an occluded vertebral artery, with no acute findings on MRI this is very possibly chronic.  He does report intermittent lightheadedness and though this could be contributory, no specific intervention other than avoidance of hypotension would be warranted.  Recommendations: 1) carotid ultrasound, may need vascular consultation if he has left carotid stenosis 2) aspirin 81 mg daily and Plavix 75 mg daily for 3 weeks followed by Plavix monotherapy 3) echocardiogram 4) telemetry 5) if no embolic source on telemetry, echo, and carotids are negative, could be discharged from my standpoint.  Roland Rack, MD [facility] [phone]  If 7pm- 7am, please page neurology on call as listed in [place].

## 2020-12-18 NOTE — Progress Notes (Signed)
PT Cancellation Note  Patient Details Name: Jackson Powers MRN: [id number] DOB: 12/18/1945   Cancelled Treatment:    Reason Eval/Treat Not Completed: Other (comment). Consult received and chart reviewed. Pt reports he is back to baseline with only little complaint of headache. Coordination/sensation intact. Balanced gait with ability to complete turns without difficulty. Pt reports no needs. Will complete consult and dc in house.   Geryl Dohn 12/18/2020, 9:40 AM  Greggory Stallion, PT, DPT [phone]

## 2020-12-18 NOTE — Progress Notes (Signed)
SLP Cancellation Note  Patient Details Name: Jackson Powers MRN: [id number] DOB: [birth year]   Cancelled treatment:       Reason Eval/Treat Not Completed: SLP screened, no needs identified, will sign off (chart reviewed; consulted NSG then met w/ pt/Wife in room). Pt denied any difficulty swallowing and is currently on a regular diet; tolerates swallowing pills w/ water per NSG. Pt conversed at conversational level w/out deficits noted; pt and Wife denied any speech-language deficits. Very social.  No further skilled ST services indicated as pt appears at his baseline. Pt agreed. NSG to reconsult if any change in status.      Orinda Kenner, MS, CCC-SLP Speech Language Pathologist [facility] [phone] [facility] 12/18/2020, 10:28 AM

## 2020-12-18 NOTE — Progress Notes (Signed)
OT Cancellation Note  Patient Details Name: Jackson Powers MRN: [id number] DOB: [birth year]   Cancelled Treatment:    Reason Eval/Treat Not Completed: OT screened, no needs identified, will sign off. Order received and chart reviewed. Per conversation with care team members, pt reporting all symptoms have resolved and he is feeling back to baseline level of functional independence to perform functional mobility and ADL Management. Pt is up ad lib in his room and completing basic cares independently. No skilled OT needs identified. Will sign off at this time. Please re-consult if additional OT needs arise during this admission.   Shara Blazing, M.S., OTR/L Ascom: [phone] 12/18/20, 9:43 AM

## 2020-12-25 DIAGNOSIS — G459 Transient cerebral ischemic attack, unspecified: Secondary | ICD-10-CM | POA: Diagnosis not present

## 2020-12-25 DIAGNOSIS — I10 Essential (primary) hypertension: Secondary | ICD-10-CM | POA: Diagnosis not present

## 2020-12-25 DIAGNOSIS — E785 Hyperlipidemia, unspecified: Secondary | ICD-10-CM | POA: Diagnosis not present

## 2020-12-25 DIAGNOSIS — I2510 Atherosclerotic heart disease of native coronary artery without angina pectoris: Secondary | ICD-10-CM | POA: Diagnosis not present

## 2021-01-01 DIAGNOSIS — E782 Mixed hyperlipidemia: Secondary | ICD-10-CM | POA: Diagnosis not present

## 2021-01-01 DIAGNOSIS — R42 Dizziness and giddiness: Secondary | ICD-10-CM | POA: Diagnosis not present

## 2021-01-01 DIAGNOSIS — I10 Essential (primary) hypertension: Secondary | ICD-10-CM | POA: Diagnosis not present

## 2021-01-01 DIAGNOSIS — G459 Transient cerebral ischemic attack, unspecified: Secondary | ICD-10-CM | POA: Diagnosis not present

## 2021-01-03 DIAGNOSIS — Z6832 Body mass index (BMI) 32.0-32.9, adult: Secondary | ICD-10-CM | POA: Diagnosis not present

## 2021-01-03 DIAGNOSIS — I10 Essential (primary) hypertension: Secondary | ICD-10-CM | POA: Diagnosis not present

## 2021-01-03 DIAGNOSIS — I679 Cerebrovascular disease, unspecified: Secondary | ICD-10-CM | POA: Diagnosis not present

## 2021-01-03 DIAGNOSIS — R42 Dizziness and giddiness: Secondary | ICD-10-CM | POA: Diagnosis not present

## 2021-01-03 DIAGNOSIS — I2510 Atherosclerotic heart disease of native coronary artery without angina pectoris: Secondary | ICD-10-CM | POA: Diagnosis not present

## 2021-01-13 DIAGNOSIS — R42 Dizziness and giddiness: Secondary | ICD-10-CM | POA: Diagnosis not present

## 2021-01-13 DIAGNOSIS — G459 Transient cerebral ischemic attack, unspecified: Secondary | ICD-10-CM | POA: Diagnosis not present

## 2021-02-03 DIAGNOSIS — G459 Transient cerebral ischemic attack, unspecified: Secondary | ICD-10-CM | POA: Diagnosis not present

## 2021-02-03 DIAGNOSIS — R42 Dizziness and giddiness: Secondary | ICD-10-CM | POA: Diagnosis not present

## 2021-02-07 DIAGNOSIS — I679 Cerebrovascular disease, unspecified: Secondary | ICD-10-CM | POA: Diagnosis not present

## 2021-02-07 DIAGNOSIS — Z1331 Encounter for screening for depression: Secondary | ICD-10-CM | POA: Diagnosis not present

## 2021-02-07 DIAGNOSIS — I2510 Atherosclerotic heart disease of native coronary artery without angina pectoris: Secondary | ICD-10-CM | POA: Diagnosis not present

## 2021-02-07 DIAGNOSIS — Z9181 History of falling: Secondary | ICD-10-CM | POA: Diagnosis not present

## 2021-02-07 DIAGNOSIS — Z6833 Body mass index (BMI) 33.0-33.9, adult: Secondary | ICD-10-CM | POA: Diagnosis not present

## 2021-02-07 DIAGNOSIS — I10 Essential (primary) hypertension: Secondary | ICD-10-CM | POA: Diagnosis not present

## 2021-04-16 DIAGNOSIS — J069 Acute upper respiratory infection, unspecified: Secondary | ICD-10-CM | POA: Diagnosis not present

## 2021-05-08 ENCOUNTER — Other Ambulatory Visit: Payer: Self-pay | Admitting: Internal Medicine

## 2021-05-08 DIAGNOSIS — Z1211 Encounter for screening for malignant neoplasm of colon: Secondary | ICD-10-CM | POA: Diagnosis not present

## 2021-05-08 DIAGNOSIS — R1310 Dysphagia, unspecified: Secondary | ICD-10-CM

## 2021-05-08 DIAGNOSIS — K5900 Constipation, unspecified: Secondary | ICD-10-CM | POA: Diagnosis not present

## 2021-05-08 DIAGNOSIS — R1314 Dysphagia, pharyngoesophageal phase: Secondary | ICD-10-CM | POA: Diagnosis not present

## 2021-05-13 ENCOUNTER — Other Ambulatory Visit: Payer: Self-pay

## 2021-05-13 ENCOUNTER — Ambulatory Visit
Admission: RE | Admit: 2021-05-13 | Discharge: 2021-05-13 | Disposition: A | Discharge status: Home / Self Care | Payer: BC Managed Care – PPO | Source: Ambulatory Visit | Attending: Internal Medicine | Admitting: Internal Medicine

## 2021-05-13 DIAGNOSIS — R1310 Dysphagia, unspecified: Secondary | ICD-10-CM | POA: Diagnosis not present

## 2021-05-15 DIAGNOSIS — L02212 Cutaneous abscess of back [any part, except buttock]: Secondary | ICD-10-CM | POA: Diagnosis not present

## 2021-05-15 DIAGNOSIS — L02232 Carbuncle of back [any part, except buttock]: Secondary | ICD-10-CM | POA: Diagnosis not present

## 2021-05-23 DIAGNOSIS — K2970 Gastritis, unspecified, without bleeding: Secondary | ICD-10-CM | POA: Diagnosis not present

## 2021-05-23 DIAGNOSIS — D123 Benign neoplasm of transverse colon: Secondary | ICD-10-CM | POA: Diagnosis not present

## 2021-05-23 DIAGNOSIS — K224 Dyskinesia of esophagus: Secondary | ICD-10-CM | POA: Diagnosis not present

## 2021-05-23 DIAGNOSIS — Z1211 Encounter for screening for malignant neoplasm of colon: Secondary | ICD-10-CM | POA: Diagnosis not present

## 2021-05-23 DIAGNOSIS — R1310 Dysphagia, unspecified: Secondary | ICD-10-CM | POA: Diagnosis not present

## 2021-05-23 DIAGNOSIS — K209 Esophagitis, unspecified without bleeding: Secondary | ICD-10-CM | POA: Diagnosis not present

## 2021-05-23 DIAGNOSIS — R9389 Abnormal findings on diagnostic imaging of other specified body structures: Secondary | ICD-10-CM | POA: Diagnosis not present

## 2021-05-23 DIAGNOSIS — K635 Polyp of colon: Secondary | ICD-10-CM | POA: Diagnosis not present

## 2021-06-17 DIAGNOSIS — L720 Epidermal cyst: Secondary | ICD-10-CM | POA: Diagnosis not present

## 2021-06-17 DIAGNOSIS — D485 Neoplasm of uncertain behavior of skin: Secondary | ICD-10-CM | POA: Diagnosis not present

## 2021-06-30 DIAGNOSIS — L720 Epidermal cyst: Secondary | ICD-10-CM | POA: Diagnosis not present

## 2021-07-22 DIAGNOSIS — C44212 Basal cell carcinoma of skin of right ear and external auricular canal: Secondary | ICD-10-CM | POA: Diagnosis not present

## 2021-08-11 DIAGNOSIS — Z6832 Body mass index (BMI) 32.0-32.9, adult: Secondary | ICD-10-CM | POA: Diagnosis not present

## 2021-08-11 DIAGNOSIS — I2510 Atherosclerotic heart disease of native coronary artery without angina pectoris: Secondary | ICD-10-CM | POA: Diagnosis not present

## 2021-08-11 DIAGNOSIS — Z23 Encounter for immunization: Secondary | ICD-10-CM | POA: Diagnosis not present

## 2021-08-11 DIAGNOSIS — I10 Essential (primary) hypertension: Secondary | ICD-10-CM | POA: Diagnosis not present

## 2021-08-11 DIAGNOSIS — I679 Cerebrovascular disease, unspecified: Secondary | ICD-10-CM | POA: Diagnosis not present

## 2021-08-11 DIAGNOSIS — K219 Gastro-esophageal reflux disease without esophagitis: Secondary | ICD-10-CM | POA: Diagnosis not present

## 2021-08-13 ENCOUNTER — Encounter: Admission: RE | Payer: Self-pay | Source: Home / Self Care

## 2021-08-13 ENCOUNTER — Ambulatory Visit
Admission: RE | Admit: 2021-08-13 | Payer: BC Managed Care – PPO | Source: Home / Self Care | Admitting: Internal Medicine

## 2021-08-13 SURGERY — COLONOSCOPY WITH PROPOFOL
Anesthesia: General

## 2021-10-28 DIAGNOSIS — L570 Actinic keratosis: Secondary | ICD-10-CM | POA: Diagnosis not present

## 2022-02-05 DIAGNOSIS — Z20822 Contact with and (suspected) exposure to covid-19: Secondary | ICD-10-CM | POA: Diagnosis not present

## 2022-02-09 DIAGNOSIS — I2510 Atherosclerotic heart disease of native coronary artery without angina pectoris: Secondary | ICD-10-CM | POA: Diagnosis not present

## 2022-02-09 DIAGNOSIS — I679 Cerebrovascular disease, unspecified: Secondary | ICD-10-CM | POA: Diagnosis not present

## 2022-02-09 DIAGNOSIS — K219 Gastro-esophageal reflux disease without esophagitis: Secondary | ICD-10-CM | POA: Diagnosis not present

## 2022-02-09 DIAGNOSIS — Z125 Encounter for screening for malignant neoplasm of prostate: Secondary | ICD-10-CM | POA: Diagnosis not present

## 2022-02-09 DIAGNOSIS — Z6833 Body mass index (BMI) 33.0-33.9, adult: Secondary | ICD-10-CM | POA: Diagnosis not present

## 2022-02-09 DIAGNOSIS — I10 Essential (primary) hypertension: Secondary | ICD-10-CM | POA: Diagnosis not present

## 2022-03-23 DIAGNOSIS — Z20822 Contact with and (suspected) exposure to covid-19: Secondary | ICD-10-CM | POA: Diagnosis not present

## 2022-05-04 DIAGNOSIS — L578 Other skin changes due to chronic exposure to nonionizing radiation: Secondary | ICD-10-CM | POA: Diagnosis not present

## 2022-05-04 DIAGNOSIS — L570 Actinic keratosis: Secondary | ICD-10-CM | POA: Diagnosis not present

## 2022-05-04 DIAGNOSIS — L821 Other seborrheic keratosis: Secondary | ICD-10-CM | POA: Diagnosis not present

## 2022-07-09 DIAGNOSIS — K222 Esophageal obstruction: Secondary | ICD-10-CM | POA: Diagnosis not present

## 2022-07-09 DIAGNOSIS — R1319 Other dysphagia: Secondary | ICD-10-CM | POA: Diagnosis not present

## 2022-07-14 DIAGNOSIS — K2100 Gastro-esophageal reflux disease with esophagitis, without bleeding: Secondary | ICD-10-CM | POA: Diagnosis not present

## 2022-07-14 DIAGNOSIS — K2970 Gastritis, unspecified, without bleeding: Secondary | ICD-10-CM | POA: Diagnosis not present

## 2022-07-14 DIAGNOSIS — K222 Esophageal obstruction: Secondary | ICD-10-CM | POA: Diagnosis not present

## 2022-07-14 DIAGNOSIS — R1319 Other dysphagia: Secondary | ICD-10-CM | POA: Diagnosis not present

## 2022-07-14 DIAGNOSIS — R1013 Epigastric pain: Secondary | ICD-10-CM | POA: Diagnosis not present

## 2022-07-14 DIAGNOSIS — K224 Dyskinesia of esophagus: Secondary | ICD-10-CM | POA: Diagnosis not present

## 2022-08-06 DIAGNOSIS — I2510 Atherosclerotic heart disease of native coronary artery without angina pectoris: Secondary | ICD-10-CM | POA: Diagnosis not present

## 2022-08-06 DIAGNOSIS — Z1331 Encounter for screening for depression: Secondary | ICD-10-CM | POA: Diagnosis not present

## 2022-08-06 DIAGNOSIS — Z23 Encounter for immunization: Secondary | ICD-10-CM | POA: Diagnosis not present

## 2022-08-06 DIAGNOSIS — J019 Acute sinusitis, unspecified: Secondary | ICD-10-CM | POA: Diagnosis not present

## 2022-08-06 DIAGNOSIS — I10 Essential (primary) hypertension: Secondary | ICD-10-CM | POA: Diagnosis not present

## 2022-08-06 DIAGNOSIS — Z9181 History of falling: Secondary | ICD-10-CM | POA: Diagnosis not present

## 2022-08-06 DIAGNOSIS — K602 Anal fissure, unspecified: Secondary | ICD-10-CM | POA: Diagnosis not present

## 2022-08-06 DIAGNOSIS — I679 Cerebrovascular disease, unspecified: Secondary | ICD-10-CM | POA: Diagnosis not present

## 2022-08-06 DIAGNOSIS — Z139 Encounter for screening, unspecified: Secondary | ICD-10-CM | POA: Diagnosis not present

## 2022-08-20 DIAGNOSIS — K449 Diaphragmatic hernia without obstruction or gangrene: Secondary | ICD-10-CM | POA: Diagnosis not present

## 2022-08-20 DIAGNOSIS — K2289 Other specified disease of esophagus: Secondary | ICD-10-CM | POA: Diagnosis not present

## 2022-08-20 DIAGNOSIS — R1314 Dysphagia, pharyngoesophageal phase: Secondary | ICD-10-CM | POA: Diagnosis not present

## 2022-10-26 DIAGNOSIS — L570 Actinic keratosis: Secondary | ICD-10-CM | POA: Diagnosis not present

## 2022-12-16 DIAGNOSIS — S43402A Unspecified sprain of left shoulder joint, initial encounter: Secondary | ICD-10-CM | POA: Diagnosis not present

## 2022-12-30 DIAGNOSIS — M542 Cervicalgia: Secondary | ICD-10-CM | POA: Diagnosis not present

## 2022-12-30 DIAGNOSIS — M25512 Pain in left shoulder: Secondary | ICD-10-CM | POA: Diagnosis not present

## 2023-01-26 DIAGNOSIS — M25512 Pain in left shoulder: Secondary | ICD-10-CM | POA: Diagnosis not present

## 2023-01-26 DIAGNOSIS — M542 Cervicalgia: Secondary | ICD-10-CM | POA: Diagnosis not present

## 2023-02-05 DIAGNOSIS — R6889 Other general symptoms and signs: Secondary | ICD-10-CM | POA: Diagnosis not present

## 2023-02-05 DIAGNOSIS — J019 Acute sinusitis, unspecified: Secondary | ICD-10-CM | POA: Diagnosis not present

## 2023-04-08 DIAGNOSIS — J019 Acute sinusitis, unspecified: Secondary | ICD-10-CM | POA: Diagnosis not present

## 2023-04-08 DIAGNOSIS — I679 Cerebrovascular disease, unspecified: Secondary | ICD-10-CM | POA: Diagnosis not present

## 2023-04-08 DIAGNOSIS — Z125 Encounter for screening for malignant neoplasm of prostate: Secondary | ICD-10-CM | POA: Diagnosis not present

## 2023-04-08 DIAGNOSIS — R7303 Prediabetes: Secondary | ICD-10-CM | POA: Diagnosis not present

## 2023-04-08 DIAGNOSIS — I10 Essential (primary) hypertension: Secondary | ICD-10-CM | POA: Diagnosis not present

## 2023-04-08 DIAGNOSIS — I2510 Atherosclerotic heart disease of native coronary artery without angina pectoris: Secondary | ICD-10-CM | POA: Diagnosis not present

## 2023-04-08 DIAGNOSIS — R059 Cough, unspecified: Secondary | ICD-10-CM | POA: Diagnosis not present

## 2023-04-15 ENCOUNTER — Other Ambulatory Visit: Payer: Self-pay | Admitting: Physician Assistant

## 2023-04-15 DIAGNOSIS — R911 Solitary pulmonary nodule: Secondary | ICD-10-CM

## 2023-04-20 IMAGING — RF DG ESOPHAGUS
10 of 11 series · 14 of 24 positions shown · non-contrast
Comparison: None.

CLINICAL DATA: Intermittent difficulty swallowing with sensation of
things getting stuck.

EXAM:
ESOPHOGRAM / BARIUM SWALLOW / BARIUM TABLET STUDY
TECHNIQUE: Combined double contrast and single contrast examination performed
using effervescent crystals, thick barium liquid, and thin barium
liquid. The patient was observed with fluoroscopy swallowing a 13 mm
barium sulphate tablet.
FLUOROSCOPY TIME:  Fluoroscopy Time:  1 minutes 54 seconds
Radiation Exposure Index (if provided by the fluoroscopic device):
63.6 mGy
Number of Acquired Spot Images: 27, multiple cine fluoroscopic runs

[Series 1: cp_standard · 0.25mm/px · 2 of 294 frames shown (1 of 7)]
[frame 45/294]
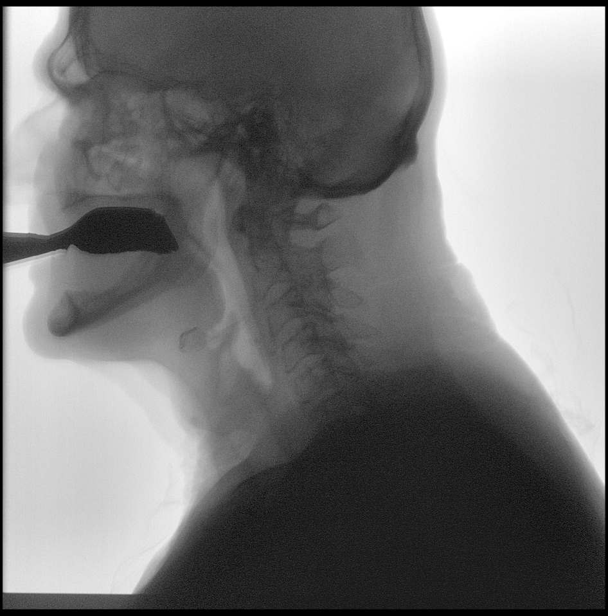
[frame 250/294]
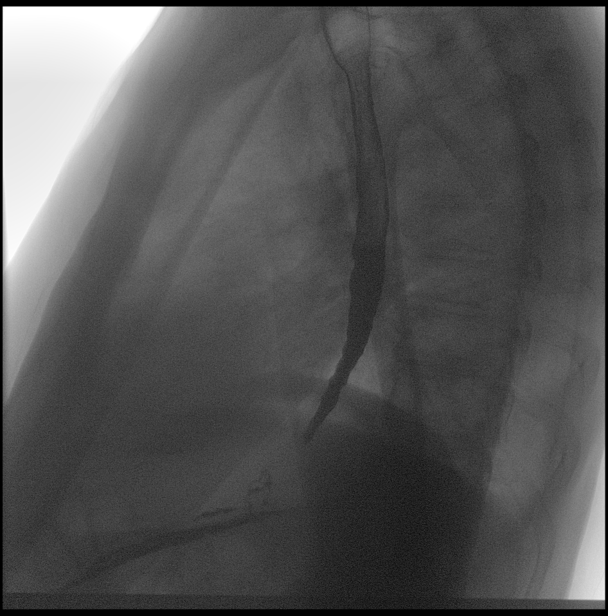

[Series 2: cp_standard · 0.26mm/px · 1 of 174 frames shown (2 of 7)]
[frame 148/174]
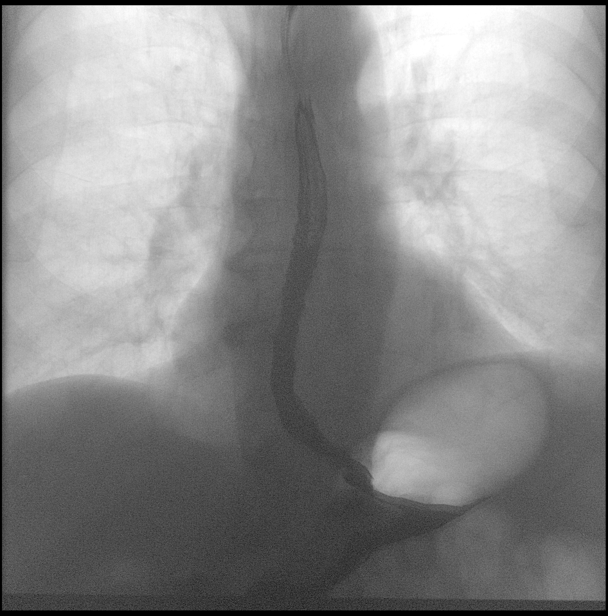

[Series 3: fluoro_barium 2fps_bw · 0.17mm/px · 1 of 6 frames shown (1 of 3)]
[frame 6/6]
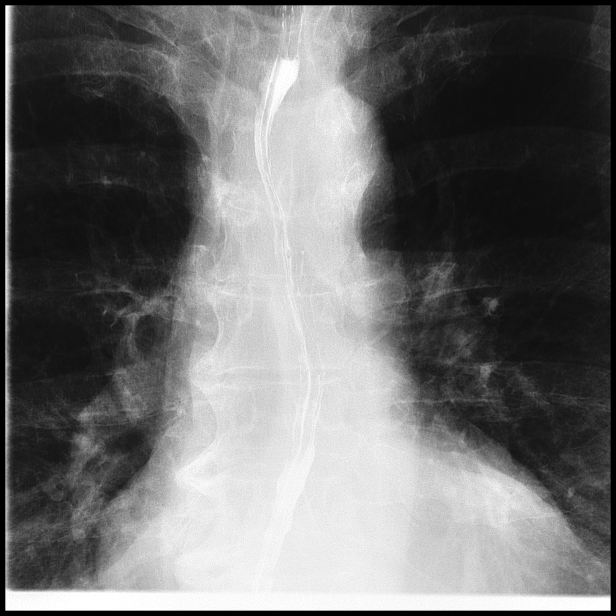

[Series 4: fluoro_barium 2fps_bw · 0.17mm/px · 1 of 4 frames shown (2 of 3)]
[frame 3/4]
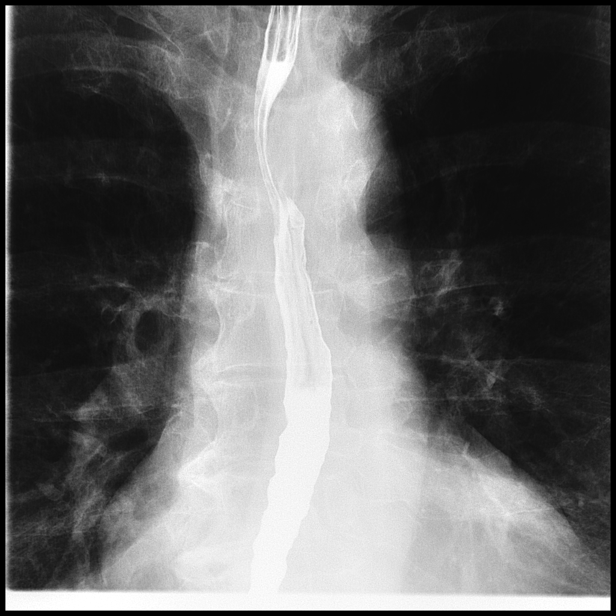

[Series 5: fluoro_barium 2fps_bw · 0.17mm/px · 1 of 8 frames shown (3 of 3)]
[frame 2/8]
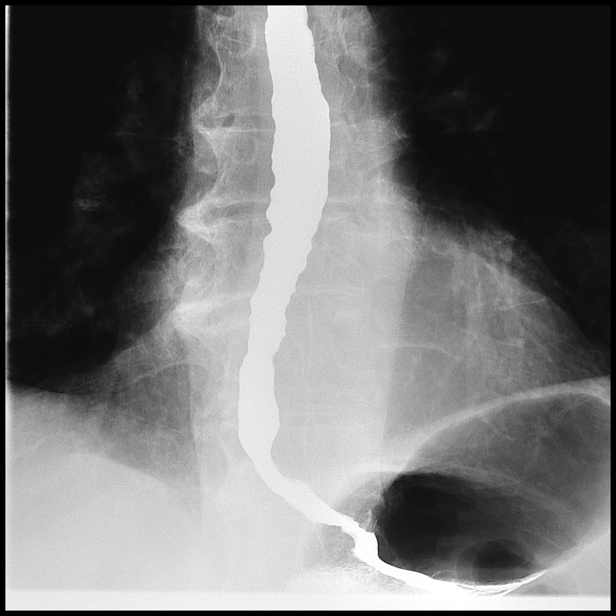

[Series 6: cp_standard · 0.17mm/px · 2 of 109 frames shown (3 of 7)]
[frame 34/109]
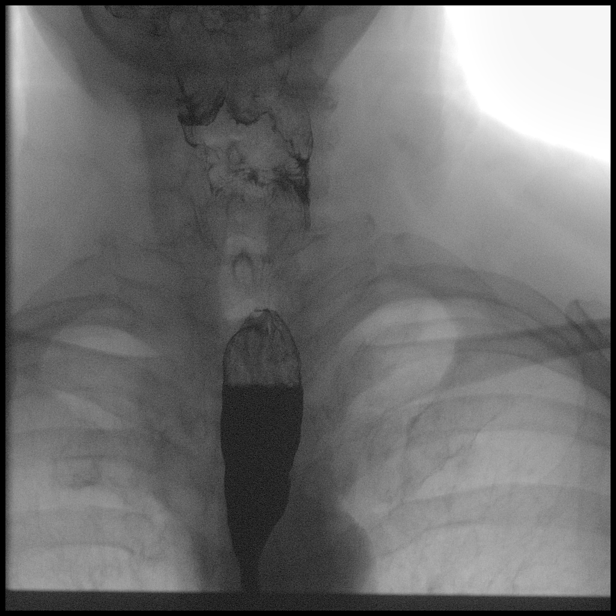
[frame 55/109]
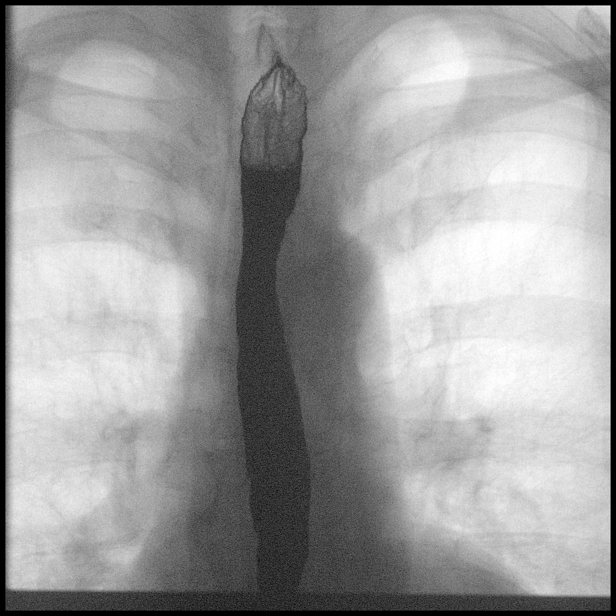

[Series 7: cp_standard · 0.26mm/px · 1 of 51 frames shown (4 of 7)]
[frame 26/51]
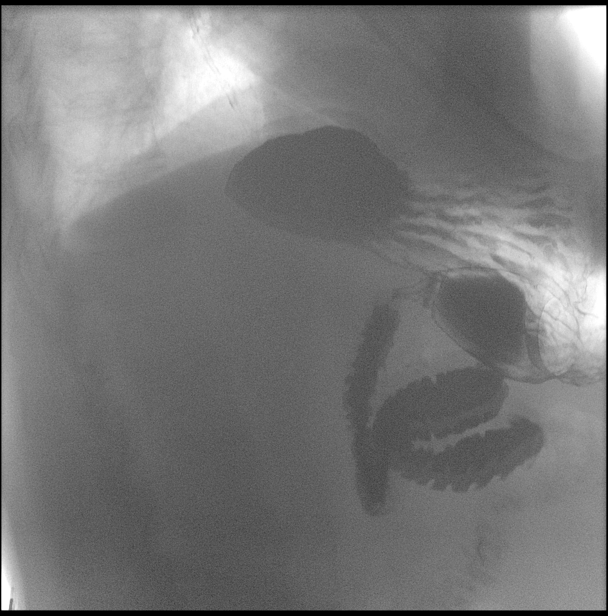

[Series 8: cp_standard · 0.26mm/px · 1 of 69 frames shown (5 of 7)]
[frame 35/69]
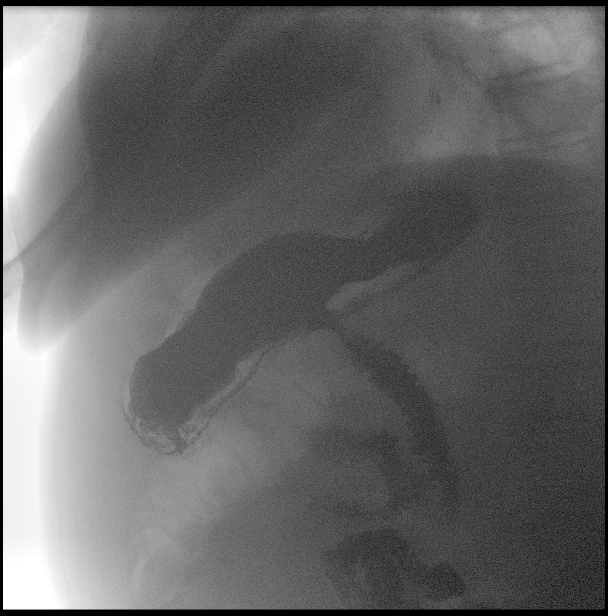

[Series 9: cp_standard · 0.26mm/px · 2 of 170 frames shown (6 of 7)]
[frame 86/170]
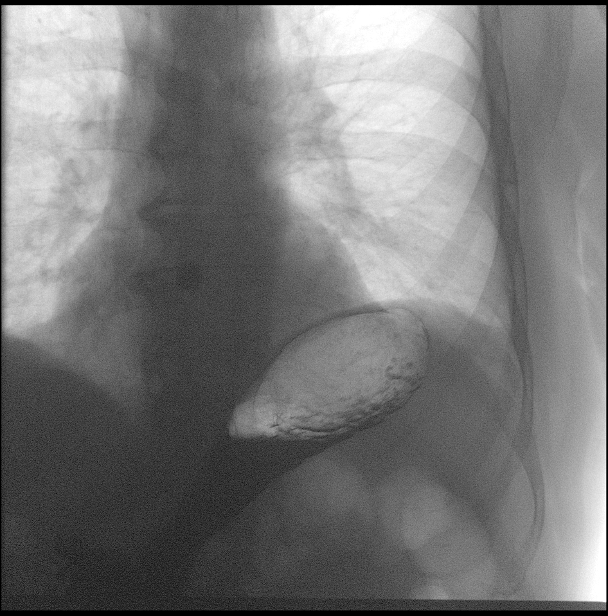
[frame 107/170]
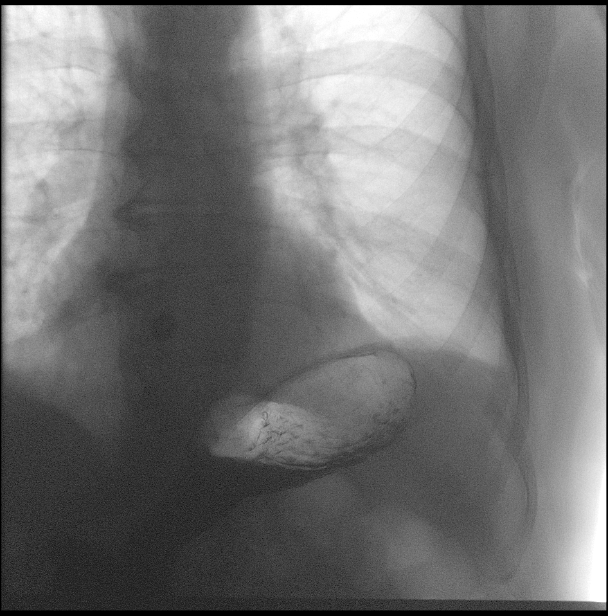

[Series 11: cp_standard · 0.26mm/px · 2 of 7 frames shown (7 of 7)]
[frame 2/7]
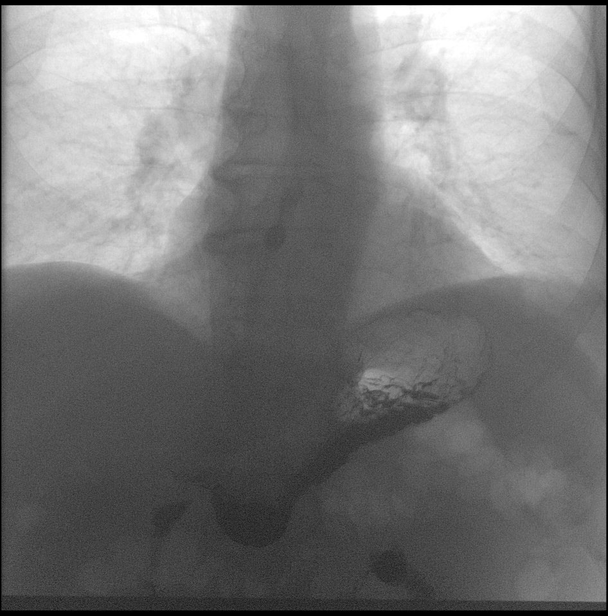
[frame 6/7]
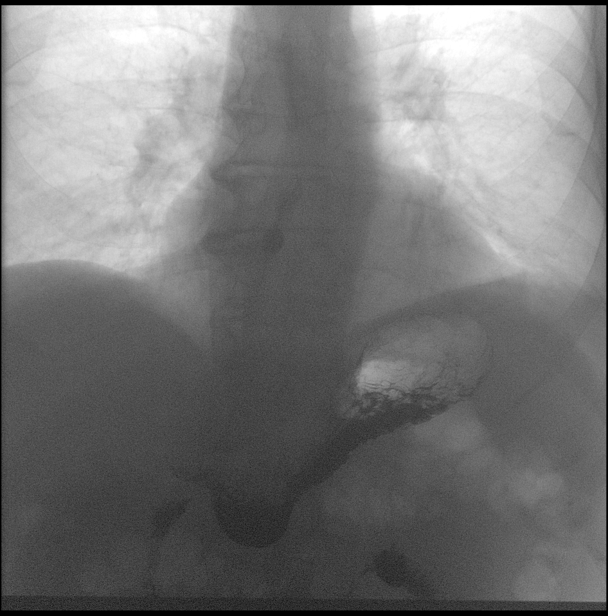

[14 of 24 positions shown; findings below may reference images not displayed]

FINDINGS: The swallowing mechanism and esophageal transit time are within
normal limits. Some intermittent loss of the primary stripping wave
is noted distally with changes of tertiary contractions and overall
general esophageal narrowing distally. No definitive mucosal
abnormality is noted.

No reflux or reflux esophagitis is noted. No hiatal hernia is seen.
Barium impregnated tablet passed to the distal third of the
esophagus and held at that level for greater than 20 minutes prior
to dissolving and passing into the stomach. This mildly reproduced
the patient's symptomatology.
IMPRESSION: Overall distal esophageal narrowing without definitive mass.
Tertiary contractions are noted as well as intermittent loss of the
primary stripping wave.

## 2023-04-21 ENCOUNTER — Ambulatory Visit
Admission: RE | Admit: 2023-04-21 | Discharge: 2023-04-21 | Disposition: A | Discharge status: Home / Self Care | Payer: BC Managed Care – PPO | Source: Ambulatory Visit | Attending: Physician Assistant | Admitting: Physician Assistant

## 2023-04-21 DIAGNOSIS — J9809 Other diseases of bronchus, not elsewhere classified: Secondary | ICD-10-CM | POA: Diagnosis not present

## 2023-04-21 DIAGNOSIS — R911 Solitary pulmonary nodule: Secondary | ICD-10-CM | POA: Insufficient documentation

## 2023-04-21 DIAGNOSIS — J439 Emphysema, unspecified: Secondary | ICD-10-CM | POA: Diagnosis not present

## 2023-04-21 MED ORDER — IOHEXOL 300 MG/ML  SOLN
75.0000 mL | Freq: Once | INTRAMUSCULAR | Status: AC | PRN
  Administered 2023-04-21: 75 mL via INTRAVENOUS

## 2023-04-27 ENCOUNTER — Ambulatory Visit: Payer: BC Managed Care – PPO

## 2023-05-03 DIAGNOSIS — L578 Other skin changes due to chronic exposure to nonionizing radiation: Secondary | ICD-10-CM | POA: Diagnosis not present

## 2023-05-03 DIAGNOSIS — L821 Other seborrheic keratosis: Secondary | ICD-10-CM | POA: Diagnosis not present

## 2023-05-19 ENCOUNTER — Other Ambulatory Visit: Payer: Self-pay | Admitting: Physician Assistant

## 2023-05-19 ENCOUNTER — Ambulatory Visit
Admission: RE | Admit: 2023-05-19 | Discharge: 2023-05-19 | Disposition: A | Discharge status: Home / Self Care | Payer: BC Managed Care – PPO | Source: Ambulatory Visit | Attending: Physician Assistant | Admitting: Physician Assistant

## 2023-05-19 DIAGNOSIS — R059 Cough, unspecified: Secondary | ICD-10-CM

## 2023-06-02 DIAGNOSIS — R911 Solitary pulmonary nodule: Secondary | ICD-10-CM | POA: Diagnosis not present

## 2023-06-02 DIAGNOSIS — R059 Cough, unspecified: Secondary | ICD-10-CM | POA: Diagnosis not present

## 2023-06-02 DIAGNOSIS — J329 Chronic sinusitis, unspecified: Secondary | ICD-10-CM | POA: Diagnosis not present

## 2023-06-02 DIAGNOSIS — K219 Gastro-esophageal reflux disease without esophagitis: Secondary | ICD-10-CM | POA: Diagnosis not present

## 2023-06-15 ENCOUNTER — Encounter: Payer: Self-pay | Admitting: Pulmonary Disease

## 2023-06-15 ENCOUNTER — Ambulatory Visit: Payer: BC Managed Care – PPO | Admitting: Pulmonary Disease

## 2023-06-15 VITALS — BP 118/70 | HR 63 | Ht 75.0 in | Wt 246.0 lb

## 2023-06-15 DIAGNOSIS — R911 Solitary pulmonary nodule: Secondary | ICD-10-CM | POA: Diagnosis not present

## 2023-06-15 DIAGNOSIS — J432 Centrilobular emphysema: Secondary | ICD-10-CM

## 2023-06-15 DIAGNOSIS — J310 Chronic rhinitis: Secondary | ICD-10-CM | POA: Diagnosis not present

## 2023-06-15 MED ORDER — FLUTICASONE PROPIONATE 50 MCG/ACT NA SUSP: 1.0000 | Freq: Every day | NASAL | 2 refills | Status: DC

## 2023-06-15 MED ORDER — FLUTICASONE-SALMETEROL 250-50 MCG/ACT IN AEPB
1.0000 | INHALATION_SPRAY | Freq: Two times a day (BID) | RESPIRATORY_TRACT | 5 refills | Status: DC
Start: 2023-06-15 — End: 2023-11-08

## 2023-06-15 NOTE — Patient Instructions (Addendum)
Start advair diskus 250-33mcg 1 puff twice daily - rinse mouth out after each use  Start fluticasone nasal spray, 1 spray per nostril daily  We will check pulmonary function tests at follow up visit in 4 months

## 2023-06-15 NOTE — Progress Notes (Signed)
Synopsis: Referred in July 2024 for cough  Subjective:   PATIENT ID: Jackson Powers DOB: 10/31/1946, MRN: [id number]   HPI  Chief Complaint  Patient presents with   Consult    Referred by PCP for chronic cough for the past 6 months. Has been on several abx but none have helped. Slight increase in SOB and wheezing at times.    Jackson Powers is a 77 year old Powers, former smoker with history of hypertension who is referred to pulmonary clinic for cough.  He reports cough with intermittent sputum production over recent months.  He reports that initially started along with a sinus infection but despite multiple rounds of antibiotics he has not had improvement.  He denies significant issues of shortness of breath or wheezing.  He does report postnasal drip.  He denies heartburn issues.  He is a former smoker and quit in 1989.  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  Past Medical History:  Diagnosis Date   Acute gastritis without hemorrhage 2007   Arthritis    osteoarthritis    Benign neoplasm of colon 2007   Esophagitis 2007   Hypertension    Orthopnea    reports " i have trouble breathing when i lie completely flat. i have to use 2 people to prop my head up"    Panic anxiety syndrome    patient reports having severe panic anxiety and claustraphobia. states with ear surgery years ago , they had to tie him down which caused him to panic even more;    Right BBB/left ant fasc block    (OLD)     No family history on file.   Social History   Socioeconomic History   Marital status: Married    Spouse name: Not on file   Number of children: Not on file   Years of education: Not on file   Highest education level: Not on file  Occupational History   Not on file  Tobacco Use   Smoking status: Former    Current packs/day: 0.00    Average packs/day: 2.0 packs/day for 15.0 years (30.0 ttl pk-yrs)    Types:  Cigarettes    Start date: 46    Quit date: 65    Years since quitting: 35.6   Smokeless tobacco: Never  Vaping Use   Vaping status: Never Used  Substance and Sexual Activity   Alcohol use: Not Currently   Drug use: Never   Sexual activity: Not on file  Other Topics Concern   Not on file  Social History Narrative   Not on file   Social Determinants of Health   Financial Resource Strain: Not on file  Food Insecurity: Not on file  Transportation Needs: Not on file  Physical Activity: Not on file  Stress: Not on file  Social Connections: Not on file  Intimate Partner Violence: Not on file     No Known Allergies   Outpatient Medications Prior to Visit  Medication Sig Dispense Refill   amLODipine (NORVASC) 10 MG tablet Take 10 mg by mouth daily.     clopidogrel (PLAVIX) 75 MG tablet Take 75 mg by mouth daily.     metoprolol succinate (TOPROL-XL) 25 MG 24 hr tablet Take 25 mg by mouth daily.     atorvastatin (LIPITOR) 40 MG tablet Take 1 tablet (40 mg total) by mouth daily. 30 tablet 0   metoprolol  tartrate (LOPRESSOR) 25 MG tablet Take 25 mg by mouth 2 (two) times daily.     amLODipine (NORVASC) 5 MG tablet Take 1 tablet (5 mg total) by mouth daily. 30 tablet 0   lisinopril (ZESTRIL) 10 MG tablet Take 20 mg by mouth daily.     Facility-Administered Medications Prior to Visit  Medication Dose Route Frequency Provider Last Rate Last Admin   sodium chloride flush (NS) 0.9 % injection 3 mL  3 mL Intravenous Q12H Laurier Nancy, MD       Review of Systems  Constitutional:  Negative for chills, fever, malaise/fatigue and weight loss.  HENT:  Negative for congestion, sinus pain and sore throat.   Eyes: Negative.   Respiratory:  Positive for cough, sputum production and wheezing. Negative for hemoptysis and shortness of breath.   Cardiovascular:  Negative for chest pain, palpitations, orthopnea, claudication and leg swelling.  Gastrointestinal:  Negative for abdominal pain,  heartburn, nausea and vomiting.  Genitourinary: Negative.   Musculoskeletal:  Negative for joint pain and myalgias.  Skin:  Negative for rash.  Neurological:  Negative for weakness.  Endo/Heme/Allergies: Negative.   Psychiatric/Behavioral: Negative.     Objective:   Vitals:   06/15/23 1413  BP: 118/70  Pulse: 63  SpO2: 95%  Weight: 246 lb (111.6 kg)  Height: 6\' 3"  (1.905 m)    Physical Exam Constitutional:      General: He is not in acute distress. HENT:     Head: Normocephalic and atraumatic.  Eyes:     Conjunctiva/sclera: Conjunctivae normal.  Cardiovascular:     Rate and Rhythm: Normal rate and regular rhythm.     Pulses: Normal pulses.     Heart sounds: Normal heart sounds. No murmur heard. Pulmonary:     Effort: Pulmonary effort is normal.     Breath sounds: No wheezing, rhonchi or rales.  Musculoskeletal:     Right lower leg: No edema.     Left lower leg: No edema.  Skin:    General: Skin is warm and dry.  Neurological:     General: No focal deficit present.     Mental Status: He is alert.    CBC    Component Value Date/Time   WBC 8.7 12/17/2020 1729   RBC 4.76 12/17/2020 1729   HGB 14.4 12/17/2020 1729   HCT 42.4 12/17/2020 1729   PLT 216 12/17/2020 1729   MCV 89.1 12/17/2020 1729   MCH 30.3 12/17/2020 1729   MCHC 34.0 12/17/2020 1729   RDW 13.0 12/17/2020 1729   LYMPHSABS 2.6 12/17/2020 1729   MONOABS 0.5 12/17/2020 1729   EOSABS 0.1 12/17/2020 1729   BASOSABS 0.0 12/17/2020 1729      Latest Ref Rng & Units 12/17/2020    5:29 PM 07/21/2018    5:21 AM 07/11/2018    9:03 AM  BMP  Glucose 70 - 99 mg/dL 409  811  914   BUN 8 - 23 mg/dL 21  17  14    Creatinine 0.61 - 1.24 mg/dL 7.82  9.56  2.13   Sodium 135 - 145 mmol/L 138  140  141   Potassium 3.5 - 5.1 mmol/L 4.3  4.0  3.9   Chloride 98 - 111 mmol/L 102  106  107   CO2 22 - 32 mmol/L 26  25  25    Calcium 8.9 - 10.3 mg/dL 9.7  8.7  9.5    Chest imaging: CT Chest 04/21/23 1. 1.1 x 1.0 cm  pulmonary  nodule in the periphery of the right upper lobe. Consider one of the following in 3 months for both low-risk and high-risk individuals: (a) repeat chest CT or (b) follow-up PET-CT. This recommendation follows the consensus statement: Guidelines for Management of Incidental Pulmonary Nodules Detected on CT Images: From the Fleischner Society 2017; Radiology 2017; 284:228-243. 2. Mild diffuse bronchial wall thickening with mild to moderate centrilobular and paraseptal emphysema; imaging findings suggestive of underlying COPD. 3. Aortic atherosclerosis, in addition to left main and three-vessel coronary artery disease. Assessment for potential risk factor modification, dietary therapy or pharmacologic therapy may be warranted, if clinically indicated.  PFT:     No data to display          Labs:  Path:  Echo:  Heart Catheterization:    Assessment & Plan:   Centrilobular emphysema (HCC) - Plan: fluticasone-salmeterol (ADVAIR DISKUS) 250-50 MCG/ACT AEPB, Pulmonary Function Test  Rhinitis, unspecified type - Plan: fluticasone (FLONASE) 50 MCG/ACT nasal spray  Discussion: Jackson Powers is a 77 year old Powers, former smoker with history of hypertension who is referred to pulmonary clinic for cough.  His cough is most likely due to underlying COPD and postnasal drip.  He has mild to moderate centrilobular and paraseptal emphysema on CT chest imaging.  He is to start Advair discus 250-50 mcg 1 puff twice daily for COPD.    Start ipratropium nasal spray 2 sprays twice daily as needed for postnasal drip symptoms.  Plan for CT chest in September to follow up pulmonary nodule  Follow up in 4 months with pulmonary function tests.  Melody Comas, MD [facility] Office: [phone]   Current Outpatient Medications:    amLODipine (NORVASC) 10 MG tablet, Take 10 mg by mouth daily., Disp: , Rfl:    clopidogrel (PLAVIX) 75 MG tablet, Take 75 mg by  mouth daily., Disp: , Rfl:    fluticasone (FLONASE) 50 MCG/ACT nasal spray, Place 1 spray into both nostrils daily., Disp: 16 g, Rfl: 2   fluticasone-salmeterol (ADVAIR DISKUS) 250-50 MCG/ACT AEPB, Inhale 1 puff into the lungs in the morning and at bedtime., Disp: 60 each, Rfl: 5   metoprolol succinate (TOPROL-XL) 25 MG 24 hr tablet, Take 25 mg by mouth daily., Disp: , Rfl:  No current facility-administered medications for this visit.  Facility-Administered Medications Ordered in Other Visits:    sodium chloride flush (NS) 0.9 % injection 3 mL, 3 mL, Intravenous, Q12H, Laurier Nancy, MD

## 2023-08-04 DIAGNOSIS — R911 Solitary pulmonary nodule: Secondary | ICD-10-CM | POA: Diagnosis not present

## 2023-08-04 DIAGNOSIS — K219 Gastro-esophageal reflux disease without esophagitis: Secondary | ICD-10-CM | POA: Diagnosis not present

## 2023-08-04 DIAGNOSIS — R059 Cough, unspecified: Secondary | ICD-10-CM | POA: Diagnosis not present

## 2023-08-04 DIAGNOSIS — R0982 Postnasal drip: Secondary | ICD-10-CM | POA: Diagnosis not present

## 2023-08-04 DIAGNOSIS — J439 Emphysema, unspecified: Secondary | ICD-10-CM | POA: Diagnosis not present

## 2023-08-09 ENCOUNTER — Other Ambulatory Visit: Payer: BC Managed Care – PPO

## 2023-08-09 ENCOUNTER — Ambulatory Visit
Admission: RE | Admit: 2023-08-09 | Discharge: 2023-08-09 | Disposition: A | Discharge status: Home / Self Care | Payer: BC Managed Care – PPO | Source: Ambulatory Visit | Attending: Pulmonary Disease | Admitting: Pulmonary Disease

## 2023-08-09 DIAGNOSIS — K8689 Other specified diseases of pancreas: Secondary | ICD-10-CM | POA: Diagnosis not present

## 2023-08-09 DIAGNOSIS — R911 Solitary pulmonary nodule: Secondary | ICD-10-CM | POA: Insufficient documentation

## 2023-08-09 DIAGNOSIS — R918 Other nonspecific abnormal finding of lung field: Secondary | ICD-10-CM | POA: Diagnosis not present

## 2023-10-05 ENCOUNTER — Ambulatory Visit (INDEPENDENT_AMBULATORY_CARE_PROVIDER_SITE_OTHER): Payer: BC Managed Care – PPO | Admitting: Pulmonary Disease

## 2023-10-05 ENCOUNTER — Encounter: Payer: Self-pay | Admitting: Pulmonary Disease

## 2023-10-05 VITALS — BP 126/76 | HR 64 | Temp 98.2°F | Ht 75.0 in | Wt 242.2 lb

## 2023-10-05 DIAGNOSIS — R9389 Abnormal findings on diagnostic imaging of other specified body structures: Secondary | ICD-10-CM

## 2023-10-05 DIAGNOSIS — R911 Solitary pulmonary nodule: Secondary | ICD-10-CM | POA: Diagnosis not present

## 2023-10-05 DIAGNOSIS — J432 Centrilobular emphysema: Secondary | ICD-10-CM

## 2023-10-05 MED ORDER — TRELEGY ELLIPTA 100-62.5-25 MCG/ACT IN AEPB: 1.0000 | INHALATION_SPRAY | Freq: Every day | RESPIRATORY_TRACT | Status: DC

## 2023-10-05 MED ORDER — TRELEGY ELLIPTA 100-62.5-25 MCG/ACT IN AEPB
1.0000 | INHALATION_SPRAY | Freq: Every day | RESPIRATORY_TRACT | Status: DC
Start: 2023-10-05 — End: 2023-10-05

## 2023-10-05 MED ORDER — TRELEGY ELLIPTA 100-62.5-25 MCG/ACT IN AEPB: 1.0000 | INHALATION_SPRAY | Freq: Every day | RESPIRATORY_TRACT | 0 refills | Status: DC

## 2023-10-05 NOTE — Addendum Note (Signed)
Addended byClyda Greener M on: 10/05/2023 02:16 PM   Modules accepted: Orders

## 2023-10-05 NOTE — Patient Instructions (Signed)
Full PFT performed today. °

## 2023-10-05 NOTE — Progress Notes (Signed)
Full PFT performed today. °

## 2023-10-05 NOTE — Progress Notes (Signed)
Synopsis: Referred in July 2024 for cough  Subjective:   PATIENT ID: Jackson Powers, Jackson Powers  HPI  Chief Complaint  Patient presents with   Follow-up    Review PFT from today.  C/o some cough persistent x 6 months.   Jackson Powers is a 77 year old male, former smoker with history of hypertension who returns to pulmonary clinic for cough.  Initial OV 06/15/23 He reports cough with intermittent sputum production over recent months.  He reports that initially started along with a sinus infection but despite multiple rounds of antibiotics he has not had improvement.  He denies significant issues of shortness of breath or wheezing.  He does report postnasal drip.  He denies heartburn issues.  He is a former smoker and quit in 1989.  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  Today OV 10/05/23 The patient, with a history of mild COPD and emphysema, presents with a persistent cough and phlegm production. The cough is occasionally severe enough to wake him at night, and he reports feeling "half sick" in the mornings, possibly due to swallowing mucus overnight. The cough and phlegm production occur throughout the day, but are not associated with shortness of breath or hoarseness.  The patient has been using an inhaler and a nasal spray as prescribed, but reports no improvement in symptoms with the inhaler. The nasal spray has helped clear up some nasal drainage, but has not improved the cough. The patient also reports experiencing heartburn.  Past Medical History:  Diagnosis Date   Acute gastritis without hemorrhage 2007   Arthritis    osteoarthritis    Benign neoplasm of colon 2007   Esophagitis 2007   Hypertension    Orthopnea    reports " i have trouble breathing when i lie completely flat. i have to use 2 people to prop my head up"    Panic anxiety syndrome    patient reports having severe  panic anxiety and claustraphobia. states with ear surgery years ago , they had to tie him down which caused him to panic even more;    Right BBB/left ant fasc block    (OLD)     History reviewed. No pertinent family history.   Social History   Socioeconomic History   Marital status: Married    Spouse name: Not on file   Number of children: Not on file   Years of education: Not on file   Highest education level: Not on file  Occupational History   Not on file  Tobacco Use   Smoking status: Former    Current packs/day: 0.00    Average packs/day: 2.0 packs/day for 15.0 years (30.0 ttl pk-yrs)    Types: Cigarettes    Start date: 35    Quit date: 54    Years since quitting: 35.9   Smokeless tobacco: Never  Vaping Use   Vaping status: Never Used  Substance and Sexual Activity   Alcohol use: Not Currently   Drug use: Never   Sexual activity: Not on file  Other Topics Concern   Not on file  Social History Narrative   Not on file   Social Determinants of Health   Financial Resource Strain: Not on file  Food Insecurity: Not on file  Transportation Needs: Not on file  Physical Activity: Not on file  Stress: Not on file  Social Connections: Not on  file  Intimate Partner Violence: Not on file     No Known Allergies   Outpatient Medications Prior to Visit  Medication Sig Dispense Refill   amLODipine (NORVASC) 10 MG tablet Take 10 mg by mouth daily.     clopidogrel (PLAVIX) 75 MG tablet Take 75 mg by mouth daily.     fluticasone (FLONASE) 50 MCG/ACT nasal spray Place 1 spray into both nostrils daily. 16 g 2   fluticasone-salmeterol (ADVAIR DISKUS) 250-50 MCG/ACT AEPB Inhale 1 puff into the lungs in the morning and at bedtime. 60 each 5   metoprolol succinate (TOPROL-XL) 25 MG 24 hr tablet Take 25 mg by mouth daily.     Facility-Administered Medications Prior to Visit  Medication Dose Route Frequency Provider Last Rate Last Admin   sodium chloride flush (NS) 0.9 %  injection 3 mL  3 mL Intravenous Q12H Laurier Nancy, MD       Review of Systems  Constitutional:  Negative for chills, fever, malaise/fatigue and weight loss.  HENT:  Negative for congestion, sinus pain and sore throat.   Eyes: Negative.   Respiratory:  Positive for cough and sputum production. Negative for hemoptysis, shortness of breath and wheezing.   Cardiovascular:  Negative for chest pain, palpitations, orthopnea, claudication and leg swelling.  Gastrointestinal:  Negative for abdominal pain, heartburn, nausea and vomiting.  Genitourinary: Negative.   Musculoskeletal:  Negative for joint pain and myalgias.  Skin:  Negative for rash.  Neurological:  Negative for weakness.  Endo/Heme/Allergies: Negative.   Psychiatric/Behavioral: Negative.     Objective:   Vitals:   10/05/23 1300  BP: 126/76  Pulse: 64  Temp: 98.2 F (36.8 C)  TempSrc: Oral  SpO2: 96%  Weight: 242 lb 3.2 oz (109.9 kg)  Height: 6\' 3"  (1.905 m)    Physical Exam Constitutional:      General: He is not in acute distress. HENT:     Head: Normocephalic and atraumatic.  Eyes:     Conjunctiva/sclera: Conjunctivae normal.  Cardiovascular:     Rate and Rhythm: Normal rate and regular rhythm.     Pulses: Normal pulses.     Heart sounds: Normal heart sounds. No murmur heard. Pulmonary:     Effort: Pulmonary effort is normal.     Breath sounds: No wheezing, rhonchi or rales.  Musculoskeletal:     Right lower leg: No edema.     Left lower leg: No edema.  Skin:    General: Skin is warm and dry.  Neurological:     General: No focal deficit present.     Mental Status: He is alert.    CBC    Component Value Date/Time   WBC 8.7 12/17/2020 1729   RBC 4.76 12/17/2020 1729   HGB 14.4 12/17/2020 1729   HCT 42.4 12/17/2020 1729   PLT 216 12/17/2020 1729   MCV 89.1 12/17/2020 1729   MCH 30.3 12/17/2020 1729   MCHC 34.0 12/17/2020 1729   RDW 13.0 12/17/2020 1729   LYMPHSABS 2.6 12/17/2020 1729   MONOABS  0.5 12/17/2020 1729   EOSABS 0.1 12/17/2020 1729   BASOSABS 0.0 12/17/2020 1729      Latest Ref Rng & Units 12/17/2020    5:29 PM 07/21/2018    5:21 AM 07/11/2018    9:03 AM  BMP  Glucose 70 - 99 mg/dL 536  644  034   BUN 8 - 23 mg/dL 21  17  14    Creatinine 0.61 - 1.24 mg/dL 7.42  0.81  0.85   Sodium 135 - 145 mmol/L 138  140  141   Potassium 3.5 - 5.1 mmol/L 4.3  4.0  3.9   Chloride 98 - 111 mmol/L 102  106  107   CO2 22 - 32 mmol/L 26  25  25    Calcium 8.9 - 10.3 mg/dL 9.7  8.7  9.5    Chest imaging: CT Chest 08/09/23 Stable 11 mm juxtapleural right upper lobe lung nodule.   However the airspace opacity which is nodular perihilar on the left is persistent. With appearance of chronic airspace disease would recommend additional workup. PET-CT scan is recommended as the next step in the workup when clinically appropriate   Findings will be called to the ordering service by the radiology physician assistant team   Aortic Atherosclerosis (ICD10-I70.0) and Emphysema (ICD10-J43.9).  CT Chest 04/21/23 1. 1.1 x 1.0 cm pulmonary nodule in the periphery of the right upper lobe. Consider one of the following in 3 months for both low-risk and high-risk individuals: (a) repeat chest CT or (b) follow-up PET-CT. This recommendation follows the consensus statement: Guidelines for Management of Incidental Pulmonary Nodules Detected on CT Images: From the Fleischner Society 2017; Radiology 2017; 284:228-243. 2. Mild diffuse bronchial wall thickening with mild to moderate centrilobular and paraseptal emphysema; imaging findings suggestive of underlying COPD. 3. Aortic atherosclerosis, in addition to left main and three-vessel coronary artery disease. Assessment for potential risk factor modification, dietary therapy or pharmacologic therapy may be warranted, if clinically indicated.  PFT:     No data to display          Labs:  Path:  Echo:  Heart Catheterization:    Assessment  & Plan:   Centrilobular emphysema (HCC)  Lung nodule - Plan: NM PET SUPER D CT  Abnormal CT of the chest - Plan: NM PET SUPER D CT  Discussion: Jackson Powers is a 77 year old male, former smoker with history of hypertension who returns to pulmonary clinic for cough.  PFTs show mild obstruction.   Chronic Cough and Phlegm Production Likely secondary to mild COPD. No improvement with Advair inhaler or nasal spray. Possible silent reflux contributing to symptoms. -Trial of Trelegy Ellipta inhaler, one puff daily. -Consider trial of proton pump inhibitor for 2-3 months if no improvement with inhaler change.  Pulmonary Nodule Stable nodule in right upper lung. New soft tissue thickening in left lung airway, possibly contributing to cough and phlegm production. -Order PET CT scan to further evaluate left lung airway thickening. -Discuss potential bronchoscopy and biopsy if PET scan shows concerning findings.  Follow-up in 3 months or sooner if PET scan results are available.  Jackson Comas, MD Jackson Powers Office: [phone]   Current Outpatient Medications:    amLODipine (NORVASC) 10 MG tablet, Take 10 mg by mouth daily., Disp: , Rfl:    clopidogrel (PLAVIX) 75 MG tablet, Take 75 mg by mouth daily., Disp: , Rfl:    fluticasone (FLONASE) 50 MCG/ACT nasal spray, Place 1 spray into both nostrils daily., Disp: 16 g, Rfl: 2   fluticasone-salmeterol (ADVAIR DISKUS) 250-50 MCG/ACT AEPB, Inhale 1 puff into the lungs in the morning and at bedtime., Disp: 60 each, Rfl: 5   metoprolol succinate (TOPROL-XL) 25 MG 24 hr tablet, Take 25 mg by mouth daily., Disp: , Rfl:  No current facility-administered medications for this visit.  Facility-Administered Medications Ordered in Other Visits:    sodium chloride flush (NS) 0.9 % injection 3 mL, 3 mL, Intravenous,  Altamese Dilling, MD

## 2023-10-05 NOTE — Addendum Note (Signed)
Addended byClyda Greener M on: 10/05/2023 02:11 PM   Modules accepted: Orders

## 2023-10-05 NOTE — Patient Instructions (Signed)
Try trelegy ellipta 1 puff daily - rinse mouth out after each use  Stop using advair inhaler while using trelegy  If you do not notice any improvement in your breathing or cough, then ok to stop inhalers all together  We can trial an antacid medicine in the future if your cough does not improve.  Follow up in 3 months, call sooner if needed

## 2023-10-07 ENCOUNTER — Telehealth: Payer: Self-pay | Admitting: Pulmonary Disease

## 2023-10-07 DIAGNOSIS — R911 Solitary pulmonary nodule: Secondary | ICD-10-CM

## 2023-10-07 NOTE — Telephone Encounter (Signed)
   I sent message to Dr Francine Graven for clarification on order. Dr Francine Graven stated that it is fine for patient to have NM PET Scan since NM PET Super D that was ordered is a combo case order and patient is not in need of that. Can someone please place order for regular NM PET Scan so I can get patient on schedule?  I copy pasted my message thread from yesterday with Dr Francine Graven above and I closed the original order.

## 2023-10-12 NOTE — Telephone Encounter (Signed)
Order placed

## 2023-10-13 NOTE — Telephone Encounter (Signed)
Patient scheduled and notified.

## 2023-10-20 ENCOUNTER — Encounter
Admission: RE | Admit: 2023-10-20 | Discharge: 2023-10-20 | Disposition: A | Discharge status: Home / Self Care | Payer: BC Managed Care – PPO | Source: Ambulatory Visit | Attending: Pulmonary Disease | Admitting: Pulmonary Disease

## 2023-10-20 DIAGNOSIS — R911 Solitary pulmonary nodule: Secondary | ICD-10-CM | POA: Insufficient documentation

## 2023-10-20 DIAGNOSIS — R918 Other nonspecific abnormal finding of lung field: Secondary | ICD-10-CM | POA: Diagnosis not present

## 2023-10-20 LAB — GLUCOSE, CAPILLARY: Glucose-Capillary: 113 mg/dL — ABNORMAL HIGH (ref 70–99)

## 2023-10-20 MED ORDER — FLUDEOXYGLUCOSE F - 18 (FDG) INJECTION
12.0800 | Freq: Once | INTRAVENOUS | Status: AC | PRN
  Administered 2023-10-20: 12.08 via INTRAVENOUS

## 2023-10-25 ENCOUNTER — Ambulatory Visit: Payer: BC Managed Care – PPO

## 2023-10-28 ENCOUNTER — Other Ambulatory Visit (HOSPITAL_COMMUNITY): Payer: BC Managed Care – PPO

## 2023-11-02 DIAGNOSIS — L570 Actinic keratosis: Secondary | ICD-10-CM | POA: Diagnosis not present

## 2023-11-03 ENCOUNTER — Other Ambulatory Visit: Payer: Self-pay | Admitting: Pulmonary Disease

## 2023-11-05 NOTE — Telephone Encounter (Signed)
The lov stated he was on a trial with the Trelegy. Pt is requesting a refill. Please advise.

## 2023-11-08 LAB — PULMONARY FUNCTION TEST
DL/VA % pred: 92 %
DL/VA: 3.55 ml/min/mmHg/L
DLCO cor % pred: 89 %
DLCO cor: 24.92 ml/min/mmHg
DLCO unc % pred: 89 %
DLCO unc: 24.92 ml/min/mmHg
FEF 25-75 Post: 1.71 L/s
FEF 25-75 Pre: 1.77 L/s
FEF2575-%Change-Post: -3 %
FEF2575-%Pred-Post: 69 %
FEF2575-%Pred-Pre: 72 %
FEV1-%Change-Post: 0 %
FEV1-%Pred-Post: 83 %
FEV1-%Pred-Pre: 83 %
FEV1-Post: 2.9 L
FEV1-Pre: 2.9 L
FEV1FVC-%Change-Post: -2 %
FEV1FVC-%Pred-Pre: 94 %
FEV6-%Change-Post: 0 %
FEV6-%Pred-Post: 94 %
FEV6-%Pred-Pre: 93 %
FEV6-Post: 4.26 L
FEV6-Pre: 4.24 L
FEV6FVC-%Change-Post: -1 %
FEV6FVC-%Pred-Post: 104 %
FEV6FVC-%Pred-Pre: 105 %
FVC-%Change-Post: 1 %
FVC-%Pred-Post: 90 %
FVC-%Pred-Pre: 88 %
FVC-Post: 4.34 L
FVC-Pre: 4.25 L
Post FEV1/FVC ratio: 67 %
Post FEV6/FVC ratio: 98 %
Pre FEV1/FVC ratio: 68 %
Pre FEV6/FVC Ratio: 100 %
RV % pred: 121 %
RV: 3.43 L
TLC % pred: 101 %
TLC: 8 L

## 2023-11-09 DIAGNOSIS — J439 Emphysema, unspecified: Secondary | ICD-10-CM | POA: Diagnosis not present

## 2023-11-09 DIAGNOSIS — I10 Essential (primary) hypertension: Secondary | ICD-10-CM | POA: Diagnosis not present

## 2023-11-09 DIAGNOSIS — Z9181 History of falling: Secondary | ICD-10-CM | POA: Diagnosis not present

## 2023-11-09 DIAGNOSIS — I679 Cerebrovascular disease, unspecified: Secondary | ICD-10-CM | POA: Diagnosis not present

## 2023-11-09 DIAGNOSIS — I2510 Atherosclerotic heart disease of native coronary artery without angina pectoris: Secondary | ICD-10-CM | POA: Diagnosis not present

## 2023-11-09 DIAGNOSIS — R911 Solitary pulmonary nodule: Secondary | ICD-10-CM | POA: Diagnosis not present

## 2023-11-09 DIAGNOSIS — Z1331 Encounter for screening for depression: Secondary | ICD-10-CM | POA: Diagnosis not present

## 2023-11-09 DIAGNOSIS — Z139 Encounter for screening, unspecified: Secondary | ICD-10-CM | POA: Diagnosis not present

## 2023-11-09 DIAGNOSIS — R7303 Prediabetes: Secondary | ICD-10-CM | POA: Diagnosis not present

## 2023-11-09 DIAGNOSIS — J309 Allergic rhinitis, unspecified: Secondary | ICD-10-CM | POA: Diagnosis not present

## 2023-11-19 ENCOUNTER — Telehealth: Payer: Self-pay | Admitting: Pulmonary Disease

## 2023-11-19 NOTE — Telephone Encounter (Signed)
 Hi Kathleene Hazel and [place],  Patient's CT PET scan from 12/4 shows uptake in a left infrahilar nodular airspace opacity with SUV of 13.2. Is this space amenable for nav bronch biopsy? If so I can reach out to patient regarding procedure.  Thanks, Cletis Athens

## 2023-11-22 NOTE — Telephone Encounter (Signed)
 Reviewed films - I agree that you should be able to get to the proximal component of this lesion, may see an endobronchial lesion but would be prepared to navigate / TBBx in event lesion is too distal.

## 2023-11-23 ENCOUNTER — Telehealth: Payer: Self-pay | Admitting: Student in an Organized Health Care Education/Training Program

## 2023-11-23 ENCOUNTER — Encounter: Payer: Self-pay | Admitting: Student in an Organized Health Care Education/Training Program

## 2023-11-23 DIAGNOSIS — R911 Solitary pulmonary nodule: Secondary | ICD-10-CM | POA: Insufficient documentation

## 2023-11-23 NOTE — Telephone Encounter (Signed)
 thanks

## 2023-11-23 NOTE — Telephone Encounter (Signed)
 Patient is a 78 year old male noted to have a new soft tissue thickening in the left lung airway, concerning for malignancy. This was noted to be FDG avid on PET, with associated hilar and mediastinal lymphadenopathy. Right lung nodule noted to be stable. He is at high risk for the development of pulmonary malignancy and will benefit from flexible bronchoscopy (robotic assisted, EBUS) to better evaluate the airway and obtain tissue for diagnosis. I called the patient and explained these findings and he is agreeable to have the procedure scheduled. Will need plavix  held for 5 days prior to procedure. Primary pulmonologist Dr. Kara aware and in agreement.  Belva November, MD [facility] 11/23/2023 2:05 PM

## 2023-11-23 NOTE — Telephone Encounter (Signed)
 This was scheduled by someone

## 2023-11-30 ENCOUNTER — Ambulatory Visit
Admission: RE | Admit: 2023-11-30 | Discharge: 2023-11-30 | Disposition: A | Discharge status: Home / Self Care | Payer: BC Managed Care – PPO | Source: Ambulatory Visit | Attending: Student in an Organized Health Care Education/Training Program | Admitting: Student in an Organized Health Care Education/Training Program

## 2023-11-30 DIAGNOSIS — R911 Solitary pulmonary nodule: Secondary | ICD-10-CM | POA: Diagnosis not present

## 2023-12-01 ENCOUNTER — Other Ambulatory Visit: Payer: Self-pay

## 2023-12-01 ENCOUNTER — Encounter (HOSPITAL_COMMUNITY): Payer: Self-pay | Admitting: Student in an Organized Health Care Education/Training Program

## 2023-12-01 NOTE — Pre-Procedure Instructions (Signed)
-------------    SDW INSTRUCTIONS given:  Your procedure is scheduled on 1/16.  Report to [facility] Main Entrance "A" at 10:15 A.M., and check in at the Admitting office.  Any questions or running late day of surgery: call [phone]    Remember:  Do not eat or drink after midnight the night before your surgery    Take these medicines the morning of surgery with A SIP OF WATER  Amlodipine  fluticasone  (FLONASE )  Fluticasone -Umeclidin-Vilant (TRELEGY ELLIPTA )  metoprolol succinate (TOPROL-XL)   Stop taking Plavix  5 days prior to surgery. Last dose 1/10.   As of today, STOP taking any Aspirin  (unless otherwise instructed by your surgeon) Aleve, Naproxen, Ibuprofen, Motrin, Advil, Goody's, BC's, all herbal medications, fish oil, and all vitamins.   Do NOT Smoke (Tobacco/Vaping) 24 hours prior to your procedure  If you use a CPAP at night, you may bring all equipment for your overnight stay.     You will be asked to remove any contacts, glasses, piercing's, hearing aid's, dentures/partials prior to surgery. Please bring cases for these items if needed.     Patients discharged the day of surgery will not be allowed to drive home, and someone needs to stay with them for 24 hours.  SURGICAL WAITING ROOM VISITATION Patients may have no more than 2 support people in the waiting area - these visitors may rotate.   Pre-op nurse will coordinate an appropriate time for 1 ADULT support person, who may not rotate, to accompany patient in pre-op.  Children under the age of 24 must have an adult with them who is not the patient and must remain in the main waiting area with an adult.  If the patient needs to stay at the hospital during part of their recovery, the visitor guidelines for inpatient rooms apply.  Please refer to the [facility] website for the visitor guidelines for any additional information.   Special instructions:   [facility]- Preparing For Surgery   Please follow these  instructions carefully.   Shower the NIGHT BEFORE SURGERY and the MORNING OF SURGERY with DIAL Soap.   Pat yourself dry with a CLEAN TOWEL.  Wear CLEAN PAJAMAS to bed the night before surgery  Place CLEAN SHEETS on your bed the night of your first shower and DO NOT SLEEP WITH PETS.   Additional instructions for the day of surgery: DO NOT APPLY any lotions, deodorants, cologne, or perfumes.   Do not wear jewelry or makeup Do not wear nail polish, gel polish, artificial nails, or any other type of covering on natural nails (fingers and toes) Do not bring valuables to the hospital. [facility] is not responsible for valuables/personal belongings. Put on clean/comfortable clothes.  Please brush your teeth.  Ask your nurse before applying any prescription medications to the skin.    Questions were answered. Patient verbalized understanding of instructions.

## 2023-12-01 NOTE — Progress Notes (Signed)
 PCP - [name], PA-C Cardiologist - denies Pulmonologist- Dr. Duaine German  PPM/ICD - denies   Chest x-ray - denies EKG - 12/17/20- needs DOS Stress Test - 2016 ECHO - 12/18/20 Cardiac Cath - 10/01/20  CPAP - denies  DM- denies  Blood Thinner Instructions: Hold Plavix  5 days. Pt took last dose 1/10. Aspirin  Instructions: n/a  ERAS Protcol - no, NPO  COVID TEST- n/a  Anesthesia review: no  Patient verbally denies any shortness of breath, fever, cough and chest pain during phone call

## 2023-12-02 ENCOUNTER — Encounter (HOSPITAL_COMMUNITY): Payer: Self-pay | Admitting: Student in an Organized Health Care Education/Training Program

## 2023-12-02 ENCOUNTER — Ambulatory Visit (HOSPITAL_COMMUNITY)
Admission: RE | Admit: 2023-12-02 | Discharge: 2023-12-02 | Disposition: A | Discharge status: Home / Self Care | Payer: BC Managed Care – PPO | Attending: Student in an Organized Health Care Education/Training Program | Admitting: Student in an Organized Health Care Education/Training Program

## 2023-12-02 ENCOUNTER — Ambulatory Visit (HOSPITAL_COMMUNITY): Payer: BC Managed Care – PPO

## 2023-12-02 ENCOUNTER — Encounter (HOSPITAL_COMMUNITY)
Admission: RE | Disposition: A | Discharge status: Home / Self Care | Payer: Self-pay | Source: Home / Self Care | Attending: Student in an Organized Health Care Education/Training Program

## 2023-12-02 DIAGNOSIS — Z8673 Personal history of transient ischemic attack (TIA), and cerebral infarction without residual deficits: Secondary | ICD-10-CM | POA: Diagnosis not present

## 2023-12-02 DIAGNOSIS — Z79899 Other long term (current) drug therapy: Secondary | ICD-10-CM | POA: Insufficient documentation

## 2023-12-02 DIAGNOSIS — C3412 Malignant neoplasm of upper lobe, left bronchus or lung: Secondary | ICD-10-CM | POA: Diagnosis not present

## 2023-12-02 DIAGNOSIS — R911 Solitary pulmonary nodule: Secondary | ICD-10-CM | POA: Insufficient documentation

## 2023-12-02 DIAGNOSIS — C771 Secondary and unspecified malignant neoplasm of intrathoracic lymph nodes: Secondary | ICD-10-CM | POA: Insufficient documentation

## 2023-12-02 DIAGNOSIS — Z87891 Personal history of nicotine dependence: Secondary | ICD-10-CM | POA: Diagnosis not present

## 2023-12-02 DIAGNOSIS — I10 Essential (primary) hypertension: Secondary | ICD-10-CM | POA: Insufficient documentation

## 2023-12-02 DIAGNOSIS — Z7902 Long term (current) use of antithrombotics/antiplatelets: Secondary | ICD-10-CM | POA: Diagnosis not present

## 2023-12-02 HISTORY — PX: BRONCHIAL NEEDLE ASPIRATION BIOPSY: SHX5106

## 2023-12-02 HISTORY — DX: Transient cerebral ischemic attack, unspecified: G45.9

## 2023-12-02 HISTORY — PX: BRONCHIAL BRUSHINGS: SHX5108

## 2023-12-02 HISTORY — PX: VIDEO BRONCHOSCOPY WITH ENDOBRONCHIAL ULTRASOUND: SHX6177

## 2023-12-02 HISTORY — DX: Chronic obstructive pulmonary disease, unspecified: J44.9

## 2023-12-02 HISTORY — PX: BRONCHIAL WASHINGS: SHX5105

## 2023-12-02 HISTORY — PX: BRONCHIAL BIOPSY: SHX5109

## 2023-12-02 LAB — CBC
HCT: 46.5 % (ref 39.0–52.0)
Hemoglobin: 15.5 g/dL (ref 13.0–17.0)
MCH: 29.9 pg (ref 26.0–34.0)
MCHC: 33.3 g/dL (ref 30.0–36.0)
MCV: 89.6 fL (ref 80.0–100.0)
Platelets: 257 10*3/uL (ref 150–400)
RBC: 5.19 MIL/uL (ref 4.22–5.81)
RDW: 13 % (ref 11.5–15.5)
WBC: 7.9 10*3/uL (ref 4.0–10.5)
nRBC: 0 % (ref 0.0–0.2)

## 2023-12-02 LAB — BASIC METABOLIC PANEL
Anion gap: 10 (ref 5–15)
BUN: 18 mg/dL (ref 8–23)
CO2: 19 mmol/L — ABNORMAL LOW (ref 22–32)
Calcium: 9 mg/dL (ref 8.9–10.3)
Chloride: 108 mmol/L (ref 98–111)
Creatinine, Ser: 0.85 mg/dL (ref 0.61–1.24)
GFR, Estimated: >60 mL/min (ref >60)
Glucose, Bld: 101 mg/dL — ABNORMAL HIGH (ref 70–99)
Potassium: 4 mmol/L (ref 3.5–5.1)
Sodium: 137 mmol/L (ref 135–145)

## 2023-12-02 SURGERY — BRONCHOSCOPY, WITH BIOPSY USING ELECTROMAGNETIC NAVIGATION
Anesthesia: General | Laterality: Bilateral

## 2023-12-02 MED ORDER — LACTATED RINGERS IV SOLN: INTRAVENOUS | Status: DC

## 2023-12-02 MED ORDER — FENTANYL CITRATE (PF) 100 MCG/2ML IJ SOLN: 25.0000 ug | INTRAMUSCULAR | Status: DC | PRN

## 2023-12-02 MED ORDER — EPHEDRINE SULFATE-NACL 50-0.9 MG/10ML-% IV SOSY
PREFILLED_SYRINGE | INTRAVENOUS | Status: DC | PRN
  Administered 2023-12-02 (×2): 10 mg via INTRAVENOUS

## 2023-12-02 MED ORDER — CHLORHEXIDINE GLUCONATE 0.12 % MT SOLN: 15.0000 mL | Freq: Once | OROMUCOSAL | Status: AC

## 2023-12-02 MED ORDER — CHLORHEXIDINE GLUCONATE 0.12 % MT SOLN
OROMUCOSAL | Status: AC
  Administered 2023-12-02: 15 mL via OROMUCOSAL
  Filled 2023-12-02: qty 15

## 2023-12-02 MED ORDER — PHENYLEPHRINE 80 MCG/ML (10ML) SYRINGE FOR IV PUSH (FOR BLOOD PRESSURE SUPPORT)
PREFILLED_SYRINGE | INTRAVENOUS | Status: DC | PRN
  Administered 2023-12-02: 160 ug via INTRAVENOUS
  Administered 2023-12-02: 80 ug via INTRAVENOUS
  Administered 2023-12-02: 160 ug via INTRAVENOUS

## 2023-12-02 MED ORDER — DEXAMETHASONE SODIUM PHOSPHATE 10 MG/ML IJ SOLN
INTRAMUSCULAR | Status: DC | PRN
  Administered 2023-12-02: 4 mg via INTRAVENOUS

## 2023-12-02 MED ORDER — LIDOCAINE 2% (20 MG/ML) 5 ML SYRINGE
INTRAMUSCULAR | Status: DC | PRN
  Administered 2023-12-02: 60 mg via INTRAVENOUS

## 2023-12-02 MED ORDER — PROPOFOL 10 MG/ML IV BOLUS
INTRAVENOUS | Status: DC | PRN
  Administered 2023-12-02: 130 mg via INTRAVENOUS

## 2023-12-02 MED ORDER — SUGAMMADEX SODIUM 200 MG/2ML IV SOLN
INTRAVENOUS | Status: DC | PRN
  Administered 2023-12-02: 200 mg via INTRAVENOUS

## 2023-12-02 MED ORDER — DROPERIDOL 2.5 MG/ML IJ SOLN: 0.6250 mg | Freq: Once | INTRAMUSCULAR | Status: DC | PRN

## 2023-12-02 MED ORDER — ONDANSETRON HCL 4 MG/2ML IJ SOLN: INTRAMUSCULAR | Status: DC | PRN

## 2023-12-02 MED ORDER — OXYCODONE HCL 5 MG/5ML PO SOLN: 5.0000 mg | Freq: Once | ORAL | Status: DC | PRN

## 2023-12-02 MED ORDER — OXYCODONE HCL 5 MG PO TABS: 5.0000 mg | ORAL_TABLET | Freq: Once | ORAL | Status: DC | PRN

## 2023-12-02 MED ORDER — ACETAMINOPHEN 10 MG/ML IV SOLN: 1000.0000 mg | Freq: Once | INTRAVENOUS | Status: DC | PRN

## 2023-12-02 MED ORDER — ROCURONIUM BROMIDE 10 MG/ML (PF) SYRINGE
PREFILLED_SYRINGE | INTRAVENOUS | Status: DC | PRN
  Administered 2023-12-02: 80 mg via INTRAVENOUS
  Administered 2023-12-02: 20 mg via INTRAVENOUS

## 2023-12-02 NOTE — H&P (Signed)
Assessment & Plan:   #Lung Nodule  Presenting with a LUL nodule noted in the lingula with associated soft tissue thickening in the left upper lobe airway. On my review, I note a soft tissue mass/nodule in the lingula, with the soft tissue thickening in the lingular bronchus (LB4 and LB5). There is also associated mediastinal lymphadenopathy.  Plan for robotic assisted navigational bronchoscopy to lingular nodule in addition to EBUS for evaluation of the hilar and mediastinal lymph nodes. Patient is appropriate for the procedure.   Jackson Chute, MD [facility] 12/02/2023 10:46 AM    End of visit medications:  Meds ordered this encounter  Medications   lactated ringers infusion   chlorhexidine (PERIDEX) 0.12 % solution    Powers, Jackson E: cabinet override   chlorhexidine (PERIDEX) 0.12 % solution 15 mL     Current Facility-Administered Medications:    lactated ringers infusion, , Intravenous, Continuous, Jackson Nation, MD, Last Rate: 10 mL/hr at 12/02/23 1019, New Bag at 12/02/23 1019  Facility-Administered Medications Ordered in Other Encounters:    sodium chloride flush (NS) 0.9 % injection 3 mL, 3 mL, Intravenous, Q12H, Jackson Nancy, MD   Subjective:   PATIENT ID: Jackson Powers GENDER: male DOB: [birth year], MRN: [id number]  No chief complaint on file.   HPI  Jackson Powers is a pleasant 78 year old male presenting today for biopsy of a LUL nodule.  Patient initially presented to our pulmonary clinic and was seen by Dr. Francine Powers for the evaluation of chronic cough of over 1 year in duration. A CT scan was obtained and he was noted to have a nodule/mass in the LUL/Lingula, which was noted to be FDG avid. He is referred for robotic assisted navigational bronchoscopy and EBUS.  Patient reports a chronic cough of over one year in duration, productive of yellowish sputum. He denies chest pain, chest tightness, fevers, chills, or night sweats. He also denies  hemoptysis or wheeze. He tried the Trelegy and discontinued it after not noticing a difference in symptoms. Last dose of Clopidogrel was on Friday.  Patient has a history of smoking, quit 35 years ago after smoking for 15 years. He worked as a Surveyor, mining.  Ancillary information including prior medications, full medical/surgical/family/social histories, and PFTs (when available) are listed below and have been reviewed.   Review of Systems  Constitutional:  Negative for chills, fever and weight loss.  Respiratory:  Positive for cough and sputum production. Negative for hemoptysis.   Cardiovascular:  Negative for chest pain.     Objective:   Vitals:   12/01/23 1049 12/02/23 1003  BP:  (!) 166/72  Pulse:  63  Resp:  16  Temp:  98.3 F (36.8 C)  TempSrc:  Oral  SpO2:  95%  Weight: 111.1 kg 111.1 kg  Height: 6\' 3"  (1.905 m) 6\' 3"  (1.905 m)   95% on RA BMI Readings from Last 3 Encounters:  12/02/23 30.62 kg/m  10/05/23 30.27 kg/m  06/15/23 30.75 kg/m   Wt Readings from Last 3 Encounters:  12/02/23 111.1 kg  10/05/23 109.9 kg  06/15/23 111.6 kg    Physical Exam Constitutional:      Appearance: Normal appearance.  Cardiovascular:     Rate and Rhythm: Normal rate and regular rhythm.     Pulses: Normal pulses.     Heart sounds: Normal heart sounds.  Pulmonary:     Effort: Pulmonary effort is normal.     Breath sounds:  Normal breath sounds. No wheezing.  Abdominal:     General: There is distension.     Palpations: Abdomen is soft.  Neurological:     General: No focal deficit present.     Mental Status: He is alert. Mental status is at baseline. He is disoriented.       Ancillary Information    Past Medical History:  Diagnosis Date   Acute gastritis without hemorrhage 2007   Arthritis    osteoarthritis    Benign neoplasm of colon 2007   COPD (chronic obstructive pulmonary disease) (HCC)    Esophagitis 2007   Hypertension    Orthopnea     reports " i have trouble breathing when i lie completely flat. i have to use 2 people to prop my head up"    Panic anxiety syndrome    patient reports having severe panic anxiety and claustraphobia. states with ear surgery years ago , they had to tie him down which caused him to panic even more;    Right BBB/left ant fasc block    (OLD)   TIA (transient ischemic attack)      History reviewed. No pertinent family history.   Past Surgical History:  Procedure Laterality Date   LEFT HEART CATH AND CORONARY ANGIOGRAPHY Left 10/01/2020   Procedure: LEFT HEART CATH AND CORONARY ANGIOGRAPHY;  Surgeon: Jackson Nancy, MD;  Location: [facility] INVASIVE CV LAB;  Service: Cardiovascular;  Laterality: Left;   SKIN CANCER EXCISION Right 2017   EAR; FAILED GRAFT FOLLWOING SURGERY    TOTAL HIP ARTHROPLASTY Right 07/20/2018   Procedure: RIGHT TOTAL HIP ARTHROPLASTY ANTERIOR APPROACH;  Surgeon: Jackson Gross, MD;  Location: [facility];  Service: Orthopedics;  Laterality: Right;    Social History   Socioeconomic History   Marital status: Married    Spouse name: Not on file   Number of children: Not on file   Years of education: Not on file   Highest education level: Not on file  Occupational History   Not on file  Tobacco Use   Smoking status: Former    Current packs/day: 0.00    Average packs/day: 2.0 packs/day for 15.0 years (30.0 ttl pk-yrs)    Types: Cigarettes    Start date: 32    Quit date: 25    Years since quitting: 36.0   Smokeless tobacco: Never  Vaping Use   Vaping status: Never Used  Substance and Sexual Activity   Alcohol use: Not Currently   Drug use: Never   Sexual activity: Not on file  Other Topics Concern   Not on file  Social History Narrative   Not on file   Social Drivers of Health   Financial Resource Strain: Not on file  Food Insecurity: Not on file  Transportation Needs: Not on file  Physical Activity: Not on file  Stress: Not on file  Social Connections:  Not on file  Intimate Partner Violence: Not on file     No Known Allergies   CBC    Component Value Date/Time   WBC 8.7 12/17/2020 1729   RBC 4.76 12/17/2020 1729   HGB 14.4 12/17/2020 1729   HCT 42.4 12/17/2020 1729   PLT 216 12/17/2020 1729   MCV 89.1 12/17/2020 1729   MCH 30.3 12/17/2020 1729   MCHC 34.0 12/17/2020 1729   RDW 13.0 12/17/2020 1729   LYMPHSABS 2.6 12/17/2020 1729   MONOABS 0.5 12/17/2020 1729   EOSABS 0.1 12/17/2020 1729   BASOSABS 0.0 12/17/2020 1729  Pulmonary Functions Testing Results:    Latest Ref Rng & Units 10/05/2023   10:29 AM  PFT Results  FVC-Pre L 4.25   FVC-Predicted Pre % 88   FVC-Post L 4.34   FVC-Predicted Post % 90   Pre FEV1/FVC % % 68   Post FEV1/FCV % % 67   FEV1-Pre L 2.90   FEV1-Predicted Pre % 83   FEV1-Post L 2.90   DLCO uncorrected ml/min/mmHg 24.92   DLCO UNC% % 89   DLCO corrected ml/min/mmHg 24.92   DLCO COR %Predicted % 89   DLVA Predicted % 92   TLC L 8.00   TLC % Predicted % 101   RV % Predicted % 121     @ENCMEDSTART @

## 2023-12-02 NOTE — Anesthesia Procedure Notes (Signed)
Procedure Name: Intubation Date/Time: 12/02/2023 11:05 AM  Performed by: Sandie Ano, CRNAPre-anesthesia Checklist: Patient identified, Emergency Drugs available, Suction available and Patient being monitored Patient Re-evaluated:Patient Re-evaluated prior to induction Oxygen Delivery Method: Circle System Utilized Preoxygenation: Pre-oxygenation with 100% oxygen Induction Type: IV induction Ventilation: Mask ventilation without difficulty Laryngoscope Size: Mac and 3 Grade View: Grade I Tube type: Oral Tube size: 8.5 mm Number of attempts: 1 Airway Equipment and Method: Stylet and Oral airway Placement Confirmation: ETT inserted through vocal cords under direct vision, positive ETCO2 and breath sounds checked- equal and bilateral Secured at: 23 cm Tube secured with: Tape Dental Injury: Teeth and Oropharynx as per pre-operative assessment

## 2023-12-02 NOTE — Transfer of Care (Signed)
Immediate Anesthesia Transfer of Care Note  Patient: Jackson Powers  Procedure(s) Performed: ROBOTIC ASSISTED NAVIGATIONAL BRONCHOSCOPY (Bilateral) BRONCHIAL NEEDLE ASPIRATION BIOPSIES BRONCHIAL BRUSHINGS BRONCHIAL BIOPSIES VIDEO BRONCHOSCOPY WITH ENDOBRONCHIAL ULTRASOUND BRONCHIAL WASHINGS  Patient Location: PACU  Anesthesia Type:General  Level of Consciousness: awake, alert , and oriented  Airway & Oxygen Therapy: Patient Spontanous Breathing  Post-op Assessment: Report given to RN and Post -op Vital signs reviewed and stable  Post vital signs: Reviewed and stable  Last Vitals:  Vitals Value Taken Time  BP 141/78 12/02/23 1237  Temp    Pulse 78 12/02/23 1241  Resp 22 12/02/23 1241  SpO2 91 % 12/02/23 1241  Vitals shown include unfiled device data.  Last Pain:  Vitals:   12/02/23 1012  TempSrc:   PainSc: 0-No pain         Complications: No notable events documented.

## 2023-12-02 NOTE — Op Note (Signed)
Video Bronchoscopy with Robotic Assisted Bronchoscopic Navigation   Date of Operation: 12/02/2023   Pre-op Diagnosis: Lung nodule  Surgeon: Raechel Chute, MD  Anesthesia: General endotracheal anesthesia  Operation: Flexible video fiberoptic bronchoscopy with robotic assistance and biopsies.  Estimated Blood Loss: Minimal  Complications: None  Indications and History: Jackson Powers is a 78 y.o. male with history of lung nodule presenting for robotic assisted navigational bronchoscopy and EBUS. The risks, benefits, complications, treatment options and expected outcomes were discussed with the patient.  The possibilities of pneumothorax, pneumonia, reaction to medication, pulmonary aspiration, perforation of a viscus, bleeding, failure to diagnose a condition and creating a complication requiring transfusion or operation were discussed with the patient who freely signed the consent.    Description of Procedure: The patient was seen in the Preoperative Area, was examined and was deemed appropriate to proceed.  The patient was taken to [facility] endoscopy room 3, identified as [name] and the procedure verified as Flexible Video Fiberoptic Bronchoscopy.  A Time Out was held and the above information confirmed.   Prior to the date of the procedure a high-resolution CT scan of the chest was performed. Utilizing ION software program a virtual tracheobronchial tree was generated to allow the creation of distinct navigation pathways to the patient's parenchymal abnormalities. After being taken to the operating room general anesthesia was initiated and the patient  was orally intubated. The video fiberoptic bronchoscope was introduced via the endotracheal tube and a general inspection was performed which showed normal right and left lung anatomy, aspiration of the bilateral mainstems was completed to remove any remaining secretions. Robotic catheter inserted into patient's endotracheal tube.   Target #1 LUL -  Lingular: The distinct navigation pathways prepared prior to this procedure were then utilized to navigate to patient's lesion identified on CT scan. The robotic catheter was secured into place and the vision probe was withdrawn.  Lesion location was approximated using fluoroscopy and 3D Cone beam CT for peripheral targeting. Under fluoroscopic guidance transbronchial brushings, transbronchial needle biopsies, and transbronchial forceps biopsies were performed to be sent for cytology.  We then switched to the EBUS portion and the hilar and mediastinal lymph nodes were evaluated. Enlarged lymph nodes were noted at stations 11R superior, 7, 4L and 11L. All were sampled with a 21G Olympus ViziShot 2 needle and sent for slide and cytology.  At the end of the procedure a general airway inspection was performed and there was no evidence of active bleeding. The bronchoscope was removed.  A BAL was performed in the lingula, 50 mL inserted, 20 mL withdrawn and sent for cytology The patient tolerated the procedure well. There was no significant blood loss and there were no obvious complications. A post-procedural chest x-ray is pending.  Samples Target #1: LUL nodule 1. Transbronchial brushings from LUL nodule 2. Transbronchial needle biopsies from LUL nodule 3. Transbronchial forceps biopsies from LUL nodule 4. Bronchoalveolar lavage from LUL - Lingula  EBUS to stations 11R, 7, 4L and 11L sequentially.  Plans:  The patient will be discharged from the PACU to home when recovered from anesthesia and after chest x-ray is reviewed. We will review the cytology, pathology and microbiology results with the patient when they become available. Outpatient followup will be with Dr. Francine Graven.  Raechel Chute, MD [facility] 12/02/2023 12:36 PM

## 2023-12-02 NOTE — Anesthesia Postprocedure Evaluation (Signed)
Anesthesia Post Note  Patient: Jackson Powers  Procedure(s) Performed: ROBOTIC ASSISTED NAVIGATIONAL BRONCHOSCOPY (Bilateral) BRONCHIAL NEEDLE ASPIRATION BIOPSIES BRONCHIAL BRUSHINGS BRONCHIAL BIOPSIES VIDEO BRONCHOSCOPY WITH ENDOBRONCHIAL ULTRASOUND BRONCHIAL WASHINGS     Patient location during evaluation: PACU Anesthesia Type: General Level of consciousness: awake and alert Pain management: pain level controlled Vital Signs Assessment: post-procedure vital signs reviewed and stable Respiratory status: spontaneous breathing, nonlabored ventilation, respiratory function stable and patient connected to nasal cannula oxygen Cardiovascular status: blood pressure returned to baseline and stable Postop Assessment: no apparent nausea or vomiting Anesthetic complications: no  No notable events documented.  Last Vitals:  Vitals:   12/02/23 1300 12/02/23 1315  BP: 114/78 120/65  Pulse: 65 (!) 59  Resp: 19 14  Temp:  36.4 C  SpO2: 93% 94%    Last Pain:  Vitals:   12/02/23 1237  TempSrc:   PainSc: 0-No pain                 Shelton Silvas

## 2023-12-02 NOTE — Anesthesia Preprocedure Evaluation (Addendum)
Anesthesia Evaluation  Patient identified by MRN, date of birth, ID band Patient awake    Reviewed: Allergy & Precautions, NPO status , Patient's Chart, lab work & pertinent test results  Airway Mallampati: II  TM Distance: >3 FB Neck ROM: Full    Dental no notable dental hx.    Pulmonary COPD, former smoker Lung nodule   Pulmonary exam normal breath sounds clear to auscultation       Cardiovascular hypertension (amlodipine, metoprolol), Pt. on medications and Pt. on home beta blockers + Orthopnea  Normal cardiovascular exam+ dysrhythmias (RBBB, 1st degree AV block, PACs)  Rhythm:Regular Rate:Normal  TTE 12/18/2020: IMPRESSIONS    1. Left ventricular ejection fraction, by estimation, is 60 to 65%. The  left ventricle has normal function. The left ventricle has no regional  wall motion abnormalities. There is mild concentric left ventricular  hypertrophy. Left ventricular diastolic  parameters are consistent with Grade I diastolic dysfunction (impaired  relaxation).   2. Right ventricular systolic function is normal. The right ventricular  size is normal.   3. The mitral valve is normal in structure. Trivial mitral valve  regurgitation. No evidence of mitral stenosis.   4. The aortic valve is normal in structure. Aortic valve regurgitation is  not visualized. Mild aortic valve sclerosis is present, with no evidence  of aortic valve stenosis.   5. The inferior vena cava is normal in size with greater than 50%  respiratory variability, suggesting right atrial pressure of 3 mmHg.   LHC 10/01/2020:  Mid RCA lesion is 30% stenosed.  Prox Cx lesion is 45% stenosed with 55% stenosed side branch in 1st Mrg.  Mid LAD lesion is 30% stenosed with 35% stenosed side branch in 2nd Diag.   Borderline bifurcating lesion in mid LCX and OM1, treat medically.    Neuro/Psych  PSYCHIATRIC DISORDERS Anxiety     TIA   GI/Hepatic    Endo/Other    Renal/GU      Musculoskeletal  (+) Arthritis , Osteoarthritis,    Abdominal   Peds  Hematology   Anesthesia Other Findings   Reproductive/Obstetrics                             Anesthesia Physical Anesthesia Plan  ASA: 3  Anesthesia Plan: General   Post-op Pain Management:    Induction: Intravenous  PONV Risk Score and Plan: 2 and Ondansetron, Dexamethasone and Treatment may vary due to age or medical condition  Airway Management Planned: Oral ETT  Additional Equipment:   Intra-op Plan:   Post-operative Plan: Extubation in OR  Informed Consent: I have reviewed the patients History and Physical, chart, labs and discussed the procedure including the risks, benefits and alternatives for the proposed anesthesia with the patient or authorized representative who has indicated his/her understanding and acceptance.     Dental advisory given  Plan Discussed with: CRNA  Anesthesia Plan Comments:        Anesthesia Quick Evaluation

## 2023-12-05 ENCOUNTER — Encounter (HOSPITAL_COMMUNITY): Payer: Self-pay | Admitting: Student in an Organized Health Care Education/Training Program

## 2023-12-06 LAB — CYTOLOGY - NON PAP

## 2023-12-07 ENCOUNTER — Telehealth: Payer: Self-pay | Admitting: Pulmonary Disease

## 2023-12-07 ENCOUNTER — Encounter: Payer: Self-pay | Admitting: Pulmonary Disease

## 2023-12-07 DIAGNOSIS — R911 Solitary pulmonary nodule: Secondary | ICD-10-CM

## 2023-12-07 DIAGNOSIS — C3492 Malignant neoplasm of unspecified part of left bronchus or lung: Secondary | ICD-10-CM

## 2023-12-07 NOTE — Telephone Encounter (Signed)
Called and spoke with pt, I did inform Jackson Powers that his daughter is not listed on DPR, he verbalized understanding. He will wait for results to be reviewed by Dr.Dewald.

## 2023-12-07 NOTE — Telephone Encounter (Signed)
Delice Bison would like results of Bronch procedure. Would like treatment options. Delice Bison phone number is [phone].

## 2023-12-07 NOTE — Telephone Encounter (Signed)
I have received the following mychart message from pt "I just saw the pathology results. When will I discuss treatment plans and with which doctor? "  Dr.Dewald please advise

## 2023-12-08 NOTE — Telephone Encounter (Signed)
thanks

## 2023-12-13 ENCOUNTER — Other Ambulatory Visit: Payer: Self-pay | Admitting: Physician Assistant

## 2023-12-13 DIAGNOSIS — R911 Solitary pulmonary nodule: Secondary | ICD-10-CM

## 2023-12-13 NOTE — Progress Notes (Unsigned)
[facility] [phone]   Fax:([phone]) [phone]  CONSULT NOTE  REFERRING PHYSICIAN: Dr. Aundria Rud   REASON FOR CONSULTATION:  Non-Small Cell Lung Cancer, favoring adenocarcinoma  HPI Jackson Powers is a 78 y.o. male with a past medical history significant for hypertension, TIA, and COPD is referred to the clinic for newly diagnosed non-small cell lung cancer.  The patient establish care with pulmonary medicine in July 2024 after having ongoing cough not responsive to antibiotics. The patient was noted to have a pulmonary nodule that had been followed by prior imaging. Therefore, they arranged for a follow up CT scan of the chest on 08/09/2023 to follow-up on his nodule history.  The scan showed a stable 11 mm juxtapleural right upper lobe nodule.  However, there was an airspace opacity which is nodular in the left perihilar region which is persistent and PET/CT was recommended.  The patient subsequently had a PET/CT scan that showed hypermetabolism with an SUV of 13.2 along the chronic left infrahilar region which had a nodular airspace opacity, suspicious for malignancy.  The right upper lobe subpleural nodule above the minor fissure measured 1.1 x 0.9 cm had SUV of 1.2 felt to be benign etiology but surveillance is recommended.  There was mildly enlarged AP window lymph node with maximum SUV of 4.9 and small left hilar lymph nodes with maximum SUV of 5.6 and small right hilar lymph node measuring 3.9 in which early malignancy involvement was not excluded.   The patient underwent bronchoscopy on 12/02/2023 by Dr. Aundria Rud.  The final pathology of the FNA of lymph node 7 and lymph node 11L showed non-small cell lung cancer. The biopsy of the LUL lingula FNA showed non-small cell lung cancer. IHC attempted but there was only scant tumor material. Thre was focal staining of TTF-1 and therefore this is favored lobe adenocarcinoma.   The patient was referred to the clinic to discuss  next steps in his care.  Overall, the patient is feeling "ok" today.  He denies any fever, chills, night sweats, or unexplained weight loss.  His breathing is "fine".  The patient may have dyspnea on exertion if he uses really exerting himself but overall he denies any significant dyspnea.  He reports that he is not cough very much.  He may get some cough with phlegm which is not new.  He uses Mucinex.  He denies any hemoptysis.  He denies any nausea, vomiting, diarrhea, or constipation.  Denies any headache or visual changes.  His family history consist of a father who passed away secondary to myocardial infarction at the age of 83.  His father had bypass surgery performed.  At the age of 67, she fell and hit her head and had a aneurysm.  His sisters have had heart attacks.    The patient used to work as a [employer].  He is currently active with working on cars.  He is married.  He has 3 children.  He is a former smoker having smoked approximately 15 to 20 years averaging 1 pack of cigarettes per day.  He quit smoking approximately 35 years ago.    HPI  Past Medical History:  Diagnosis Date   Acute gastritis without hemorrhage 2007   Arthritis    osteoarthritis    Benign neoplasm of colon 2007   COPD (chronic obstructive pulmonary disease) (HCC)    Esophagitis 2007   Hypertension    Orthopnea    reports " i have trouble breathing  when i lie completely flat. i have to use 2 people to prop my head up"    Panic anxiety syndrome    patient reports having severe panic anxiety and claustraphobia. states with ear surgery years ago , they had to tie him down which caused him to panic even more;    Right BBB/left ant fasc block    (OLD)   TIA (transient ischemic attack)     Past Surgical History:  Procedure Laterality Date   BRONCHIAL BIOPSY  12/02/2023   Procedure: BRONCHIAL BIOPSIES;  Surgeon: Raechel Chute, MD;  Location: [facility];  Service: Pulmonary;;   BRONCHIAL BRUSHINGS   12/02/2023   Procedure: BRONCHIAL BRUSHINGS;  Surgeon: Raechel Chute, MD;  Location: [facility];  Service: Pulmonary;;   BRONCHIAL NEEDLE ASPIRATION BIOPSY  12/02/2023   Procedure: BRONCHIAL NEEDLE ASPIRATION BIOPSIES;  Surgeon: Raechel Chute, MD;  Location: [facility];  Service: Pulmonary;;   BRONCHIAL WASHINGS  12/02/2023   Procedure: BRONCHIAL WASHINGS;  Surgeon: Raechel Chute, MD;  Location: [facility];  Service: Pulmonary;;   LEFT HEART CATH AND CORONARY ANGIOGRAPHY Left 10/01/2020   Procedure: LEFT HEART CATH AND CORONARY ANGIOGRAPHY;  Surgeon: Laurier Nancy, MD;  Location: [facility];  Service: Cardiovascular;  Laterality: Left;   SKIN CANCER EXCISION Right 2017   EAR; FAILED GRAFT FOLLWOING SURGERY    TOTAL HIP ARTHROPLASTY Right 07/20/2018   Procedure: RIGHT TOTAL HIP ARTHROPLASTY ANTERIOR APPROACH;  Surgeon: Ollen Gross, MD;  Location: [facility];  Service: Orthopedics;  Laterality: Right;   VIDEO BRONCHOSCOPY WITH ENDOBRONCHIAL ULTRASOUND  12/02/2023   Procedure: VIDEO BRONCHOSCOPY WITH ENDOBRONCHIAL ULTRASOUND;  Surgeon: Raechel Chute, MD;  Location: [facility];  Service: Pulmonary;;    No family history on file.  Social History Social History   Tobacco Use   Smoking status: Former    Current packs/day: 0.00    Average packs/day: 2.0 packs/day for 15.0 years (30.0 ttl pk-yrs)    Types: Cigarettes    Start date: 15    Quit date: 1989    Years since quitting: 36.0   Smokeless tobacco: Never  Vaping Use   Vaping status: Never Used  Substance Use Topics   Alcohol use: Not Currently   Drug use: Never    No Known Allergies  Current Outpatient Medications  Medication Sig Dispense Refill   amLODipine (NORVASC) 10 MG tablet Take 10 mg by mouth daily.     clopidogrel (PLAVIX) 75 MG tablet Take 75 mg by mouth daily.     fluticasone (FLONASE) 50 MCG/ACT nasal spray Place 1 spray into both nostrils daily. 16 g 2   metoprolol succinate (TOPROL-XL) 25 MG 24  hr tablet Take 25 mg by mouth daily.     Fluticasone-Umeclidin-Vilant (TRELEGY ELLIPTA) 100-62.5-25 MCG/ACT AEPB TAKE 1 PUFF BY MOUTH EVERY DAY (Patient not taking: Reported on 12/14/2023) 60 each 3   No current facility-administered medications for this visit.   Facility-Administered Medications Ordered in Other Visits  Medication Dose Route Frequency Provider Last Rate Last Admin   sodium chloride flush (NS) 0.9 % injection 3 mL  3 mL Intravenous Q12H Adrian Blackwater A, MD        REVIEW OF SYSTEMS:   Review of Systems  Constitutional: Negative for appetite change, chills, fatigue, fever and unexpected weight change.  HENT:Negative for mouth sores, nosebleeds, sore throat and trouble swallowing.   Eyes: Negative for eye problems and icterus.  Respiratory: Mild dyspnea on exertion depending on activity and only mild intermittent cough.  Negative for hemoptysis and wheezing.   Cardiovascular: Negative for chest pain and leg swelling.  Gastrointestinal: Negative for abdominal pain, constipation, diarrhea, nausea and vomiting.  Genitourinary: Negative for bladder incontinence, difficulty urinating, dysuria, frequency and hematuria.   Musculoskeletal: Negative for back pain, gait problem, neck pain and neck stiffness.  Skin: Negative for itching and rash.  Neurological: Negative for dizziness, extremity weakness, gait problem, headaches, light-headedness and seizures.  Hematological: Negative for adenopathy. Does not bruise/bleed easily.  Psychiatric/Behavioral: Negative for confusion, depression and sleep disturbance. The patient is not nervous/anxious.     PHYSICAL EXAMINATION:  Blood pressure (!) 160/77, pulse 75, temperature 98 F (36.7 C), temperature source Temporal, resp. rate 15, weight 252 lb 4.8 oz (114.4 kg), SpO2 96%.  ECOG PERFORMANCE STATUS: 0  Physical Exam  Constitutional: Oriented to person, place, and time and well-developed, well-nourished, and in no distress.  HENT:   Head: Normocephalic and atraumatic.  Mouth/Throat: Oropharynx is clear and moist. No oropharyngeal exudate.  Eyes: Conjunctivae are normal. Right eye exhibits no discharge. Left eye exhibits no discharge. No scleral icterus.  Neck: Normal range of motion. Neck supple.  Cardiovascular: Normal rate, regular rhythm, normal heart sounds and intact distal pulses.   Pulmonary/Chest: Effort normal and breath sounds normal. No respiratory distress. No wheezes. No rales.  Abdominal: Soft. Bowel sounds are normal. Exhibits no distension and no mass. There is no tenderness.  Musculoskeletal: Normal range of motion. Exhibits no edema.  Lymphadenopathy:    No cervical adenopathy.  Neurological: Alert and oriented to person, place, and time. Exhibits normal muscle tone. Gait normal. Coordination normal.  Skin: Skin is warm and dry. No rash noted. Not diaphoretic. No erythema. No pallor.  Psychiatric: Mood, memory and judgment normal.  Vitals reviewed.  LABORATORY DATA: Lab Results  Component Value Date   WBC 7.6 12/14/2023   HGB 14.6 12/14/2023   HCT 43.4 12/14/2023   MCV 88.2 12/14/2023   PLT 231 12/14/2023      Chemistry      Component Value Date/Time   NA 138 12/14/2023 1312   K 3.7 12/14/2023 1312   CL 104 12/14/2023 1312   CO2 28 12/14/2023 1312   BUN 16 12/14/2023 1312   CREATININE 0.93 12/14/2023 1312      Component Value Date/Time   CALCIUM 9.6 12/14/2023 1312   ALKPHOS 51 12/14/2023 1312   AST 20 12/14/2023 1312   ALT 22 12/14/2023 1312   BILITOT 0.5 12/14/2023 1312       RADIOGRAPHIC STUDIES: CT SUPER D CHEST WO CONTRAST Result Date: 12/02/2023 CLINICAL DATA:  Hypermetabolic left infrahilar nodular airspace disease. Intraoperative planning and guidance. EXAM: CT CHEST WITHOUT CONTRAST TECHNIQUE: Multidetector CT imaging of the chest was performed using thin slice collimation for electromagnetic bronchoscopy planning purposes, without intravenous contrast. RADIATION DOSE  REDUCTION: This exam was performed according to the departmental dose-optimization program which includes automated exposure control, adjustment of the mA and/or kV according to patient size and/or use of iterative reconstruction technique. COMPARISON:  Chest CT 08/09/2023.  PET-CT 10/20/2023. FINDINGS: Cardiovascular: Severe coronary artery atherosclerosis with lesser involvement of the aorta and great vessels. The heart size is normal. There is no pericardial effusion. Mediastinum/Nodes: There are no enlarged mediastinal, hilar or axillary lymph nodes. The thyroid gland, trachea and esophagus demonstrate no significant findings. Lungs/Pleura: No pleural effusion or pneumothorax. The infiltrative mass-like density along the anterior aspect of the left hilum appears enlarged compared with prior CT, measuring approximately 6.3 x 2.2 cm on  image 94/2. Similar chronic linear scarring or atelectasis in the left lower lobe. The well-circumscribed subpleural nodule posteriorly in the right upper lobe is stable, measuring 1.2 cm on image 79/3. No other new or enlarging pulmonary nodules. Moderate centrilobular and paraseptal emphysema. Upper abdomen: No acute findings are seen in the visualized upper abdomen. Mild hepatic steatosis and fatty replacement of the pancreas. No adrenal mass. Musculoskeletal/Chest wall: There is no chest wall mass or suspicious osseous finding. IMPRESSION: 1. CT for bronchoscopy planning and guidance. 2. The infiltrative mass-like density along the anterior aspect of the left hilum appears enlarged compared with prior CT, suspicious for progressive neoplasm. 3. Stable 1.2 cm subpleural nodule posteriorly in the right upper lobe. 4. No evidence of metastatic disease. 5. Aortic Atherosclerosis (ICD10-I70.0) and Emphysema (ICD10-J43.9). Electronically Signed   By: Carey Bullocks M.D.   On: 12/02/2023 13:30   DG Chest Port 1 View Result Date: 12/02/2023 CLINICAL DATA:  Post bronchoscopy with  bronchial needle aspiration biopsy. EXAM: PORTABLE CHEST 1 VIEW COMPARISON:  CT 11/30/2023.  PET-CT 10/19/2024. FINDINGS: 1248 hours. Interval increased patchy airspace disease within the lingula and left lower lobe, likely reflecting atelectasis or contusion related to interval biopsy. The right lung is clear. There is no evidence of pneumothorax or significant pleural effusion. The heart size and mediastinal contours are stable. IMPRESSION: Interval increased patchy airspace disease in the lingula and left lower lobe, likely reflecting atelectasis or contusion related to interval biopsy. No evidence of pneumothorax. Electronically Signed   By: Carey Bullocks M.D.   On: 12/02/2023 13:18   DG C-ARM BRONCHOSCOPY Result Date: 12/02/2023 C-ARM BRONCHOSCOPY: Fluoroscopy was utilized by the requesting physician.  No radiographic interpretation.    ASSESSMENT: This is a very pleasant 78 year old Caucasian male with likely stage IIIa non-small cell lung cancer, IHC favoring adenocarcinoma. The patient presented with left lung lesion and AP window and left hilar lymph node involvement. He was diagnosed in January 2025.   Dr. Arbutus Ped had a lengthly discussion with the patient today about his current condition and treatment options.  We have discussed the patient's case at the multidisciplinary conference this week.  They are wondering if he would be considered for surgical resection.  In his current state, he likely would not be considered for surgery but we can consider neoadjuvant treatment based on our discussion with surgery at the upcoming multidisiplinary conference this week.  I routed his PFTs to surgery to preview prior to conference. The patient is not a surgical candidate, then we likely would recommend concurrent chemoradiation.   Therefore we will see the patient back for follow-up visit next week after conference.  I will place any referrals after discussion at conference.   In the meantime, the  patient will need to complete the staging workup with a brain MRI.  I have placed the order.  Since this is favored to be adenocarcinoma, we may send molecular studies with [facility] (liquid) in the future. There was not enough tissue for molecular studies via tissue.   The patient voices understanding of current disease status and treatment options and is in agreement with the current care plan.  All questions were answered. The patient knows to call the clinic with any problems, questions or concerns. We can certainly see the patient much sooner if necessary.  Thank you so much for allowing me to participate in the care of Jackson Powers. I will continue to follow up the patient with you and assist in his care.  Disclaimer: This note was dictated with voice recognition software. Similar sounding words can inadvertently be transcribed and may not be corrected upon review.   Darcey Demma L Dheeraj Hail December 14, 2023, 4:42 PM  ADDENDUM: Hematology/Oncology Attending: I had a face-to-face encounter with the patient today.  I reviewed his record, lab, scan as well as the pathology report and recommended his care plan.  This is a very pleasant 78 years old white male with past medical history significant for COPD, hypertension, TIA as well as long history of smoking.  The patient was seen by a pulmonologist Dr. Melody Comas to establish care and also management of persistent cough that was not responding to previous course of antibiotics.  During his evaluation the patient has CT scan of the chest on 08/09/2023 and it showed a stable 1.1 cm juxtapleural right upper lobe nodule but there was also airspace opacity in the left perihilar region. This was followed by a PET/CT on October 17, 2023 and that showed the chronic left infrahilar nodular airspace opacity with primarily upper lobe and also lower lobe component along the medial pleural margin has maximum SUV of 13.2 suspicious for malignancy.   The right upper lobe subpleural nodule above the minor fissure measured 1.1 x 0.9 cm and has maximum SUV of 1.4.  Additionally the scan showed AP window lymph node measuring 1.1 cm in short axis with SUV max of 4.9 and a small left hilar lymph node with maximum SUV of 5.6 in addition to a small right hilar lymph node with maximum SUV of 3.9.  The patient was seen by Dr. Aundria Rud and underwent video bronchoscopy with robotic assisted bronchoscopic navigation on December 01, 2022.  The cytology ([id number]) from the fine-needle aspiration of the left upper lobe as well as the brushing of the left upper lobe lingula were consistent with non-small cell carcinoma favoring adenocarcinoma based on the immunohistochemical stains.  Needle aspiration of station 7 as well as station 11L were positive for non-small cell carcinoma. The patient was referred to me today for evaluation and recommendation regarding treatment of his condition. The patient is feeling fine with no concerning complaints.  He is very active. I had a lengthy discussion with the patient and his daughter today about his current disease stage, prognosis and treatment options. I explained to the patient that he has stage IIIa (T3, N2, M0) non-small cell lung cancer, favoring adenocarcinoma presented with left hilar soft tissue mass in addition to left hilar and mediastinal lymphadenopathy. I explained to the patient that his treatment option is likely to be with a course of concurrent chemoradiation with weekly carboplatin for AUC of 2 and paclitaxel 45 Mg/M2 for 6-7 weeks followed by consolidation treatment with immunotherapy.  The patient and his daughter are asking about a surgical option and I indicated for them that we will discuss his case at the weekly thoracic conference to get an opinion from the thoracic surgeon regarding his condition and whether he will be surgical candidate for resection following a course of neoadjuvant  chemoimmunotherapy. We will arrange for the patient to come back for follow-up visit in 7-10 days for reevaluation and more detailed discussion of his treatment options based on the discussion at the weekly thoracic conference. In the meantime we will order MRI of the brain to rule out brain metastasis. We will request his [facility] for molecular studies and PD-L1 expression if there is sufficient material.  If not we will consider  sending blood sample to [employer] for molecular studies. The patient and his daughter are in agreement with the current plan. He was advised to call immediately if he has any other concerning symptoms in the interval. The total time spent in the appointment was 90 minutes. Disclaimer: This note was dictated with voice recognition software. Similar sounding words can inadvertently be transcribed and may be missed upon review. Lajuana Matte, MD

## 2023-12-14 ENCOUNTER — Inpatient Hospital Stay: Payer: BC Managed Care – PPO | Attending: Physician Assistant | Admitting: Physician Assistant

## 2023-12-14 ENCOUNTER — Inpatient Hospital Stay: Payer: BC Managed Care – PPO

## 2023-12-14 VITALS — BP 160/77 | HR 75 | Temp 98.0°F | Resp 15 | Wt 252.3 lb

## 2023-12-14 DIAGNOSIS — Z7902 Long term (current) use of antithrombotics/antiplatelets: Secondary | ICD-10-CM | POA: Insufficient documentation

## 2023-12-14 DIAGNOSIS — C3492 Malignant neoplasm of unspecified part of left bronchus or lung: Secondary | ICD-10-CM

## 2023-12-14 DIAGNOSIS — R911 Solitary pulmonary nodule: Secondary | ICD-10-CM

## 2023-12-14 DIAGNOSIS — I10 Essential (primary) hypertension: Secondary | ICD-10-CM | POA: Insufficient documentation

## 2023-12-14 DIAGNOSIS — J432 Centrilobular emphysema: Secondary | ICD-10-CM | POA: Insufficient documentation

## 2023-12-14 DIAGNOSIS — Z87891 Personal history of nicotine dependence: Secondary | ICD-10-CM | POA: Diagnosis not present

## 2023-12-14 DIAGNOSIS — C3490 Malignant neoplasm of unspecified part of unspecified bronchus or lung: Secondary | ICD-10-CM | POA: Insufficient documentation

## 2023-12-14 DIAGNOSIS — C3412 Malignant neoplasm of upper lobe, left bronchus or lung: Secondary | ICD-10-CM | POA: Diagnosis not present

## 2023-12-14 DIAGNOSIS — Z79899 Other long term (current) drug therapy: Secondary | ICD-10-CM | POA: Insufficient documentation

## 2023-12-14 DIAGNOSIS — Z8673 Personal history of transient ischemic attack (TIA), and cerebral infarction without residual deficits: Secondary | ICD-10-CM | POA: Diagnosis not present

## 2023-12-14 HISTORY — DX: Malignant neoplasm of unspecified part of unspecified bronchus or lung: C34.90

## 2023-12-14 LAB — CBC WITH DIFFERENTIAL (CANCER CENTER ONLY)
Abs Immature Granulocytes: 0.02 10*3/uL (ref 0.00–0.07)
Basophils Absolute: 0 10*3/uL (ref 0.0–0.1)
Basophils Relative: 0 %
Eosinophils Absolute: 0.2 10*3/uL (ref 0.0–0.5)
Eosinophils Relative: 2 %
HCT: 43.4 % (ref 39.0–52.0)
Hemoglobin: 14.6 g/dL (ref 13.0–17.0)
Immature Granulocytes: 0 %
Lymphocytes Relative: 36 %
Lymphs Abs: 2.7 10*3/uL (ref 0.7–4.0)
MCH: 29.7 pg (ref 26.0–34.0)
MCHC: 33.6 g/dL (ref 30.0–36.0)
MCV: 88.2 fL (ref 80.0–100.0)
Monocytes Absolute: 0.3 10*3/uL (ref 0.1–1.0)
Monocytes Relative: 5 %
Neutro Abs: 4.3 10*3/uL (ref 1.7–7.7)
Neutrophils Relative %: 57 %
Platelet Count: 231 10*3/uL (ref 150–400)
RBC: 4.92 MIL/uL (ref 4.22–5.81)
RDW: 13.3 % (ref 11.5–15.5)
WBC Count: 7.6 10*3/uL (ref 4.0–10.5)
nRBC: 0 % (ref 0.0–0.2)

## 2023-12-14 LAB — CMP (CANCER CENTER ONLY)
ALT: 22 U/L (ref 0–44)
AST: 20 U/L (ref 15–41)
Albumin: 4.3 g/dL (ref 3.5–5.0)
Alkaline Phosphatase: 51 U/L (ref 38–126)
Anion gap: 6 (ref 5–15)
BUN: 16 mg/dL (ref 8–23)
CO2: 28 mmol/L (ref 22–32)
Calcium: 9.6 mg/dL (ref 8.9–10.3)
Chloride: 104 mmol/L (ref 98–111)
Creatinine: 0.93 mg/dL (ref 0.61–1.24)
GFR, Estimated: >60 mL/min (ref >60)
Glucose, Bld: 141 mg/dL — ABNORMAL HIGH (ref 70–99)
Potassium: 3.7 mmol/L (ref 3.5–5.1)
Sodium: 138 mmol/L (ref 135–145)
Total Bilirubin: 0.5 mg/dL (ref 0.0–1.2)
Total Protein: 7.3 g/dL (ref 6.5–8.1)

## 2023-12-14 NOTE — Patient Instructions (Addendum)
-  It was nice meeting you today.  I know we covered a lot of important information at your appointment.  Here is a summary of the main take away points. -Based on your biopsy results, this was consistent with non-small cell lung cancer.  There is not a lot of material for further testing but the pathologist is favoring that this is adenocarcinoma based on one of the specimens.  Adenocarcinoma is the most common type of lung cancer.  -Based on your PET scan, this appears to be stage III.  The lymph node involvement in the chest is what makes this stage III. -There are some patients who may be considered for surgery.  However, they may need treatment prior to surgery to shrink the cancer.  We called his neoadjuvant treatment which is generally combination of chemotherapy and immunotherapy.  We are discussing your case at the conference Thursday morning and can review your scan.  I have sent the results of your pulmonary function test that were performed in November to one of the surgeons to review prior to the conference -If you are not a candidate for surgery, then treatment typically consists of chemotherapy and radiation.  Chemotherapy is a low dose to help the radiation work better.  This is given weekly on Mondays for approximately 5-7 weeks (average is 6). Radiation is given daily Monday-Friday for 6 weeks.  -We will see back for follow-up visit next week to discuss the official plan in more detail.  We will let you know after the conference this week what was discussed and if any referrals need to be placed (i.e. radiation oncology versus surgery)

## 2023-12-15 NOTE — Progress Notes (Signed)
I reached out to the pt's dtr to arrange the pt's brain MRI. I spoke with Delice Bison and introduced myself as the pt's nurse navigator. Delice Bison states her schedule is flexible and would like the Brain MRI to be done as soon as possible and that they are willing to go to [facility] or [place]. I verbalized understanding.   I reached out to CS and arranged the pts brain MRI for 2/5 at 12:00 at [facility].   I called the pt at his home number and was able to reach him. I introduced myself as his nurse navigator and explained my role. I let him know I was able to schedule his brain MRI for 2/5. Pt asked that I call his dtr know about the MRI as she is the one who keeps track of his appts. Pt didn't have any questions at this time.   I called pt's dtr back and left a VM with the details and left my direct number for her to call me back with any questions.

## 2023-12-15 NOTE — Progress Notes (Signed)
Pts dtr Delice Bison called me concerned that the pts MRI wasn't scheduled until 2/5 and his f/u appt with Dr Arbutus Ped is on 2/6. Delice Bison is worried the report for the MRI won't be available and wanted to reschedule the pt's f/u with Dr Arbutus Ped. I let Delice Bison know I would call CS and see if there is an earlier date available.   I called CS and was able to move the pt's brain MRI up to 2/3 at 4pm at [facility].   I called Delice Bison back with the new date and time of the pt's MRI. Delice Bison repeated date and time back to me and appreciated the adjustment in the pts schedule.

## 2023-12-16 ENCOUNTER — Other Ambulatory Visit: Payer: Self-pay

## 2023-12-16 NOTE — Progress Notes (Signed)
I called the pts dtr, Delice Bison, to let her know the recommendations from the tumor board discussion we had about the pt this morning.  I let her know that the possibility of surgery is there, but that the pt will need neoadjuvant chemotherapy prior and whether or not he can get surgery would be based on his response to treatment. I told Delice Bison once we have the pts brain MRI we will have the complete clinical picture and a treatment plan can be discussed when they see Dr Arbutus Ped on 2/6. Delice Bison appreciated the information provided and had no additional questions at this time.

## 2023-12-17 NOTE — Progress Notes (Signed)
 The proposed treatment discussed in conference is for discussion purpose only and is not a binding recommendation.  The patients have not been physically examined, or presented with their treatment options.  Therefore, final treatment plans cannot be decided.

## 2023-12-20 ENCOUNTER — Ambulatory Visit (HOSPITAL_COMMUNITY)
Admission: RE | Admit: 2023-12-20 | Discharge: 2023-12-20 | Disposition: A | Discharge status: Home / Self Care | Payer: BC Managed Care – PPO | Source: Ambulatory Visit | Attending: Physician Assistant | Admitting: Physician Assistant

## 2023-12-20 DIAGNOSIS — C3492 Malignant neoplasm of unspecified part of left bronchus or lung: Secondary | ICD-10-CM | POA: Insufficient documentation

## 2023-12-20 MED ORDER — GADOBUTROL 1 MMOL/ML IV SOLN
10.0000 mL | Freq: Once | INTRAVENOUS | Status: AC | PRN
  Administered 2023-12-20: 10 mL via INTRAVENOUS

## 2023-12-22 ENCOUNTER — Other Ambulatory Visit (HOSPITAL_COMMUNITY): Payer: BC Managed Care – PPO

## 2023-12-22 NOTE — Progress Notes (Signed)
 Reached out to reading room to have pt's MRI brain report available for pt's appt on 2/6.

## 2023-12-23 ENCOUNTER — Inpatient Hospital Stay: Payer: BC Managed Care – PPO

## 2023-12-23 ENCOUNTER — Inpatient Hospital Stay: Payer: BC Managed Care – PPO | Attending: Physician Assistant | Admitting: Internal Medicine

## 2023-12-23 VITALS — BP 141/58 | HR 64 | Temp 98.1°F | Resp 18 | Ht 74.5 in | Wt 246.0 lb

## 2023-12-23 DIAGNOSIS — C3412 Malignant neoplasm of upper lobe, left bronchus or lung: Secondary | ICD-10-CM | POA: Diagnosis not present

## 2023-12-23 DIAGNOSIS — Z79899 Other long term (current) drug therapy: Secondary | ICD-10-CM | POA: Diagnosis not present

## 2023-12-23 DIAGNOSIS — Z5111 Encounter for antineoplastic chemotherapy: Secondary | ICD-10-CM | POA: Insufficient documentation

## 2023-12-23 MED ORDER — PROCHLORPERAZINE MALEATE 10 MG PO TABS: 10.0000 mg | ORAL_TABLET | Freq: Four times a day (QID) | ORAL | 1 refills | Status: DC | PRN

## 2023-12-23 MED ORDER — ONDANSETRON HCL 8 MG PO TABS: 8.0000 mg | ORAL_TABLET | Freq: Three times a day (TID) | ORAL | 1 refills | Status: DC | PRN

## 2023-12-23 MED ORDER — CYANOCOBALAMIN 1000 MCG/ML IJ SOLN
1000.0000 ug | Freq: Once | INTRAMUSCULAR | Status: AC
  Administered 2023-12-23: 1000 ug via INTRAMUSCULAR
  Filled 2023-12-23: qty 1

## 2023-12-23 MED ORDER — FOLIC ACID 1 MG PO TABS: 1.0000 mg | ORAL_TABLET | Freq: Every day | ORAL | 3 refills | Status: DC

## 2023-12-23 NOTE — Progress Notes (Signed)
 [facility] [phone]   [phone]  OFFICE PROGRESS NOTE  Montey Lot, PA-C [address] [place] [address]  DIAGNOSIS: Stage IIIA (T3, N2, M0) non-small cell lung cancer, favoring adenocarcinoma presented with left hilar soft tissue mass in addition to left hilar and mediastinal lymphadenopathy diagnosed in January 2025.   PRIOR THERAPY: None  CURRENT THERAPY: Neoadjuvant systemic chemotherapy with carboplatin  for AUC of 5, Alimta  500 Mg/M2 and nivolumab  360 Mg IV every 3 weeks.  First dose December 30, 2023.  INTERVAL HISTORY: Jackson Powers 78 y.o. male returns to the clinic today for   MEDICAL HISTORY: Past Medical History:  Diagnosis Date   Acute gastritis without hemorrhage 2007   Arthritis    osteoarthritis    Benign neoplasm of colon 2007   COPD (chronic obstructive pulmonary disease) (HCC)    Esophagitis 2007   Hypertension    Orthopnea    reports  i have trouble breathing when i lie completely flat. i have to use 2 people to prop my head up    Panic anxiety syndrome    patient reports having severe panic anxiety and claustraphobia. states with ear surgery years ago , they had to tie him down which caused him to panic even more;    Right BBB/left ant fasc block    (OLD)   TIA (transient ischemic attack)     ALLERGIES:  has no known allergies.  MEDICATIONS:  Current Outpatient Medications  Medication Sig Dispense Refill   amLODipine  (NORVASC ) 10 MG tablet Take 10 mg by mouth daily.     clopidogrel  (PLAVIX ) 75 MG tablet Take 75 mg by mouth daily.     fluticasone  (FLONASE ) 50 MCG/ACT nasal spray Place 1 spray into both nostrils daily. 16 g 2   Fluticasone -Umeclidin-Vilant (TRELEGY ELLIPTA ) 100-62.5-25 MCG/ACT AEPB TAKE 1 PUFF BY MOUTH EVERY DAY (Patient not taking: Reported on 12/14/2023) 60 each 3   metoprolol  succinate (TOPROL -XL) 25 MG 24 hr tablet Take 25 mg by mouth daily.     No current facility-administered  medications for this visit.   Facility-Administered Medications Ordered in Other Visits  Medication Dose Route Frequency Provider Last Rate Last Admin   sodium chloride  flush (NS) 0.9 % injection 3 mL  3 mL Intravenous Q12H Fernand Denyse LABOR, MD        SURGICAL HISTORY:  Past Surgical History:  Procedure Laterality Date   BRONCHIAL BIOPSY  12/02/2023   Procedure: BRONCHIAL BIOPSIES;  Surgeon: Isadora Hose, MD;  Location: [facility];  Service: Pulmonary;;   BRONCHIAL BRUSHINGS  12/02/2023   Procedure: BRONCHIAL BRUSHINGS;  Surgeon: Isadora Hose, MD;  Location: [facility];  Service: Pulmonary;;   BRONCHIAL NEEDLE ASPIRATION BIOPSY  12/02/2023   Procedure: BRONCHIAL NEEDLE ASPIRATION BIOPSIES;  Surgeon: Isadora Hose, MD;  Location: [facility];  Service: Pulmonary;;   BRONCHIAL WASHINGS  12/02/2023   Procedure: BRONCHIAL WASHINGS;  Surgeon: Isadora Hose, MD;  Location: [facility];  Service: Pulmonary;;   LEFT HEART CATH AND CORONARY ANGIOGRAPHY Left 10/01/2020   Procedure: LEFT HEART CATH AND CORONARY ANGIOGRAPHY;  Surgeon: Fernand Denyse LABOR, MD;  Location: [facility];  Service: Cardiovascular;  Laterality: Left;   SKIN CANCER EXCISION Right 2017   EAR; FAILED GRAFT FOLLWOING SURGERY    TOTAL HIP ARTHROPLASTY Right 07/20/2018   Procedure: RIGHT TOTAL HIP ARTHROPLASTY ANTERIOR APPROACH;  Surgeon: Melodi Lerner, MD;  Location: [facility];  Service: Orthopedics;  Laterality: Right;   VIDEO BRONCHOSCOPY  WITH ENDOBRONCHIAL ULTRASOUND  12/02/2023   Procedure: VIDEO BRONCHOSCOPY WITH ENDOBRONCHIAL ULTRASOUND;  Surgeon: Isadora Hose, MD;  Location: [facility];  Service: Pulmonary;;    REVIEW OF SYSTEMS:  Constitutional: positive for fatigue Eyes: negative Ears, nose, mouth, throat, and face: negative Respiratory: negative Cardiovascular: negative Gastrointestinal: negative Genitourinary:negative Integument/breast: negative Hematologic/lymphatic:  negative Musculoskeletal:negative Neurological: negative Behavioral/Psych: negative Endocrine: negative Allergic/Immunologic: negative   PHYSICAL EXAMINATION: General appearance: alert, cooperative, fatigued, and no distress Head: Normocephalic, without obvious abnormality, atraumatic Neck: no adenopathy, no JVD, supple, symmetrical, trachea midline, and thyroid  not enlarged, symmetric, no tenderness/mass/nodules Lymph nodes: Cervical, supraclavicular, and axillary nodes normal. Resp: clear to auscultation bilaterally Back: symmetric, no curvature. ROM normal. No CVA tenderness. Cardio: regular rate and rhythm, S1, S2 normal, no murmur, click, rub or gallop GI: soft, non-tender; bowel sounds normal; no masses,  no organomegaly Extremities: extremities normal, atraumatic, no cyanosis or edema Neurologic: Alert and oriented X 3, normal strength and tone. Normal symmetric reflexes. Normal coordination and gait  ECOG PERFORMANCE STATUS: 1 - Symptomatic but completely ambulatory  Blood pressure (!) 141/58, pulse 64, temperature 98.1 F (36.7 C), temperature source Temporal, resp. rate 18, height 6' 2.5 (1.892 m), weight 246 lb (111.6 kg), SpO2 96%.  LABORATORY DATA: Lab Results  Component Value Date   WBC 7.6 12/14/2023   HGB 14.6 12/14/2023   HCT 43.4 12/14/2023   MCV 88.2 12/14/2023   PLT 231 12/14/2023      Chemistry      Component Value Date/Time   NA 138 12/14/2023 1312   K 3.7 12/14/2023 1312   CL 104 12/14/2023 1312   CO2 28 12/14/2023 1312   BUN 16 12/14/2023 1312   CREATININE 0.93 12/14/2023 1312      Component Value Date/Time   CALCIUM  9.6 12/14/2023 1312   ALKPHOS 51 12/14/2023 1312   AST 20 12/14/2023 1312   ALT 22 12/14/2023 1312   BILITOT 0.5 12/14/2023 1312       RADIOGRAPHIC STUDIES: MR BRAIN W WO CONTRAST Result Date: 12/22/2023 CLINICAL DATA:  Provided history: Malignant neoplasm of left lung, unspecified part of lung. Metastatic disease evaluation.  EXAM: MRI HEAD WITHOUT AND WITH CONTRAST TECHNIQUE: Multiplanar, multiecho pulse sequences of the brain and surrounding structures were obtained without and with intravenous contrast. CONTRAST:  10mL GADAVIST  GADOBUTROL  1 MMOL/ML IV SOLN COMPARISON:  MRI brain and MRA head 12/17/2020. FINDINGS: Brain: No age-advanced or lobar predominant parenchymal atrophy. Chronic lacunar infarct within the right lentiform nucleus/retrolenticular white matter. Mild multifocal T2 FLAIR hyperintense signal abnormality elsewhere within the cerebral white matter, nonspecific but compatible with chronic small vessel ischemic disease. There is no acute infarct. No evidence of an intracranial mass. No chronic intracranial blood products. No extra-axial fluid collection. No midline shift. No pathologic intracranial enhancement identified. 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 Flow voids preserved elsewhere within the proximal large arterial vessels. Skull and upper cervical spine: No focal worrisome marrow lesion. Sinuses/Orbits: No mass or acute finding within the imaged orbits. Moderate mucosal thickening within the right maxillary sinus. Mild mucosal thickening within the left maxillary, bilateral ethmoid and bilateral frontal sinuses. IMPRESSION: 1. No evidence of intracranial metastatic disease. 2. Chronic lacunar infarct within the right lentiform nucleus/retrolenticular white matter, unchanged from the prior brain MRI of 12/17/2020. 3. Background mild cerebral white matter chronic small vessel disease. 4. 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 5. Paranasal sinus disease as described. Electronically Signed   By: Rockey  Richelle D.O.   On: 12/22/2023 16:28   CT SUPER D CHEST WO CONTRAST Result Date: 12/02/2023 CLINICAL DATA:  Hypermetabolic left infrahilar nodular airspace disease. Intraoperative planning and guidance. EXAM: CT CHEST WITHOUT CONTRAST TECHNIQUE:  Multidetector CT imaging of the chest was performed using thin slice collimation for electromagnetic bronchoscopy planning purposes, without intravenous contrast. RADIATION DOSE REDUCTION: This exam was performed according to the departmental dose-optimization program which includes automated exposure control, adjustment of the mA and/or kV according to patient size and/or use of iterative reconstruction technique. COMPARISON:  Chest CT 08/09/2023.  PET-CT 10/20/2023. FINDINGS: Cardiovascular: Severe coronary artery atherosclerosis with lesser involvement of the aorta and great vessels. The heart size is normal. There is no pericardial effusion. Mediastinum/Nodes: There are no enlarged mediastinal, hilar or axillary lymph nodes. The thyroid  gland, trachea and esophagus demonstrate no significant findings. Lungs/Pleura: No pleural effusion or pneumothorax. The infiltrative mass-like density along the anterior aspect of the left hilum appears enlarged compared with prior CT, measuring approximately 6.3 x 2.2 cm on image 94/2. Similar chronic linear scarring or atelectasis in the left lower lobe. The well-circumscribed subpleural nodule posteriorly in the right upper lobe is stable, measuring 1.2 cm on image 79/3. No other new or enlarging pulmonary nodules. Moderate centrilobular and paraseptal emphysema. Upper abdomen: No acute findings are seen in the visualized upper abdomen. Mild hepatic steatosis and fatty replacement of the pancreas. No adrenal mass. Musculoskeletal/Chest wall: There is no chest wall mass or suspicious osseous finding. IMPRESSION: 1. CT for bronchoscopy planning and guidance. 2. The infiltrative mass-like density along the anterior aspect of the left hilum appears enlarged compared with prior CT, suspicious for progressive neoplasm. 3. Stable 1.2 cm subpleural nodule posteriorly in the right upper lobe. 4. No evidence of metastatic disease. 5. Aortic Atherosclerosis (ICD10-I70.0) and Emphysema  (ICD10-J43.9). Electronically Signed   By: Elsie Perone M.D.   On: 12/02/2023 13:30   DG Chest Port 1 View Result Date: 12/02/2023 CLINICAL DATA:  Post bronchoscopy with bronchial needle aspiration biopsy. EXAM: PORTABLE CHEST 1 VIEW COMPARISON:  CT 11/30/2023.  PET-CT 10/19/2024. FINDINGS: 1248 hours. Interval increased patchy airspace disease within the lingula and left lower lobe, likely reflecting atelectasis or contusion related to interval biopsy. The right lung is clear. There is no evidence of pneumothorax or significant pleural effusion. The heart size and mediastinal contours are stable. IMPRESSION: Interval increased patchy airspace disease in the lingula and left lower lobe, likely reflecting atelectasis or contusion related to interval biopsy. No evidence of pneumothorax. Electronically Signed   By: Elsie Perone M.D.   On: 12/02/2023 13:18   DG C-ARM BRONCHOSCOPY Result Date: 12/02/2023 C-ARM BRONCHOSCOPY: Fluoroscopy was utilized by the requesting physician.  No radiographic interpretation.    ASSESSMENT AND PLAN: This is a very pleasant 78 years old white male with Stage IIIA (T3, N2, M0) non-small cell lung cancer, favoring adenocarcinoma presented with left hilar soft tissue mass in addition to left hilar and mediastinal lymphadenopathy diagnosed in January 2025.      Stage IIIA Non-Small Cell Lung Cancer (NSCLC) favoring adenocarcinoma Diagnosed with stage IIIA NSCLC favoring adenocarcinoma, with a left lung mass and left hilar/mediastinal lymph node involvement. MRI of the brain was negative. Pulmonary function tests are suitable for surgery. Discussed treatment options: neoadjuvant chemoimmunotherapy followed by surgery versus chemoradiation. Surgery is preferred for potential cure, with neoadjuvant chemoimmunotherapy to shrink the tumor before resection. Risks of chemoimmunotherapy include nausea and drowsiness. Surgery will involve resection of the left lung lobe  and  hilar/mediastinal lymph node, potentially using robotic-assisted laparoscopic surgery. If the tumor does not shrink, chemoradiation remains an option. Starting with chemoradiation would complicate later surgery. Emphasized the importance of comprehensive molecular marker testing to guide potential targeted therapy. - Administer neoadjuvant chemoimmunotherapy (carboplatin , pemetrexed , nivolumab ) every three weeks for three cycles - Order comprehensive molecular marker testing via blood test - Administer B12 injection today and every nine weeks - Prescribe folic acid  daily - Prescribe Zofran  and Compazine  for nausea as needed - Schedule chemo class before starting treatment next week - Schedule first chemo treatment for next week, either Wednesday or Thursday - Perform follow-up scan after nine weeks of treatment to assess tumor response - Refer to Dr. Kerrin for surgical evaluation post-treatment  General Health Maintenance Discussed the importance of B12 supplementation and management of side effects during chemotherapy. - Administer B12 injection today and every nine weeks - Prescribe folic acid  daily - Prescribe Zofran  and Compazine  for nausea as needed  Follow-up - Schedule first chemo treatment for next week, either Wednesday or Thursday - Perform follow-up scan after nine weeks of treatment to assess tumor response - Refer to Dr. Kerrin for surgical evaluation post-treatment.   The patient was advised to call immediately if he has any concerning symptoms in the interval. The patient voices understanding of current disease status and treatment options and is in agreement with the current care plan.  All questions were answered. The patient knows to call the clinic with any problems, questions or concerns. We can certainly see the patient much sooner if necessary.  The total time spent in the appointment was 30 minutes.  Disclaimer: This note was dictated with voice  recognition software. Similar sounding words can inadvertently be transcribed and may not be corrected upon review.

## 2023-12-23 NOTE — Progress Notes (Signed)
 START ON PATHWAY REGIMEN - Non-Small Cell Lung     A cycle is every 21 days:     Pemetrexed       Carboplatin    **Always confirm dose/schedule in your pharmacy ordering system**  Patient Characteristics: Preoperative or Nonsurgical Candidate (Clinical Staging), Stage IIB (N2a only) or Stage IIIA - Surgical Candidate, EGFR Positive/Unknown or ALK Positive/Unknown, Nonsquamous Cell Therapeutic Status: Preoperative or Nonsurgical Candidate (Clinical Staging) AJCC T Category: cT3 AJCC N Category: cN2a AJCC M Category: cM0 AJCC 9 Stage Grouping: IIIA Check here if patient was staged using an edition other than AJCC Staging 9th Edition: false Histology: Nonsquamous Cell ALK Fusion/Rearrangement Status: Awaiting Test Results EGFR Mutation Status: Awaiting Test Results Intent of Therapy: Curative Intent, Discussed with Patient

## 2023-12-24 ENCOUNTER — Other Ambulatory Visit: Payer: Self-pay | Admitting: Physician Assistant

## 2023-12-24 ENCOUNTER — Other Ambulatory Visit: Payer: Self-pay

## 2023-12-25 ENCOUNTER — Telehealth: Payer: Self-pay | Admitting: Internal Medicine

## 2023-12-27 ENCOUNTER — Telehealth: Payer: Self-pay | Admitting: Internal Medicine

## 2023-12-28 ENCOUNTER — Inpatient Hospital Stay: Payer: BC Managed Care – PPO

## 2023-12-28 MED FILL — Fosaprepitant Dimeglumine For IV Infusion 150 MG (Base Eq): INTRAVENOUS | Qty: 5 | Status: AC

## 2023-12-29 ENCOUNTER — Encounter: Payer: Self-pay | Admitting: Internal Medicine

## 2023-12-29 ENCOUNTER — Inpatient Hospital Stay: Payer: BC Managed Care – PPO

## 2023-12-29 VITALS — BP 154/79 | HR 63 | Temp 98.0°F | Resp 16 | Wt 244.4 lb

## 2023-12-29 DIAGNOSIS — C3412 Malignant neoplasm of upper lobe, left bronchus or lung: Secondary | ICD-10-CM

## 2023-12-29 DIAGNOSIS — Z5111 Encounter for antineoplastic chemotherapy: Secondary | ICD-10-CM | POA: Diagnosis not present

## 2023-12-29 LAB — CBC WITH DIFFERENTIAL (CANCER CENTER ONLY)
Abs Immature Granulocytes: 0.01 10*3/uL (ref 0.00–0.07)
Basophils Absolute: 0 10*3/uL (ref 0.0–0.1)
Basophils Relative: 0 %
Eosinophils Absolute: 0.2 10*3/uL (ref 0.0–0.5)
Eosinophils Relative: 3 %
HCT: 45.8 % (ref 39.0–52.0)
Hemoglobin: 15.3 g/dL (ref 13.0–17.0)
Immature Granulocytes: 0 %
Lymphocytes Relative: 36 %
Lymphs Abs: 2.6 10*3/uL (ref 0.7–4.0)
MCH: 29.7 pg (ref 26.0–34.0)
MCHC: 33.4 g/dL (ref 30.0–36.0)
MCV: 88.8 fL (ref 80.0–100.0)
Monocytes Absolute: 0.4 10*3/uL (ref 0.1–1.0)
Monocytes Relative: 5 %
Neutro Abs: 4.1 10*3/uL (ref 1.7–7.7)
Neutrophils Relative %: 56 %
Platelet Count: 279 10*3/uL (ref 150–400)
RBC: 5.16 MIL/uL (ref 4.22–5.81)
RDW: 13.1 % (ref 11.5–15.5)
WBC Count: 7.2 10*3/uL (ref 4.0–10.5)
nRBC: 0 % (ref 0.0–0.2)

## 2023-12-29 LAB — CMP (CANCER CENTER ONLY)
ALT: 24 U/L (ref 0–44)
AST: 19 U/L (ref 15–41)
Albumin: 4.3 g/dL (ref 3.5–5.0)
Alkaline Phosphatase: 51 U/L (ref 38–126)
Anion gap: 6 (ref 5–15)
BUN: 18 mg/dL (ref 8–23)
CO2: 27 mmol/L (ref 22–32)
Calcium: 9.6 mg/dL (ref 8.9–10.3)
Chloride: 107 mmol/L (ref 98–111)
Creatinine: 0.88 mg/dL (ref 0.61–1.24)
GFR, Estimated: >60 mL/min (ref >60)
Glucose, Bld: 166 mg/dL — ABNORMAL HIGH (ref 70–99)
Potassium: 4.1 mmol/L (ref 3.5–5.1)
Sodium: 140 mmol/L (ref 135–145)
Total Bilirubin: 0.5 mg/dL (ref 0.0–1.2)
Total Protein: 7.5 g/dL (ref 6.5–8.1)

## 2023-12-29 LAB — TSH: TSH: 1.426 u[IU]/mL (ref 0.350–4.500)

## 2023-12-29 MED ORDER — DEXAMETHASONE SODIUM PHOSPHATE 10 MG/ML IJ SOLN
10.0000 mg | Freq: Once | INTRAMUSCULAR | Status: AC
Start: 2023-12-29 — End: 2023-12-29
  Administered 2023-12-29: 10 mg via INTRAVENOUS
  Filled 2023-12-29: qty 1

## 2023-12-29 MED ORDER — SODIUM CHLORIDE 0.9 % IV SOLN
360.0000 mg | Freq: Once | INTRAVENOUS | Status: AC
  Administered 2023-12-29: 360 mg via INTRAVENOUS
  Filled 2023-12-29: qty 12

## 2023-12-29 MED ORDER — SODIUM CHLORIDE 0.9 % IV SOLN
500.0000 mg/m2 | Freq: Once | INTRAVENOUS | Status: AC
  Administered 2023-12-29: 1200 mg via INTRAVENOUS
  Filled 2023-12-29: qty 40

## 2023-12-29 MED ORDER — SODIUM CHLORIDE 0.9 % IV SOLN: INTRAVENOUS | Status: DC

## 2023-12-29 MED ORDER — FOSAPREPITANT DIMEGLUMINE INJECTION 150 MG
150.0000 mg | Freq: Once | INTRAVENOUS | Status: AC
  Administered 2023-12-29: 150 mg via INTRAVENOUS
  Filled 2023-12-29: qty 150

## 2023-12-29 MED ORDER — PALONOSETRON HCL INJECTION 0.25 MG/5ML
0.2500 mg | Freq: Once | INTRAVENOUS | Status: AC
  Administered 2023-12-29: 0.25 mg via INTRAVENOUS
  Filled 2023-12-29: qty 5

## 2023-12-29 MED ORDER — SODIUM CHLORIDE 0.9 % IV SOLN
605.5000 mg | Freq: Once | INTRAVENOUS | Status: AC
  Administered 2023-12-29: 610 mg via INTRAVENOUS
  Filled 2023-12-29: qty 61

## 2023-12-29 NOTE — Patient Instructions (Signed)
[facility] - A DEPT OF [facility]  Discharge Instructions: Thank you for choosing [facility] to provide your oncology and hematology care.   If you have a lab appointment with the Cancer Center, please go directly to the Cancer Center and check in at the registration area.   Wear comfortable clothing and clothing appropriate for easy access to any Portacath or PICC line.   We strive to give you quality time with your provider. You may need to reschedule your appointment if you arrive late (15 or more minutes).  Arriving late affects you and other patients whose appointments are after yours.  Also, if you miss three or more appointments without notifying the office, you may be dismissed from the clinic at the provider's discretion.      For prescription refill requests, have your pharmacy contact our office and allow 72 hours for refills to be completed.    Today you received the following chemotherapy and/or immunotherapy agents Nivolumab, Alimta, Carboplatin      To help prevent nausea and vomiting after your treatment, we encourage you to take your nausea medication as directed.  BELOW ARE SYMPTOMS THAT SHOULD BE REPORTED IMMEDIATELY: *FEVER GREATER THAN 100.4 F (38 C) OR HIGHER *CHILLS OR SWEATING *NAUSEA AND VOMITING THAT IS NOT CONTROLLED WITH YOUR NAUSEA MEDICATION *UNUSUAL SHORTNESS OF BREATH *UNUSUAL BRUISING OR BLEEDING *URINARY PROBLEMS (pain or burning when urinating, or frequent urination) *BOWEL PROBLEMS (unusual diarrhea, constipation, pain near the anus) TENDERNESS IN MOUTH AND THROAT WITH OR WITHOUT PRESENCE OF ULCERS (sore throat, sores in mouth, or a toothache) UNUSUAL RASH, SWELLING OR PAIN  UNUSUAL VAGINAL DISCHARGE OR ITCHING   Items with * indicate a potential emergency and should be followed up as soon as possible or go to the Emergency Department if any problems should occur.  Please show the CHEMOTHERAPY ALERT  CARD or IMMUNOTHERAPY ALERT CARD at check-in to the Emergency Department and triage nurse.  Should you have questions after your visit or need to cancel or reschedule your appointment, please contact [facility] - A DEPT OF [facility]  Dept: [phone]  and follow the prompts.  Office hours are 8:00 a.m. to 4:30 p.m. Monday - Friday. Please note that voicemails left after 4:00 p.m. may not be returned until the following business day.  We are closed weekends and major holidays. You have access to a nurse at all times for urgent questions. Please call the main number to the clinic Dept: [phone] and follow the prompts.   For any non-urgent questions, you may also contact your provider using MyChart. We now offer e-Visits for anyone 89 and older to request care online for non-urgent symptoms. For details visit [email or web address].   Also download the MyChart app! Go to the app store, search "MyChart", open the app, select [place], and log in with your MyChart username and password.  Nivolumab Injection What is this medication? NIVOLUMAB (nye VOL ue mab) treats some types of cancer. It works by helping your immune system slow or stop the spread of cancer cells. It is a monoclonal antibody. This medicine may be used for other purposes; ask your health care provider or pharmacist if you have questions. COMMON BRAND NAME(S): Opdivo What should I tell my care team before I take this medication? They need to know if you have any of these conditions: Allogeneic stem cell transplant (uses someone else's stem  cells) Autoimmune diseases, such as Crohn disease, ulcerative colitis, lupus History of chest radiation Nervous system problems, such as Guillain-Barre syndrome or myasthenia gravis Organ transplant An unusual or allergic reaction to nivolumab, other medications, foods, dyes, or preservatives Pregnant or trying to get pregnant Breast-feeding How should  I use this medication? This medication is infused into a vein. It is given in a hospital or clinic setting. A special MedGuide will be given to you before each treatment. Be sure to read this information carefully each time. Talk to your care team about the use of this medication in children. While it may be prescribed for children as young as 12 years for selected conditions, precautions do apply. Overdosage: If you think you have taken too much of this medicine contact a poison control center or emergency room at once. NOTE: This medicine is only for you. Do not share this medicine with others. What if I miss a dose? Keep appointments for follow-up doses. It is important not to miss your dose. Call your care team if you are unable to keep an appointment. What may interact with this medication? Interactions have not been studied. This list may not describe all possible interactions. Give your health care provider a list of all the medicines, herbs, non-prescription drugs, or dietary supplements you use. Also tell them if you smoke, drink alcohol, or use illegal drugs. Some items may interact with your medicine. What should I watch for while using this medication? Your condition will be monitored carefully while you are receiving this medication. You may need blood work while taking this medication. This medication may cause serious skin reactions. They can happen weeks to months after starting the medication. Contact your care team right away if you notice fevers or flu-like symptoms with a rash. The rash may be red or purple and then turn into blisters or peeling of the skin. You may also notice a red rash with swelling of the face, lips, or lymph nodes in your neck or under your arms. Tell your care team right away if you have any change in your eyesight. Talk to your care team if you are pregnant or think you might be pregnant. A negative pregnancy test is required before starting this medication.  A reliable form of contraception is recommended while taking this medication and for 5 months after the last dose. Talk to your care team about effective forms of contraception. Do not breast-feed while taking this medication and for 5 months after the last dose. What side effects may I notice from receiving this medication? Side effects that you should report to your care team as soon as possible: Allergic reactions--skin rash, itching, hives, swelling of the face, lips, tongue, or throat Dry cough, shortness of breath or trouble breathing Eye pain, redness, irritation, or discharge with blurry or decreased vision Heart muscle inflammation--unusual weakness or fatigue, shortness of breath, chest pain, fast or irregular heartbeat, dizziness, swelling of the ankles, feet, or hands Hormone gland problems--headache, sensitivity to light, unusual weakness or fatigue, dizziness, fast or irregular heartbeat, increased sensitivity to cold or heat, excessive sweating, constipation, hair loss, increased thirst or amount of urine, tremors or shaking, irritability Infusion reactions--chest pain, shortness of breath or trouble breathing, feeling faint or lightheaded Kidney injury (glomerulonephritis)--decrease in the amount of urine, red or dark brown urine, foamy or bubbly urine, swelling of the ankles, hands, or feet Liver injury--right upper belly pain, loss of appetite, nausea, light-colored stool, dark yellow or brown urine, yellowing  skin or eyes, unusual weakness or fatigue Pain, tingling, or numbness in the hands or feet, muscle weakness, change in vision, confusion or trouble speaking, loss of balance or coordination, trouble walking, seizures Rash, fever, and swollen lymph nodes Redness, blistering, peeling, or loosening of the skin, including inside the mouth Sudden or severe stomach pain, bloody diarrhea, fever, nausea, vomiting Side effects that usually do not require medical attention (report these  to your care team if they continue or are bothersome): Bone, joint, or muscle pain Diarrhea Fatigue Loss of appetite Nausea Skin rash This list may not describe all possible side effects. Call your doctor for medical advice about side effects. You may report side effects to FDA at 1-800-FDA-1088. Where should I keep my medication? This medication is given in a hospital or clinic. It will not be stored at home. NOTE: This sheet is a summary. It may not cover all possible information. If you have questions about this medicine, talk to your doctor, pharmacist, or health care provider.  2024 Elsevier/Gold Standard (2022-03-02 00:00:00) Pemetrexed Injection What is this medication? PEMETREXED (PEM e TREX ed) treats some types of cancer. It works by slowing down the growth of cancer cells. This medicine may be used for other purposes; ask your health care provider or pharmacist if you have questions. COMMON BRAND NAME(S): Alimta, PEMFEXY, PEMRYDI RTU What should I tell my care team before I take this medication? They need to know if you have any of these conditions: Infection, such as chickenpox, cold sores, or herpes Kidney disease Low blood cell levels (white cells, red cells, and platelets) Lung or breathing disease, such as asthma Radiation therapy An unusual or allergic reaction to pemetrexed, other medications, foods, dyes, or preservatives If you or your partner are pregnant or trying to get pregnant Breast-feeding How should I use this medication? This medication is injected into a vein. It is given by your care team in a hospital or clinic setting. Talk to your care team about the use of this medication in children. Special care may be needed. Overdosage: If you think you have taken too much of this medicine contact a poison control center or emergency room at once. NOTE: This medicine is only for you. Do not share this medicine with others. What if I miss a dose? Keep  appointments for follow-up doses. It is important not to miss your dose. Call your care team if you are unable to keep an appointment. What may interact with this medication? Do not take this medication with any of the following: Live virus vaccines This medication may also interact with the following: Ibuprofen This list may not describe all possible interactions. Give your health care provider a list of all the medicines, herbs, non-prescription drugs, or dietary supplements you use. Also tell them if you smoke, drink alcohol, or use illegal drugs. Some items may interact with your medicine. What should I watch for while using this medication? Your condition will be monitored carefully while you are receiving this medication. This medication may make you feel generally unwell. This is not uncommon as chemotherapy can affect healthy cells as well as cancer cells. Report any side effects. Continue your course of treatment even though you feel ill unless your care team tells you to stop. This medication can cause serious side effects. To reduce the risk, your care team may give you other medications to take before receiving this one. Be sure to follow the directions from your care team. This medication can  cause a rash or redness in areas of the body that have previously had radiation therapy. If you have had radiation therapy, tell your care team if you notice a rash in this area. This medication may increase your risk of getting an infection. Call your care team for advice if you get a fever, chills, sore throat, or other symptoms of a cold or flu. Do not treat yourself. Try to avoid being around people who are sick. Be careful brushing or flossing your teeth or using a toothpick because you may get an infection or bleed more easily. If you have any dental work done, tell your dentist you are receiving this medication. Avoid taking medications that contain aspirin, acetaminophen, ibuprofen, naproxen,  or ketoprofen unless instructed by your care team. These medications may hide a fever. Check with your care team if you have severe diarrhea, nausea, and vomiting, or if you sweat a lot. The loss of too much body fluid may make it dangerous for you to take this medication. Talk to your care team if you or your partner wish to become pregnant or think either of you might be pregnant. This medication can cause serious birth defects if taken during pregnancy and for 6 months after the last dose. A negative pregnancy test is required before starting this medication. A reliable form of contraception is recommended while taking this medication and for 6 months after the last dose. Talk to your care team about reliable forms of contraception. Do not father a child while taking this medication and for 3 months after the last dose. Use a condom while having sex during this time period. Do not breastfeed while taking this medication and for 1 week after the last dose. This medication may cause infertility. Talk to your care team if you are concerned about your fertility. What side effects may I notice from receiving this medication? Side effects that you should report to your care team as soon as possible: Allergic reactions--skin rash, itching, hives, swelling of the face, lips, tongue, or throat Dry cough, shortness of breath or trouble breathing Infection--fever, chills, cough, sore throat, wounds that don't heal, pain or trouble when passing urine, general feeling of discomfort or being unwell Kidney injury--decrease in the amount of urine, swelling of the ankles, hands, or feet Low red blood cell level--unusual weakness or fatigue, dizziness, headache, trouble breathing Redness, blistering, peeling, or loosening of the skin, including inside the mouth Unusual bruising or bleeding Side effects that usually do not require medical attention (report to your care team if they continue or are  bothersome): Fatigue Loss of appetite Nausea Vomiting This list may not describe all possible side effects. Call your doctor for medical advice about side effects. You may report side effects to FDA at 1-800-FDA-1088. Where should I keep my medication? This medication is given in a hospital or clinic. It will not be stored at home. NOTE: This sheet is a summary. It may not cover all possible information. If you have questions about this medicine, talk to your doctor, pharmacist, or health care provider.  2024 Elsevier/Gold Standard (2022-03-10 00:00:00)  Carboplatin Injection What is this medication? CARBOPLATIN (KAR boe pla tin) treats some types of cancer. It works by slowing down the growth of cancer cells. This medicine may be used for other purposes; ask your health care provider or pharmacist if you have questions. COMMON BRAND NAME(S): Paraplatin What should I tell my care team before I take this medication? They need to know  if you have any of these conditions: Blood disorders Hearing problems Kidney disease Recent or ongoing radiation therapy An unusual or allergic reaction to carboplatin, cisplatin, other medications, foods, dyes, or preservatives Pregnant or trying to get pregnant Breast-feeding How should I use this medication? This medication is injected into a vein. It is given by your care team in a hospital or clinic setting. Talk to your care team about the use of this medication in children. Special care may be needed. Overdosage: If you think you have taken too much of this medicine contact a poison control center or emergency room at once. NOTE: This medicine is only for you. Do not share this medicine with others. What if I miss a dose? Keep appointments for follow-up doses. It is important not to miss your dose. Call your care team if you are unable to keep an appointment. What may interact with this medication? Medications for seizures Some antibiotics, such  as amikacin, gentamicin, neomycin, streptomycin, tobramycin Vaccines This list may not describe all possible interactions. Give your health care provider a list of all the medicines, herbs, non-prescription drugs, or dietary supplements you use. Also tell them if you smoke, drink alcohol, or use illegal drugs. Some items may interact with your medicine. What should I watch for while using this medication? Your condition will be monitored carefully while you are receiving this medication. You may need blood work while taking this medication. This medication may make you feel generally unwell. This is not uncommon, as chemotherapy can affect healthy cells as well as cancer cells. Report any side effects. Continue your course of treatment even though you feel ill unless your care team tells you to stop. In some cases, you may be given additional medications to help with side effects. Follow all directions for their use. This medication may increase your risk of getting an infection. Call your care team for advice if you get a fever, chills, sore throat, or other symptoms of a cold or flu. Do not treat yourself. Try to avoid being around people who are sick. Avoid taking medications that contain aspirin, acetaminophen, ibuprofen, naproxen, or ketoprofen unless instructed by your care team. These medications may hide a fever. Be careful brushing or flossing your teeth or using a toothpick because you may get an infection or bleed more easily. If you have any dental work done, tell your dentist you are receiving this medication. Talk to your care team if you wish to become pregnant or think you might be pregnant. This medication can cause serious birth defects. Talk to your care team about effective forms of contraception. Do not breast-feed while taking this medication. What side effects may I notice from receiving this medication? Side effects that you should report to your care team as soon as  possible: Allergic reactions--skin rash, itching, hives, swelling of the face, lips, tongue, or throat Infection--fever, chills, cough, sore throat, wounds that don't heal, pain or trouble when passing urine, general feeling of discomfort or being unwell Low red blood cell level--unusual weakness or fatigue, dizziness, headache, trouble breathing Pain, tingling, or numbness in the hands or feet, muscle weakness, change in vision, confusion or trouble speaking, loss of balance or coordination, trouble walking, seizures Unusual bruising or bleeding Side effects that usually do not require medical attention (report to your care team if they continue or are bothersome): Hair loss Nausea Unusual weakness or fatigue Vomiting This list may not describe all possible side effects. Call your doctor for medical  advice about side effects. You may report side effects to FDA at 1-800-FDA-1088. Where should I keep my medication? This medication is given in a hospital or clinic. It will not be stored at home. NOTE: This sheet is a summary. It may not cover all possible information. If you have questions about this medicine, talk to your doctor, pharmacist, or health care provider.  2024 Elsevier/Gold Standard (2022-02-24 00:00:00)

## 2023-12-30 ENCOUNTER — Telehealth: Payer: Self-pay

## 2023-12-30 NOTE — Telephone Encounter (Signed)
-----   Message from Nurse Kasandra Knudsen sent at 12/29/2023 11:46 AM EST ----- Regarding: First time Carbo/Alimta/Nivol Dr. Arbutus Ped- first time did well

## 2023-12-30 NOTE — Telephone Encounter (Signed)
Daughter Jackson Powers states that her [name] is doing fine. He is eating, drinking, and urinating well. They know to call the office at [phone] if they have any questions or concerns.

## 2024-01-01 ENCOUNTER — Other Ambulatory Visit: Payer: Self-pay

## 2024-01-05 ENCOUNTER — Ambulatory Visit: Payer: BC Managed Care – PPO | Admitting: Pulmonary Disease

## 2024-01-06 LAB — GUARDANT 360

## 2024-01-06 NOTE — Progress Notes (Signed)
 FMLA Forms for Spouse completed as requested. No other needs or concerns noted at this time.

## 2024-01-08 ENCOUNTER — Other Ambulatory Visit: Payer: Self-pay

## 2024-01-08 NOTE — Progress Notes (Signed)
 Updated pemetrexed ERX to [id number]   Pryor Ochoa, PharmD 01/08/24

## 2024-01-18 NOTE — Progress Notes (Unsigned)
 [facility] OFFICE PROGRESS NOTE  Jackson Peak, PA-C [address] [place] [address]  DIAGNOSIS: Stage IIIA (T3, N2, M0) non-small cell lung cancer, favoring adenocarcinoma presented with left hilar soft tissue mass in addition to left hilar and mediastinal lymphadenopathy diagnosed in January 2025.   Molecular Studies by Guardant 360: Negative for actionable mutations  PRIOR THERAPY: None  CURRENT THERAPY: Neoadjuvant systemic chemotherapy with carboplatin for AUC of 5, Alimta 500 Mg/M2 and nivolumab 360 Mg IV every 3 weeks.  First dose December 30, 2023. Status post 1 cycle.  INTERVAL HISTORY: Jackson Powers 78 y.o. male returns to the clinic today for follow-up visit accompanied by ***.  The patient was last seen in the clinic by Dr. Arbutus Ped on 12/23/2023.  The patient is currently undergoing neoadjuvant hemotherapy and immunotherapy.  The plan is to undergo 3 cycles of neoadjuvant treatment and then reassess with imaging.  After his repeat imaging, we will determine if the patient could be considered for surgical resection.  If not, then we would likely recommend concurrent chemoradiation.  ***no weekly labs ***  The patient underwent his first cycle of systemic chemotherapy and immunotherapy on 12/30/2023 and he tolerated it ***without any adverse side effects except for ***. Overall, the patient is feeling "***" today.  He denies any fever, chills, night sweats, or unexplained weight loss.  His breathing is "***".  The patient may have dyspnea on exertion if he uses really exerting himself but overall he denies any significant dyspnea.  He reports that he is not cough very much.  He may get some cough with phlegm which is not new.  He uses Mucinex.  He denies any hemoptysis.  He denies any nausea, vomiting, diarrhea, or constipation.  Denies any headache or visual changes.  He is here today for evaluation repeat blood work before undergoing cycle #2.  MEDICAL HISTORY: Past  Medical History:  Diagnosis Date   Acute gastritis without hemorrhage 2007   Arthritis    osteoarthritis    Benign neoplasm of colon 2007   COPD (chronic obstructive pulmonary disease) (HCC)    Esophagitis 2007   Hypertension    Orthopnea    reports " i have trouble breathing when i lie completely flat. i have to use 2 people to prop my head up"    Panic anxiety syndrome    patient reports having severe panic anxiety and claustraphobia. states with ear surgery years ago , they had to tie him down which caused him to panic even more;    Right BBB/left ant fasc block    (OLD)   TIA (transient ischemic attack)     ALLERGIES:  has no known allergies.  MEDICATIONS:  Current Outpatient Medications  Medication Sig Dispense Refill   amLODipine (NORVASC) 10 MG tablet Take 10 mg by mouth daily.     clopidogrel (PLAVIX) 75 MG tablet Take 75 mg by mouth daily.     fluticasone (FLONASE) 50 MCG/ACT nasal spray Place 1 spray into both nostrils daily. 16 g 2   Fluticasone-Umeclidin-Vilant (TRELEGY ELLIPTA) 100-62.5-25 MCG/ACT AEPB TAKE 1 PUFF BY MOUTH EVERY DAY (Patient not taking: Reported on 12/14/2023) 60 each 3   folic acid (FOLVITE) 1 MG tablet Take 1 tablet (1 mg total) by mouth daily. Start 7 days before pemetrexed chemotherapy. Continue until 21 days after pemetrexed completed. 100 tablet 3   metoprolol succinate (TOPROL-XL) 25 MG 24 hr tablet Take 25 mg by mouth daily.     ondansetron (  ZOFRAN) 8 MG tablet Take 1 tablet (8 mg total) by mouth every 8 (eight) hours as needed for nausea or vomiting. Start on the third day after carboplatin. 30 tablet 1   prochlorperazine (COMPAZINE) 10 MG tablet Take 1 tablet (10 mg total) by mouth every 6 (six) hours as needed for nausea or vomiting. 30 tablet 1   No current facility-administered medications for this visit.   Facility-Administered Medications Ordered in Other Visits  Medication Dose Route Frequency Provider Last Rate Last Admin   sodium  chloride flush (NS) 0.9 % injection 3 mL  3 mL Intravenous Q12H Laurier Nancy, MD        SURGICAL HISTORY:  Past Surgical History:  Procedure Laterality Date   BRONCHIAL BIOPSY  12/02/2023   Procedure: BRONCHIAL BIOPSIES;  Surgeon: Raechel Chute, MD;  Location: [facility];  Service: Pulmonary;;   BRONCHIAL BRUSHINGS  12/02/2023   Procedure: BRONCHIAL BRUSHINGS;  Surgeon: Raechel Chute, MD;  Location: [facility];  Service: Pulmonary;;   BRONCHIAL NEEDLE ASPIRATION BIOPSY  12/02/2023   Procedure: BRONCHIAL NEEDLE ASPIRATION BIOPSIES;  Surgeon: Raechel Chute, MD;  Location: [facility];  Service: Pulmonary;;   BRONCHIAL WASHINGS  12/02/2023   Procedure: BRONCHIAL WASHINGS;  Surgeon: Raechel Chute, MD;  Location: [facility];  Service: Pulmonary;;   LEFT HEART CATH AND CORONARY ANGIOGRAPHY Left 10/01/2020   Procedure: LEFT HEART CATH AND CORONARY ANGIOGRAPHY;  Surgeon: Laurier Nancy, MD;  Location: [facility];  Service: Cardiovascular;  Laterality: Left;   SKIN CANCER EXCISION Right 2017   EAR; FAILED GRAFT FOLLWOING SURGERY    TOTAL HIP ARTHROPLASTY Right 07/20/2018   Procedure: RIGHT TOTAL HIP ARTHROPLASTY ANTERIOR APPROACH;  Surgeon: Ollen Gross, MD;  Location: [facility];  Service: Orthopedics;  Laterality: Right;   VIDEO BRONCHOSCOPY WITH ENDOBRONCHIAL ULTRASOUND  12/02/2023   Procedure: VIDEO BRONCHOSCOPY WITH ENDOBRONCHIAL ULTRASOUND;  Surgeon: Raechel Chute, MD;  Location: [facility];  Service: Pulmonary;;    REVIEW OF SYSTEMS:   Review of Systems  Constitutional: Negative for appetite change, chills, fatigue, fever and unexpected weight change.  HENT:   Negative for mouth sores, nosebleeds, sore throat and trouble swallowing.   Eyes: Negative for eye problems and icterus.  Respiratory: Negative for cough, hemoptysis, shortness of breath and wheezing.   Cardiovascular: Negative for chest pain and leg swelling.  Gastrointestinal: Negative for abdominal pain,  constipation, diarrhea, nausea and vomiting.  Genitourinary: Negative for bladder incontinence, difficulty urinating, dysuria, frequency and hematuria.   Musculoskeletal: Negative for back pain, gait problem, neck pain and neck stiffness.  Skin: Negative for itching and rash.  Neurological: Negative for dizziness, extremity weakness, gait problem, headaches, light-headedness and seizures.  Hematological: Negative for adenopathy. Does not bruise/bleed easily.  Psychiatric/Behavioral: Negative for confusion, depression and sleep disturbance. The patient is not nervous/anxious.     PHYSICAL EXAMINATION:  There were no vitals taken for this visit.  ECOG PERFORMANCE STATUS: {CHL ONC ECOG [id number]  Physical Exam  Constitutional: Oriented to person, place, and time and well-developed, well-nourished, and in no distress. No distress.  HENT:  Head: Normocephalic and atraumatic.  Mouth/Throat: Oropharynx is clear and moist. No oropharyngeal exudate.  Eyes: Conjunctivae are normal. Right eye exhibits no discharge. Left eye exhibits no discharge. No scleral icterus.  Neck: Normal range of motion. Neck supple.  Cardiovascular: Normal rate, regular rhythm, normal heart sounds and intact distal pulses.   Pulmonary/Chest: Effort normal and breath sounds normal. No respiratory distress. No wheezes. No rales.  Abdominal: Soft.  Bowel sounds are normal. Exhibits no distension and no mass. There is no tenderness.  Musculoskeletal: Normal range of motion. Exhibits no edema.  Lymphadenopathy:    No cervical adenopathy.  Neurological: Alert and oriented to person, place, and time. Exhibits normal muscle tone. Gait normal. Coordination normal.  Skin: Skin is warm and dry. No rash noted. Not diaphoretic. No erythema. No pallor.  Psychiatric: Mood, memory and judgment normal.  Vitals reviewed.  LABORATORY DATA: Lab Results  Component Value Date   WBC 7.2 12/29/2023   HGB 15.3 12/29/2023   HCT 45.8  12/29/2023   MCV 88.8 12/29/2023   PLT 279 12/29/2023      Chemistry      Component Value Date/Time   NA 140 12/29/2023 0807   K 4.1 12/29/2023 0807   CL 107 12/29/2023 0807   CO2 27 12/29/2023 0807   BUN 18 12/29/2023 0807   CREATININE 0.88 12/29/2023 0807      Component Value Date/Time   CALCIUM 9.6 12/29/2023 0807   ALKPHOS 51 12/29/2023 0807   AST 19 12/29/2023 0807   ALT 24 12/29/2023 0807   BILITOT 0.5 12/29/2023 1610       RADIOGRAPHIC STUDIES:  MR BRAIN W WO CONTRAST Result Date: 12/22/2023 CLINICAL DATA:  Provided history: Malignant neoplasm of left lung, unspecified part of lung. Metastatic disease evaluation. EXAM: MRI HEAD WITHOUT AND WITH CONTRAST TECHNIQUE: Multiplanar, multiecho pulse sequences of the brain and surrounding structures were obtained without and with intravenous contrast. CONTRAST:  10mL GADAVIST GADOBUTROL 1 MMOL/ML IV SOLN COMPARISON:  MRI brain and MRA head 12/17/2020. FINDINGS: Brain: No age-advanced or lobar predominant parenchymal atrophy. Chronic lacunar infarct within the right lentiform nucleus/retrolenticular white matter. Mild multifocal T2 FLAIR hyperintense signal abnormality elsewhere within the cerebral white matter, nonspecific but compatible with chronic small vessel ischemic disease. There is no acute infarct. No evidence of an intracranial mass. No chronic intracranial blood products. No extra-axial fluid collection. No midline shift. No pathologic intracranial enhancement identified. 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 Flow voids preserved elsewhere within the proximal large arterial vessels. Skull and upper cervical spine: No focal worrisome marrow lesion. Sinuses/Orbits: No mass or acute finding within the imaged orbits. Moderate mucosal thickening within the right maxillary sinus. Mild mucosal thickening within the left maxillary, bilateral ethmoid and bilateral frontal sinuses. IMPRESSION: 1. No  evidence of intracranial metastatic disease. 2. Chronic lacunar infarct within the right lentiform nucleus/retrolenticular white matter, unchanged from the prior brain MRI of 12/17/2020. 3. Background mild cerebral white matter chronic small vessel disease. 4. 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 5. Paranasal sinus disease as described. Electronically Signed   By: Jackey Loge D.O.   On: 12/22/2023 16:28     ASSESSMENT/PLAN:  This is a very pleasant 78 years old white male with Stage IIIA (T3, N2, M0) non-small cell lung cancer, favoring adenocarcinoma presented with left hilar soft tissue mass in addition to left hilar and mediastinal lymphadenopathy diagnosed in January 2025. There was insufficient material for foundation one testing. Guardant 360 was negative for any actionable mutations.   The patient's case was discussed at the multidisciplinary conference.  Options of neoadjuvant chemoimmunotherapy to shrink the tumor before resection was discussed.  Therefore the patient is currently on neoadjuvant treatment with carboplatin for AUC of 5, Alimta 500 mg/m, and nivolumab IV every 3 weeks for 3 doses.  He is status post 2 weeks of treatment.  After cycle #3, we  will arrange for repeat imaging studies and determine if he is a candidate for surgery.  If he is not a candidate for surgery, we can discuss concurrent chemoradiation.  The patient is status post his first cycle of chemotherapy and immunotherapy and he tolerated it ***.   Labs were reviewed.  Recommend that he ***cycle #2 today as scheduled.  We will see him back for follow-up visit in 3 weeks for evaluation repeat blood work before undergoing cycle #3.  The patient was advised to call immediately if she has any concerning symptoms in the interval. The patient voices understanding of current disease status and treatment options and is in agreement with the current care plan. All questions were answered.  The patient knows to call the clinic with any problems, questions or concerns. We can certainly see the patient much sooner if necessary    No orders of the defined types were placed in this encounter.    I spent {CHL ONC TIME VISIT - [id number]} counseling the patient face to face. The total time spent in the appointment was {CHL ONC TIME VISIT - MWUXL:[id number]}.  Wyat Infinger L Cigi Bega, PA-C 01/18/24

## 2024-01-19 MED FILL — Fosaprepitant Dimeglumine For IV Infusion 150 MG (Base Eq): INTRAVENOUS | Qty: 5 | Status: AC

## 2024-01-20 ENCOUNTER — Inpatient Hospital Stay: Payer: BC Managed Care – PPO | Admitting: Physician Assistant

## 2024-01-20 ENCOUNTER — Other Ambulatory Visit: Payer: Self-pay | Admitting: Physician Assistant

## 2024-01-20 ENCOUNTER — Inpatient Hospital Stay: Payer: BC Managed Care – PPO

## 2024-01-20 ENCOUNTER — Inpatient Hospital Stay: Payer: BC Managed Care – PPO | Attending: Physician Assistant

## 2024-01-20 ENCOUNTER — Inpatient Hospital Stay: Admitting: Physician Assistant

## 2024-01-20 ENCOUNTER — Encounter: Payer: Self-pay | Admitting: Internal Medicine

## 2024-01-20 VITALS — BP 138/83 | HR 77 | Resp 18

## 2024-01-20 VITALS — BP 133/94 | HR 80 | Temp 98.2°F | Resp 16

## 2024-01-20 VITALS — BP 151/81 | HR 75 | Temp 98.7°F | Resp 16 | Wt 243.2 lb

## 2024-01-20 DIAGNOSIS — Z5111 Encounter for antineoplastic chemotherapy: Secondary | ICD-10-CM | POA: Insufficient documentation

## 2024-01-20 DIAGNOSIS — C3412 Malignant neoplasm of upper lobe, left bronchus or lung: Secondary | ICD-10-CM | POA: Diagnosis not present

## 2024-01-20 DIAGNOSIS — T50905A Adverse effect of unspecified drugs, medicaments and biological substances, initial encounter: Secondary | ICD-10-CM

## 2024-01-20 DIAGNOSIS — Z79899 Other long term (current) drug therapy: Secondary | ICD-10-CM | POA: Insufficient documentation

## 2024-01-20 LAB — CBC WITH DIFFERENTIAL (CANCER CENTER ONLY)
Abs Immature Granulocytes: 0.01 10*3/uL (ref 0.00–0.07)
Basophils Absolute: 0 10*3/uL (ref 0.0–0.1)
Basophils Relative: 0 %
Eosinophils Absolute: 0.1 10*3/uL (ref 0.0–0.5)
Eosinophils Relative: 1 %
HCT: 43.1 % (ref 39.0–52.0)
Hemoglobin: 14.6 g/dL (ref 13.0–17.0)
Immature Granulocytes: 0 %
Lymphocytes Relative: 54 %
Lymphs Abs: 3 10*3/uL (ref 0.7–4.0)
MCH: 29.9 pg (ref 26.0–34.0)
MCHC: 33.9 g/dL (ref 30.0–36.0)
MCV: 88.1 fL (ref 80.0–100.0)
Monocytes Absolute: 0.7 10*3/uL (ref 0.1–1.0)
Monocytes Relative: 12 %
Neutro Abs: 1.8 10*3/uL (ref 1.7–7.7)
Neutrophils Relative %: 33 %
Platelet Count: 299 10*3/uL (ref 150–400)
RBC: 4.89 MIL/uL (ref 4.22–5.81)
RDW: 14.1 % (ref 11.5–15.5)
WBC Count: 5.5 10*3/uL (ref 4.0–10.5)
nRBC: 0 % (ref 0.0–0.2)

## 2024-01-20 LAB — CMP (CANCER CENTER ONLY)
ALT: 32 U/L (ref 0–44)
AST: 22 U/L (ref 15–41)
Albumin: 4.2 g/dL (ref 3.5–5.0)
Alkaline Phosphatase: 48 U/L (ref 38–126)
Anion gap: 4 — ABNORMAL LOW (ref 5–15)
BUN: 16 mg/dL (ref 8–23)
CO2: 28 mmol/L (ref 22–32)
Calcium: 9.4 mg/dL (ref 8.9–10.3)
Chloride: 107 mmol/L (ref 98–111)
Creatinine: 0.86 mg/dL (ref 0.61–1.24)
GFR, Estimated: >60 mL/min (ref >60)
Glucose, Bld: 91 mg/dL (ref 70–99)
Potassium: 4.2 mmol/L (ref 3.5–5.1)
Sodium: 139 mmol/L (ref 135–145)
Total Bilirubin: 0.4 mg/dL (ref 0.0–1.2)
Total Protein: 7.1 g/dL (ref 6.5–8.1)

## 2024-01-20 LAB — TSH: TSH: 1.341 u[IU]/mL (ref 0.350–4.500)

## 2024-01-20 MED ORDER — SODIUM CHLORIDE 0.9 % IV SOLN: Freq: Once | INTRAVENOUS | Status: DC | PRN

## 2024-01-20 MED ORDER — SODIUM CHLORIDE 0.9 % IV SOLN
360.0000 mg | Freq: Once | INTRAVENOUS | Status: AC
Start: 2024-01-20 — End: 2024-01-20
  Administered 2024-01-20: 360 mg via INTRAVENOUS
  Filled 2024-01-20: qty 12

## 2024-01-20 MED ORDER — SODIUM CHLORIDE 0.9 % IV SOLN
500.0000 mg/m2 | Freq: Once | INTRAVENOUS | Status: AC
  Administered 2024-01-20: 1200 mg via INTRAVENOUS
  Filled 2024-01-20: qty 40

## 2024-01-20 MED ORDER — SODIUM CHLORIDE 0.9 % IV SOLN
150.0000 mg | Freq: Once | INTRAVENOUS | Status: AC
  Administered 2024-01-20: 150 mg via INTRAVENOUS
  Filled 2024-01-20: qty 150

## 2024-01-20 MED ORDER — SODIUM CHLORIDE 0.9 % IV SOLN: INTRAVENOUS | Status: DC

## 2024-01-20 MED ORDER — SODIUM CHLORIDE 0.9 % IV SOLN
605.5000 mg | Freq: Once | INTRAVENOUS | Status: AC
  Administered 2024-01-20: 610 mg via INTRAVENOUS
  Filled 2024-01-20: qty 61

## 2024-01-20 MED ORDER — FAMOTIDINE IN NACL 20-0.9 MG/50ML-% IV SOLN
20.0000 mg | Freq: Once | INTRAVENOUS | Status: AC | PRN
  Administered 2024-01-20: 20 mg via INTRAVENOUS

## 2024-01-20 MED ORDER — PALONOSETRON HCL INJECTION 0.25 MG/5ML
0.2500 mg | Freq: Once | INTRAVENOUS | Status: AC
  Administered 2024-01-20: 0.25 mg via INTRAVENOUS
  Filled 2024-01-20: qty 5

## 2024-01-20 MED ORDER — DEXAMETHASONE SODIUM PHOSPHATE 10 MG/ML IJ SOLN
10.0000 mg | Freq: Once | INTRAMUSCULAR | Status: AC
  Administered 2024-01-20: 10 mg via INTRAVENOUS
  Filled 2024-01-20: qty 1

## 2024-01-20 NOTE — Patient Instructions (Signed)
 [facility] - A DEPT OF [facility]  Discharge Instructions: Thank you for choosing [facility] to provide your oncology and hematology care.   If you have a lab appointment with the Cancer Center, please go directly to the Cancer Center and check in at the registration area.   Wear comfortable clothing and clothing appropriate for easy access to any Portacath or PICC line.   We strive to give you quality time with your provider. You may need to reschedule your appointment if you arrive late (15 or more minutes).  Arriving late affects you and other patients whose appointments are after yours.  Also, if you miss three or more appointments without notifying the office, you may be dismissed from the clinic at the provider's discretion.      For prescription refill requests, have your pharmacy contact our office and allow 72 hours for refills to be completed.    Today you received the following chemotherapy and/or immunotherapy agents Nivolumab, Alimta, Carboplatin      To help prevent nausea and vomiting after your treatment, we encourage you to take your nausea medication as directed.  BELOW ARE SYMPTOMS THAT SHOULD BE REPORTED IMMEDIATELY: *FEVER GREATER THAN 100.4 F (38 C) OR HIGHER *CHILLS OR SWEATING *NAUSEA AND VOMITING THAT IS NOT CONTROLLED WITH YOUR NAUSEA MEDICATION *UNUSUAL SHORTNESS OF BREATH *UNUSUAL BRUISING OR BLEEDING *URINARY PROBLEMS (pain or burning when urinating, or frequent urination) *BOWEL PROBLEMS (unusual diarrhea, constipation, pain near the anus) TENDERNESS IN MOUTH AND THROAT WITH OR WITHOUT PRESENCE OF ULCERS (sore throat, sores in mouth, or a toothache) UNUSUAL RASH, SWELLING OR PAIN  UNUSUAL VAGINAL DISCHARGE OR ITCHING   Items with * indicate a potential emergency and should be followed up as soon as possible or go to the Emergency Department if any problems should occur.  Please show the CHEMOTHERAPY ALERT  CARD or IMMUNOTHERAPY ALERT CARD at check-in to the Emergency Department and triage nurse.  Should you have questions after your visit or need to cancel or reschedule your appointment, please contact [facility] - A DEPT OF [facility]  Dept: [phone]  and follow the prompts.  Office hours are 8:00 a.m. to 4:30 p.m. Monday - Friday. Please note that voicemails left after 4:00 p.m. may not be returned until the following business day.  We are closed weekends and major holidays. You have access to a nurse at all times for urgent questions. Please call the main number to the clinic Dept: [phone] and follow the prompts.   For any non-urgent questions, you may also contact your provider using MyChart. We now offer e-Visits for anyone 89 and older to request care online for non-urgent symptoms. For details visit [email or web address].   Also download the MyChart app! Go to the app store, search "MyChart", open the app, select [place], and log in with your MyChart username and password.  Nivolumab Injection What is this medication? NIVOLUMAB (nye VOL ue mab) treats some types of cancer. It works by helping your immune system slow or stop the spread of cancer cells. It is a monoclonal antibody. This medicine may be used for other purposes; ask your health care provider or pharmacist if you have questions. COMMON BRAND NAME(S): Opdivo What should I tell my care team before I take this medication? They need to know if you have any of these conditions: Allogeneic stem cell transplant (uses someone else's stem  cells) Autoimmune diseases, such as Crohn disease, ulcerative colitis, lupus History of chest radiation Nervous system problems, such as Guillain-Barre syndrome or myasthenia gravis Organ transplant An unusual or allergic reaction to nivolumab, other medications, foods, dyes, or preservatives Pregnant or trying to get pregnant Breast-feeding How should  I use this medication? This medication is infused into a vein. It is given in a hospital or clinic setting. A special MedGuide will be given to you before each treatment. Be sure to read this information carefully each time. Talk to your care team about the use of this medication in children. While it may be prescribed for children as young as 12 years for selected conditions, precautions do apply. Overdosage: If you think you have taken too much of this medicine contact a poison control center or emergency room at once. NOTE: This medicine is only for you. Do not share this medicine with others. What if I miss a dose? Keep appointments for follow-up doses. It is important not to miss your dose. Call your care team if you are unable to keep an appointment. What may interact with this medication? Interactions have not been studied. This list may not describe all possible interactions. Give your health care provider a list of all the medicines, herbs, non-prescription drugs, or dietary supplements you use. Also tell them if you smoke, drink alcohol, or use illegal drugs. Some items may interact with your medicine. What should I watch for while using this medication? Your condition will be monitored carefully while you are receiving this medication. You may need blood work while taking this medication. This medication may cause serious skin reactions. They can happen weeks to months after starting the medication. Contact your care team right away if you notice fevers or flu-like symptoms with a rash. The rash may be red or purple and then turn into blisters or peeling of the skin. You may also notice a red rash with swelling of the face, lips, or lymph nodes in your neck or under your arms. Tell your care team right away if you have any change in your eyesight. Talk to your care team if you are pregnant or think you might be pregnant. A negative pregnancy test is required before starting this medication.  A reliable form of contraception is recommended while taking this medication and for 5 months after the last dose. Talk to your care team about effective forms of contraception. Do not breast-feed while taking this medication and for 5 months after the last dose. What side effects may I notice from receiving this medication? Side effects that you should report to your care team as soon as possible: Allergic reactions--skin rash, itching, hives, swelling of the face, lips, tongue, or throat Dry cough, shortness of breath or trouble breathing Eye pain, redness, irritation, or discharge with blurry or decreased vision Heart muscle inflammation--unusual weakness or fatigue, shortness of breath, chest pain, fast or irregular heartbeat, dizziness, swelling of the ankles, feet, or hands Hormone gland problems--headache, sensitivity to light, unusual weakness or fatigue, dizziness, fast or irregular heartbeat, increased sensitivity to cold or heat, excessive sweating, constipation, hair loss, increased thirst or amount of urine, tremors or shaking, irritability Infusion reactions--chest pain, shortness of breath or trouble breathing, feeling faint or lightheaded Kidney injury (glomerulonephritis)--decrease in the amount of urine, red or dark brown urine, foamy or bubbly urine, swelling of the ankles, hands, or feet Liver injury--right upper belly pain, loss of appetite, nausea, light-colored stool, dark yellow or brown urine, yellowing  skin or eyes, unusual weakness or fatigue Pain, tingling, or numbness in the hands or feet, muscle weakness, change in vision, confusion or trouble speaking, loss of balance or coordination, trouble walking, seizures Rash, fever, and swollen lymph nodes Redness, blistering, peeling, or loosening of the skin, including inside the mouth Sudden or severe stomach pain, bloody diarrhea, fever, nausea, vomiting Side effects that usually do not require medical attention (report these  to your care team if they continue or are bothersome): Bone, joint, or muscle pain Diarrhea Fatigue Loss of appetite Nausea Skin rash This list may not describe all possible side effects. Call your doctor for medical advice about side effects. You may report side effects to FDA at 1-800-FDA-1088. Where should I keep my medication? This medication is given in a hospital or clinic. It will not be stored at home. NOTE: This sheet is a summary. It may not cover all possible information. If you have questions about this medicine, talk to your doctor, pharmacist, or health care provider.  2024 Elsevier/Gold Standard (2022-03-02 00:00:00) Pemetrexed Injection What is this medication? PEMETREXED (PEM e TREX ed) treats some types of cancer. It works by slowing down the growth of cancer cells. This medicine may be used for other purposes; ask your health care provider or pharmacist if you have questions. COMMON BRAND NAME(S): Alimta, PEMFEXY, PEMRYDI RTU What should I tell my care team before I take this medication? They need to know if you have any of these conditions: Infection, such as chickenpox, cold sores, or herpes Kidney disease Low blood cell levels (white cells, red cells, and platelets) Lung or breathing disease, such as asthma Radiation therapy An unusual or allergic reaction to pemetrexed, other medications, foods, dyes, or preservatives If you or your partner are pregnant or trying to get pregnant Breast-feeding How should I use this medication? This medication is injected into a vein. It is given by your care team in a hospital or clinic setting. Talk to your care team about the use of this medication in children. Special care may be needed. Overdosage: If you think you have taken too much of this medicine contact a poison control center or emergency room at once. NOTE: This medicine is only for you. Do not share this medicine with others. What if I miss a dose? Keep  appointments for follow-up doses. It is important not to miss your dose. Call your care team if you are unable to keep an appointment. What may interact with this medication? Do not take this medication with any of the following: Live virus vaccines This medication may also interact with the following: Ibuprofen This list may not describe all possible interactions. Give your health care provider a list of all the medicines, herbs, non-prescription drugs, or dietary supplements you use. Also tell them if you smoke, drink alcohol, or use illegal drugs. Some items may interact with your medicine. What should I watch for while using this medication? Your condition will be monitored carefully while you are receiving this medication. This medication may make you feel generally unwell. This is not uncommon as chemotherapy can affect healthy cells as well as cancer cells. Report any side effects. Continue your course of treatment even though you feel ill unless your care team tells you to stop. This medication can cause serious side effects. To reduce the risk, your care team may give you other medications to take before receiving this one. Be sure to follow the directions from your care team. This medication can  cause a rash or redness in areas of the body that have previously had radiation therapy. If you have had radiation therapy, tell your care team if you notice a rash in this area. This medication may increase your risk of getting an infection. Call your care team for advice if you get a fever, chills, sore throat, or other symptoms of a cold or flu. Do not treat yourself. Try to avoid being around people who are sick. Be careful brushing or flossing your teeth or using a toothpick because you may get an infection or bleed more easily. If you have any dental work done, tell your dentist you are receiving this medication. Avoid taking medications that contain aspirin, acetaminophen, ibuprofen, naproxen,  or ketoprofen unless instructed by your care team. These medications may hide a fever. Check with your care team if you have severe diarrhea, nausea, and vomiting, or if you sweat a lot. The loss of too much body fluid may make it dangerous for you to take this medication. Talk to your care team if you or your partner wish to become pregnant or think either of you might be pregnant. This medication can cause serious birth defects if taken during pregnancy and for 6 months after the last dose. A negative pregnancy test is required before starting this medication. A reliable form of contraception is recommended while taking this medication and for 6 months after the last dose. Talk to your care team about reliable forms of contraception. Do not father a child while taking this medication and for 3 months after the last dose. Use a condom while having sex during this time period. Do not breastfeed while taking this medication and for 1 week after the last dose. This medication may cause infertility. Talk to your care team if you are concerned about your fertility. What side effects may I notice from receiving this medication? Side effects that you should report to your care team as soon as possible: Allergic reactions--skin rash, itching, hives, swelling of the face, lips, tongue, or throat Dry cough, shortness of breath or trouble breathing Infection--fever, chills, cough, sore throat, wounds that don't heal, pain or trouble when passing urine, general feeling of discomfort or being unwell Kidney injury--decrease in the amount of urine, swelling of the ankles, hands, or feet Low red blood cell level--unusual weakness or fatigue, dizziness, headache, trouble breathing Redness, blistering, peeling, or loosening of the skin, including inside the mouth Unusual bruising or bleeding Side effects that usually do not require medical attention (report to your care team if they continue or are  bothersome): Fatigue Loss of appetite Nausea Vomiting This list may not describe all possible side effects. Call your doctor for medical advice about side effects. You may report side effects to FDA at 1-800-FDA-1088. Where should I keep my medication? This medication is given in a hospital or clinic. It will not be stored at home. NOTE: This sheet is a summary. It may not cover all possible information. If you have questions about this medicine, talk to your doctor, pharmacist, or health care provider.  2024 Elsevier/Gold Standard (2022-03-10 00:00:00)  Carboplatin Injection What is this medication? CARBOPLATIN (KAR boe pla tin) treats some types of cancer. It works by slowing down the growth of cancer cells. This medicine may be used for other purposes; ask your health care provider or pharmacist if you have questions. COMMON BRAND NAME(S): Paraplatin What should I tell my care team before I take this medication? They need to know  if you have any of these conditions: Blood disorders Hearing problems Kidney disease Recent or ongoing radiation therapy An unusual or allergic reaction to carboplatin, cisplatin, other medications, foods, dyes, or preservatives Pregnant or trying to get pregnant Breast-feeding How should I use this medication? This medication is injected into a vein. It is given by your care team in a hospital or clinic setting. Talk to your care team about the use of this medication in children. Special care may be needed. Overdosage: If you think you have taken too much of this medicine contact a poison control center or emergency room at once. NOTE: This medicine is only for you. Do not share this medicine with others. What if I miss a dose? Keep appointments for follow-up doses. It is important not to miss your dose. Call your care team if you are unable to keep an appointment. What may interact with this medication? Medications for seizures Some antibiotics, such  as amikacin, gentamicin, neomycin, streptomycin, tobramycin Vaccines This list may not describe all possible interactions. Give your health care provider a list of all the medicines, herbs, non-prescription drugs, or dietary supplements you use. Also tell them if you smoke, drink alcohol, or use illegal drugs. Some items may interact with your medicine. What should I watch for while using this medication? Your condition will be monitored carefully while you are receiving this medication. You may need blood work while taking this medication. This medication may make you feel generally unwell. This is not uncommon, as chemotherapy can affect healthy cells as well as cancer cells. Report any side effects. Continue your course of treatment even though you feel ill unless your care team tells you to stop. In some cases, you may be given additional medications to help with side effects. Follow all directions for their use. This medication may increase your risk of getting an infection. Call your care team for advice if you get a fever, chills, sore throat, or other symptoms of a cold or flu. Do not treat yourself. Try to avoid being around people who are sick. Avoid taking medications that contain aspirin, acetaminophen, ibuprofen, naproxen, or ketoprofen unless instructed by your care team. These medications may hide a fever. Be careful brushing or flossing your teeth or using a toothpick because you may get an infection or bleed more easily. If you have any dental work done, tell your dentist you are receiving this medication. Talk to your care team if you wish to become pregnant or think you might be pregnant. This medication can cause serious birth defects. Talk to your care team about effective forms of contraception. Do not breast-feed while taking this medication. What side effects may I notice from receiving this medication? Side effects that you should report to your care team as soon as  possible: Allergic reactions--skin rash, itching, hives, swelling of the face, lips, tongue, or throat Infection--fever, chills, cough, sore throat, wounds that don't heal, pain or trouble when passing urine, general feeling of discomfort or being unwell Low red blood cell level--unusual weakness or fatigue, dizziness, headache, trouble breathing Pain, tingling, or numbness in the hands or feet, muscle weakness, change in vision, confusion or trouble speaking, loss of balance or coordination, trouble walking, seizures Unusual bruising or bleeding Side effects that usually do not require medical attention (report to your care team if they continue or are bothersome): Hair loss Nausea Unusual weakness or fatigue Vomiting This list may not describe all possible side effects. Call your doctor for medical  advice about side effects. You may report side effects to FDA at 1-800-FDA-1088. Where should I keep my medication? This medication is given in a hospital or clinic. It will not be stored at home. NOTE: This sheet is a summary. It may not cover all possible information. If you have questions about this medicine, talk to your doctor, pharmacist, or health care provider.  2024 Elsevier/Gold Standard (2022-02-24 00:00:00)

## 2024-01-20 NOTE — Progress Notes (Signed)
    DATE:  01/20/24                                        X MEDICATION REACTION            MD: Arbutus Ped   AGENT/BLOOD PRODUCT RECEIVING TODAY:                 AGENT/BLOOD PRODUCT RECEIVING IMMEDIATELY PRIOR TO REACTION:          Emend    Vitals:   01/20/24 1254 01/20/24 1256  BP: (!) 142/91 138/83  Pulse: 78 77  Resp: 18 18  SpO2: 100% 97%      REACTION(S):           shortness of breath and back pain   PREMEDS:     Decadron 10 mg IV,  Emend 150 mg IV, Aloxi 0.25 mg IV   INTERVENTION: Pepcid 20 mg IV   Review of Systems  Review of Systems  Respiratory:  Positive for shortness of breath.   Musculoskeletal:  Positive for back pain.  All other systems reviewed and are negative.    Physical Exam  Physical Exam Vitals and nursing note reviewed.  Constitutional:      Appearance: He is not ill-appearing or toxic-appearing.  HENT:     Head: Normocephalic.  Eyes:     Conjunctiva/sclera: Conjunctivae normal.  Cardiovascular:     Rate and Rhythm: Normal rate and regular rhythm.     Pulses: Normal pulses.     Heart sounds: Normal heart sounds.  Pulmonary:     Effort: Pulmonary effort is normal.     Breath sounds: Normal breath sounds.  Abdominal:     General: There is no distension.  Musculoskeletal:     Cervical back: Normal range of motion.  Skin:    General: Skin is warm and dry.  Neurological:     Mental Status: He is alert.     OUTCOME:                  Patient became symptomatic during Emend infusion after receiving over half of the dose.Emergency medications were administered as documented above. Patient returned to baseline. Oncologist notified and agrees to resume treatment. Emend will be removed from future cycles and added to allergy list. Patient tolerated remainder of treatment.

## 2024-01-20 NOTE — Progress Notes (Signed)
 Hypersensitivity Reaction note  Date of event: 01/20/24 Time of event: 1249 Generic name of drug involved: Fosaprepitant Name of provider notified of the hypersensitivity reaction: Daphane Shepherd, PA and Dr. Arbutus Ped Was agent that likely caused hypersensitivity reaction added to Allergies List within EMR? Yes Chain of events including reaction signs/symptoms, treatment administered, and outcome (e.g., drug resumed; drug discontinued; sent to Emergency Department; etc.) Patient was receiving his second dose of Emend and he said he was SOB, mild flushing and had some chest pain. That rapidly developed into back pain 7/10. Infusion stopped at 1249 and Daphane Shepherd PA- symptom management was called. IV fluids were started and then pepcid 20mg  started. Patient was coherent the entire time. AS the pepcid was flowing the symptoms quickly faded and patient was comfortable. VS were stable on RA the entire episode. At about 1320 with discussion between Karie Fetch., Dr. Arbutus Ped and the pharmacist- the patient was cleared to go forward with the rest of treatment (EMEND was discontinued and removed from the treatment plan). Wife aware and at the chairside.  Mickeal Needy, RN 01/20/2024 2:27 PM

## 2024-01-27 ENCOUNTER — Telehealth: Payer: Self-pay

## 2024-01-27 ENCOUNTER — Inpatient Hospital Stay

## 2024-01-27 DIAGNOSIS — C3412 Malignant neoplasm of upper lobe, left bronchus or lung: Secondary | ICD-10-CM

## 2024-01-27 DIAGNOSIS — Z5111 Encounter for antineoplastic chemotherapy: Secondary | ICD-10-CM | POA: Diagnosis not present

## 2024-01-27 LAB — CMP (CANCER CENTER ONLY)
ALT: 34 U/L (ref 0–44)
AST: 30 U/L (ref 15–41)
Albumin: 3.9 g/dL (ref 3.5–5.0)
Alkaline Phosphatase: 46 U/L (ref 38–126)
Anion gap: 7 (ref 5–15)
BUN: 25 mg/dL — ABNORMAL HIGH (ref 8–23)
CO2: 26 mmol/L (ref 22–32)
Calcium: 9.2 mg/dL (ref 8.9–10.3)
Chloride: 106 mmol/L (ref 98–111)
Creatinine: 0.98 mg/dL (ref 0.61–1.24)
GFR, Estimated: >60 mL/min (ref >60)
Glucose, Bld: 122 mg/dL — ABNORMAL HIGH (ref 70–99)
Potassium: 4.1 mmol/L (ref 3.5–5.1)
Sodium: 139 mmol/L (ref 135–145)
Total Bilirubin: 0.9 mg/dL (ref 0.0–1.2)
Total Protein: 7.2 g/dL (ref 6.5–8.1)

## 2024-01-27 LAB — CBC WITH DIFFERENTIAL (CANCER CENTER ONLY)
Abs Immature Granulocytes: 0.01 10*3/uL (ref 0.00–0.07)
Basophils Absolute: 0 10*3/uL (ref 0.0–0.1)
Basophils Relative: 0 %
Eosinophils Absolute: 0 10*3/uL (ref 0.0–0.5)
Eosinophils Relative: 0 %
HCT: 40 % (ref 39.0–52.0)
Hemoglobin: 13.6 g/dL (ref 13.0–17.0)
Immature Granulocytes: 0 %
Lymphocytes Relative: 64 %
Lymphs Abs: 1.5 10*3/uL (ref 0.7–4.0)
MCH: 29.9 pg (ref 26.0–34.0)
MCHC: 34 g/dL (ref 30.0–36.0)
MCV: 87.9 fL (ref 80.0–100.0)
Monocytes Absolute: 0.1 10*3/uL (ref 0.1–1.0)
Monocytes Relative: 2 %
Neutro Abs: 0.8 10*3/uL — ABNORMAL LOW (ref 1.7–7.7)
Neutrophils Relative %: 34 %
Platelet Count: 172 10*3/uL (ref 150–400)
RBC: 4.55 MIL/uL (ref 4.22–5.81)
RDW: 13.4 % (ref 11.5–15.5)
WBC Count: 2.5 10*3/uL — ABNORMAL LOW (ref 4.0–10.5)
nRBC: 0 % (ref 0.0–0.2)

## 2024-01-27 NOTE — Telephone Encounter (Signed)
 LM for Pt advising of lab result of WBC 2.5 and the risk for infections.  Neutropenic precautions given.

## 2024-01-27 NOTE — Telephone Encounter (Signed)
 error

## 2024-01-27 NOTE — Telephone Encounter (Signed)
-----   Message from Johnette Abraham Heilingoetter sent at 01/27/2024  8:23 AM EDT ----- Myriam Jacobson, can you call him and review neutropenic precautions with him ----- Message ----- From: Interface, Lab In [facility] Sent: 01/27/2024   8:20 AM EDT To: Johnette Abraham Heilingoetter, PA-C

## 2024-01-30 ENCOUNTER — Encounter: Payer: Self-pay | Admitting: Internal Medicine

## 2024-02-03 ENCOUNTER — Inpatient Hospital Stay

## 2024-02-03 DIAGNOSIS — C3412 Malignant neoplasm of upper lobe, left bronchus or lung: Secondary | ICD-10-CM

## 2024-02-03 DIAGNOSIS — Z5111 Encounter for antineoplastic chemotherapy: Secondary | ICD-10-CM | POA: Diagnosis not present

## 2024-02-03 LAB — CMP (CANCER CENTER ONLY)
ALT: 45 U/L — ABNORMAL HIGH (ref 0–44)
AST: 32 U/L (ref 15–41)
Albumin: 4.2 g/dL (ref 3.5–5.0)
Alkaline Phosphatase: 46 U/L (ref 38–126)
Anion gap: 6 (ref 5–15)
BUN: 15 mg/dL (ref 8–23)
CO2: 28 mmol/L (ref 22–32)
Calcium: 9.4 mg/dL (ref 8.9–10.3)
Chloride: 105 mmol/L (ref 98–111)
Creatinine: 0.89 mg/dL (ref 0.61–1.24)
GFR, Estimated: >60 mL/min (ref >60)
Glucose, Bld: 120 mg/dL — ABNORMAL HIGH (ref 70–99)
Potassium: 4.2 mmol/L (ref 3.5–5.1)
Sodium: 139 mmol/L (ref 135–145)
Total Bilirubin: 0.4 mg/dL (ref 0.0–1.2)
Total Protein: 7.2 g/dL (ref 6.5–8.1)

## 2024-02-03 LAB — CBC WITH DIFFERENTIAL (CANCER CENTER ONLY)
Abs Immature Granulocytes: 0.01 10*3/uL (ref 0.00–0.07)
Basophils Absolute: 0 10*3/uL (ref 0.0–0.1)
Basophils Relative: 0 %
Eosinophils Absolute: 0.1 10*3/uL (ref 0.0–0.5)
Eosinophils Relative: 2 %
HCT: 39.2 % (ref 39.0–52.0)
Hemoglobin: 13.1 g/dL (ref 13.0–17.0)
Immature Granulocytes: 0 %
Lymphocytes Relative: 59 %
Lymphs Abs: 2.4 10*3/uL (ref 0.7–4.0)
MCH: 29.8 pg (ref 26.0–34.0)
MCHC: 33.4 g/dL (ref 30.0–36.0)
MCV: 89.1 fL (ref 80.0–100.0)
Monocytes Absolute: 0.4 10*3/uL (ref 0.1–1.0)
Monocytes Relative: 10 %
Neutro Abs: 1.2 10*3/uL — ABNORMAL LOW (ref 1.7–7.7)
Neutrophils Relative %: 29 %
Platelet Count: 147 10*3/uL — ABNORMAL LOW (ref 150–400)
RBC: 4.4 MIL/uL (ref 4.22–5.81)
RDW: 14 % (ref 11.5–15.5)
WBC Count: 4.2 10*3/uL (ref 4.0–10.5)
nRBC: 0 % (ref 0.0–0.2)

## 2024-02-05 ENCOUNTER — Encounter: Payer: Self-pay | Admitting: Internal Medicine

## 2024-02-10 ENCOUNTER — Inpatient Hospital Stay: Payer: BC Managed Care – PPO

## 2024-02-10 ENCOUNTER — Inpatient Hospital Stay (HOSPITAL_BASED_OUTPATIENT_CLINIC_OR_DEPARTMENT_OTHER): Payer: BC Managed Care – PPO | Admitting: Internal Medicine

## 2024-02-10 VITALS — BP 153/73 | HR 66 | Temp 97.6°F | Resp 17 | Ht 74.5 in | Wt 247.8 lb

## 2024-02-10 DIAGNOSIS — Z5111 Encounter for antineoplastic chemotherapy: Secondary | ICD-10-CM | POA: Diagnosis not present

## 2024-02-10 DIAGNOSIS — C3490 Malignant neoplasm of unspecified part of unspecified bronchus or lung: Secondary | ICD-10-CM

## 2024-02-10 DIAGNOSIS — C3412 Malignant neoplasm of upper lobe, left bronchus or lung: Secondary | ICD-10-CM

## 2024-02-10 LAB — CBC WITH DIFFERENTIAL (CANCER CENTER ONLY)
Abs Immature Granulocytes: 0.01 10*3/uL (ref 0.00–0.07)
Basophils Absolute: 0 10*3/uL (ref 0.0–0.1)
Basophils Relative: 0 %
Eosinophils Absolute: 0.1 10*3/uL (ref 0.0–0.5)
Eosinophils Relative: 2 %
HCT: 37.8 % — ABNORMAL LOW (ref 39.0–52.0)
Hemoglobin: 12.8 g/dL — ABNORMAL LOW (ref 13.0–17.0)
Immature Granulocytes: 0 %
Lymphocytes Relative: 53 %
Lymphs Abs: 2.7 10*3/uL (ref 0.7–4.0)
MCH: 30 pg (ref 26.0–34.0)
MCHC: 33.9 g/dL (ref 30.0–36.0)
MCV: 88.7 fL (ref 80.0–100.0)
Monocytes Absolute: 0.5 10*3/uL (ref 0.1–1.0)
Monocytes Relative: 11 %
Neutro Abs: 1.7 10*3/uL (ref 1.7–7.7)
Neutrophils Relative %: 34 %
Platelet Count: 272 10*3/uL (ref 150–400)
RBC: 4.26 MIL/uL (ref 4.22–5.81)
RDW: 15.9 % — ABNORMAL HIGH (ref 11.5–15.5)
WBC Count: 5 10*3/uL (ref 4.0–10.5)
nRBC: 0 % (ref 0.0–0.2)

## 2024-02-10 LAB — CMP (CANCER CENTER ONLY)
ALT: 31 U/L (ref 0–44)
AST: 23 U/L (ref 15–41)
Albumin: 4.2 g/dL (ref 3.5–5.0)
Alkaline Phosphatase: 49 U/L (ref 38–126)
Anion gap: 5 (ref 5–15)
BUN: 16 mg/dL (ref 8–23)
CO2: 28 mmol/L (ref 22–32)
Calcium: 9.5 mg/dL (ref 8.9–10.3)
Chloride: 107 mmol/L (ref 98–111)
Creatinine: 0.87 mg/dL (ref 0.61–1.24)
GFR, Estimated: >60 mL/min (ref >60)
Glucose, Bld: 151 mg/dL — ABNORMAL HIGH (ref 70–99)
Potassium: 4.1 mmol/L (ref 3.5–5.1)
Sodium: 140 mmol/L (ref 135–145)
Total Bilirubin: 0.4 mg/dL (ref 0.0–1.2)
Total Protein: 7.2 g/dL (ref 6.5–8.1)

## 2024-02-10 LAB — TSH: TSH: 1.304 u[IU]/mL (ref 0.350–4.500)

## 2024-02-10 MED ORDER — PALONOSETRON HCL INJECTION 0.25 MG/5ML
0.2500 mg | Freq: Once | INTRAVENOUS | Status: AC
  Administered 2024-02-10: 0.25 mg via INTRAVENOUS
  Filled 2024-02-10: qty 5

## 2024-02-10 MED ORDER — SODIUM CHLORIDE 0.9 % IV SOLN
360.0000 mg | Freq: Once | INTRAVENOUS | Status: AC
  Administered 2024-02-10: 360 mg via INTRAVENOUS
  Filled 2024-02-10: qty 24

## 2024-02-10 MED ORDER — DEXAMETHASONE SODIUM PHOSPHATE 10 MG/ML IJ SOLN
10.0000 mg | Freq: Once | INTRAMUSCULAR | Status: AC
  Administered 2024-02-10: 10 mg via INTRAVENOUS
  Filled 2024-02-10: qty 1

## 2024-02-10 MED ORDER — SODIUM CHLORIDE 0.9% FLUSH: 10.0000 mL | INTRAVENOUS | Status: DC | PRN

## 2024-02-10 MED ORDER — SODIUM CHLORIDE 0.9 % IV SOLN
500.0000 mg/m2 | Freq: Once | INTRAVENOUS | Status: AC
  Administered 2024-02-10: 1200 mg via INTRAVENOUS
  Filled 2024-02-10: qty 40

## 2024-02-10 MED ORDER — CYANOCOBALAMIN 1000 MCG/ML IJ SOLN
1000.0000 ug | Freq: Once | INTRAMUSCULAR | Status: AC
Start: 2024-02-10 — End: 2024-02-10
  Administered 2024-02-10: 1000 ug via INTRAMUSCULAR
  Filled 2024-02-10: qty 1

## 2024-02-10 MED ORDER — SODIUM CHLORIDE 0.9 % IV SOLN
605.5000 mg | Freq: Once | INTRAVENOUS | Status: AC
  Administered 2024-02-10: 610 mg via INTRAVENOUS
  Filled 2024-02-10: qty 61

## 2024-02-10 MED ORDER — HEPARIN SOD (PORK) LOCK FLUSH 100 UNIT/ML IV SOLN: 500.0000 [IU] | Freq: Once | INTRAVENOUS | Status: DC | PRN

## 2024-02-10 MED ORDER — SODIUM CHLORIDE 0.9 % IV SOLN: INTRAVENOUS | Status: DC

## 2024-02-10 NOTE — Progress Notes (Signed)
 [facility] [phone]   [phone]  OFFICE PROGRESS NOTE  Lonie Peak, PA-C [address] [place] [address]  DIAGNOSIS: Stage IIIA (T3, N2, M0) non-small cell lung cancer, favoring adenocarcinoma presented with left hilar soft tissue mass in addition to left hilar and mediastinal lymphadenopathy diagnosed in January 2025.   PRIOR THERAPY: None  CURRENT THERAPY: Neoadjuvant systemic chemotherapy with carboplatin for AUC of 5, Alimta 500 Mg/M2 and nivolumab 360 Mg IV every 3 weeks.  First dose December 30, 2023.  Status post 2 cycles.  INTERVAL HISTORY: Jackson Powers 78 y.o. male returns to the clinic today for follow-up visit accompanied by his wife.Discussed the use of AI scribe software for clinical note transcription with the patient, who gave verbal consent to proceed.  History of Present Illness   Jackson Powers is a 78 year old male with stage IIIA non-small cell lung cancer who presents for evaluation before starting cycle number three of neoadjuvant systemic chemotherapy. He is accompanied by his wife.  He was diagnosed with stage IIIA non-small cell lung cancer, favoring adenocarcinoma, in January 2025. The diagnosis was established following a series of diagnostic evaluations, including imaging and biopsy, which confirmed the presence of the malignancy.  He is currently undergoing neoadjuvant systemic chemotherapy with carboplatin, Alimta, and nivolumab. He has completed two cycles of this regimen and is here for evaluation before starting the third cycle.  Over the past three weeks, he has been handling the chemotherapy relatively well. He experiences frequent nausea and occasional feelings of potential vomiting, though he has not vomited. He also reports feeling tired at times. He has been prescribed anti-nausea medication, which he uses as needed to manage these symptoms.  No vomiting, diarrhea, or other gastrointestinal symptoms  aside from nausea.       MEDICAL HISTORY: Past Medical History:  Diagnosis Date   Acute gastritis without hemorrhage 2007   Arthritis    osteoarthritis    Benign neoplasm of colon 2007   COPD (chronic obstructive pulmonary disease) (HCC)    Esophagitis 2007   Hypertension    Orthopnea    reports " i have trouble breathing when i lie completely flat. i have to use 2 people to prop my head up"    Panic anxiety syndrome    patient reports having severe panic anxiety and claustraphobia. states with ear surgery years ago , they had to tie him down which caused him to panic even more;    Right BBB/left ant fasc block    (OLD)   TIA (transient ischemic attack)     ALLERGIES:  is allergic to fosaprepitant.  MEDICATIONS:  Current Outpatient Medications  Medication Sig Dispense Refill   amLODipine (NORVASC) 10 MG tablet Take 10 mg by mouth daily.     clopidogrel (PLAVIX) 75 MG tablet Take 75 mg by mouth daily.     fluticasone (FLONASE) 50 MCG/ACT nasal spray Place 1 spray into both nostrils daily. 16 g 2   Fluticasone-Umeclidin-Vilant (TRELEGY ELLIPTA) 100-62.5-25 MCG/ACT AEPB TAKE 1 PUFF BY MOUTH EVERY DAY (Patient not taking: Reported on 12/14/2023) 60 each 3   folic acid (FOLVITE) 1 MG tablet Take 1 tablet (1 mg total) by mouth daily. Start 7 days before pemetrexed chemotherapy. Continue until 21 days after pemetrexed completed. 100 tablet 3   metoprolol succinate (TOPROL-XL) 25 MG 24 hr tablet Take 25 mg by mouth daily.     ondansetron (ZOFRAN) 8 MG tablet Take 1  tablet (8 mg total) by mouth every 8 (eight) hours as needed for nausea or vomiting. Start on the third day after carboplatin. 30 tablet 1   prochlorperazine (COMPAZINE) 10 MG tablet Take 1 tablet (10 mg total) by mouth every 6 (six) hours as needed for nausea or vomiting. 30 tablet 1   No current facility-administered medications for this visit.   Facility-Administered Medications Ordered in Other Visits  Medication Dose  Route Frequency Provider Last Rate Last Admin   sodium chloride flush (NS) 0.9 % injection 3 mL  3 mL Intravenous Q12H Laurier Nancy, MD        SURGICAL HISTORY:  Past Surgical History:  Procedure Laterality Date   BRONCHIAL BIOPSY  12/02/2023   Procedure: BRONCHIAL BIOPSIES;  Surgeon: Raechel Chute, MD;  Location: [facility];  Service: Pulmonary;;   BRONCHIAL BRUSHINGS  12/02/2023   Procedure: BRONCHIAL BRUSHINGS;  Surgeon: Raechel Chute, MD;  Location: [facility];  Service: Pulmonary;;   BRONCHIAL NEEDLE ASPIRATION BIOPSY  12/02/2023   Procedure: BRONCHIAL NEEDLE ASPIRATION BIOPSIES;  Surgeon: Raechel Chute, MD;  Location: [facility];  Service: Pulmonary;;   BRONCHIAL WASHINGS  12/02/2023   Procedure: BRONCHIAL WASHINGS;  Surgeon: Raechel Chute, MD;  Location: [facility];  Service: Pulmonary;;   LEFT HEART CATH AND CORONARY ANGIOGRAPHY Left 10/01/2020   Procedure: LEFT HEART CATH AND CORONARY ANGIOGRAPHY;  Surgeon: Laurier Nancy, MD;  Location: [facility];  Service: Cardiovascular;  Laterality: Left;   SKIN CANCER EXCISION Right 2017   EAR; FAILED GRAFT FOLLWOING SURGERY    TOTAL HIP ARTHROPLASTY Right 07/20/2018   Procedure: RIGHT TOTAL HIP ARTHROPLASTY ANTERIOR APPROACH;  Surgeon: Ollen Gross, MD;  Location: [facility];  Service: Orthopedics;  Laterality: Right;   VIDEO BRONCHOSCOPY WITH ENDOBRONCHIAL ULTRASOUND  12/02/2023   Procedure: VIDEO BRONCHOSCOPY WITH ENDOBRONCHIAL ULTRASOUND;  Surgeon: Raechel Chute, MD;  Location: [facility];  Service: Pulmonary;;    REVIEW OF SYSTEMS:  A comprehensive review of systems was negative except for: Constitutional: positive for fatigue Gastrointestinal: positive for nausea   PHYSICAL EXAMINATION: General appearance: alert, cooperative, fatigued, and no distress Head: Normocephalic, without obvious abnormality, atraumatic Neck: no adenopathy, no JVD, supple, symmetrical, trachea midline, and thyroid not enlarged, symmetric,  no tenderness/mass/nodules Lymph nodes: Cervical, supraclavicular, and axillary nodes normal. Resp: clear to auscultation bilaterally Back: symmetric, no curvature. ROM normal. No CVA tenderness. Cardio: regular rate and rhythm, S1, S2 normal, no murmur, click, rub or gallop GI: soft, non-tender; bowel sounds normal; no masses,  no organomegaly Extremities: extremities normal, atraumatic, no cyanosis or edema  ECOG PERFORMANCE STATUS: 1 - Symptomatic but completely ambulatory  Blood pressure (!) 153/73, pulse 66, temperature 97.6 F (36.4 C), temperature source Temporal, resp. rate 17, height 6' 2.5" (1.892 m), weight 247 lb 12.8 oz (112.4 kg), SpO2 98%.  LABORATORY DATA: Lab Results  Component Value Date   WBC 5.0 02/10/2024   HGB 12.8 (L) 02/10/2024   HCT 37.8 (L) 02/10/2024   MCV 88.7 02/10/2024   PLT 272 02/10/2024      Chemistry      Component Value Date/Time   NA 139 02/03/2024 0810   K 4.2 02/03/2024 0810   CL 105 02/03/2024 0810   CO2 28 02/03/2024 0810   BUN 15 02/03/2024 0810   CREATININE 0.89 02/03/2024 0810      Component Value Date/Time   CALCIUM 9.4 02/03/2024 0810   ALKPHOS 46 02/03/2024 0810   AST 32 02/03/2024 0810   ALT 45 (H)  02/03/2024 0810   BILITOT 0.4 02/03/2024 0810       RADIOGRAPHIC STUDIES: No results found.   ASSESSMENT AND PLAN: This is a very pleasant 78 years old white male with Stage IIIA (T3, N2, M0) non-small cell lung cancer, favoring adenocarcinoma presented with left hilar soft tissue mass in addition to left hilar and mediastinal lymphadenopathy diagnosed in January 2025.  He is currently undergoing neoadjuvant systemic chemotherapy with carboplatin for AUC of 5, Alimta 500 Mg/M2 and nivolumab 360 Mg IV every 3 weeks status post 2 cycles.  He has been tolerating this treatment fairly well.    Stage IIIA non-small cell lung cancer, adenocarcinoma Stage IIIA non-small cell lung cancer, favoring adenocarcinoma, diagnosed in January  2025. Currently undergoing neoadjuvant systemic chemotherapy with carboplatin, Alimta, and nivolumab. He has completed two cycles and reports nausea and occasional vomiting, but overall tolerates chemotherapy well. Lab work is satisfactory for proceeding with treatment. - Proceed with cycle three of neoadjuvant chemotherapy and immunotherapy. - Schedule CT scan to assess response to treatment one week before next appointment. - Plan surgical consultation if CT scan shows good response to treatment.   The patient was advised to call immediately if he has any concerning symptoms in the interval. The patient voices understanding of current disease status and treatment options and is in agreement with the current care plan.  All questions were answered. The patient knows to call the clinic with any problems, questions or concerns. We can certainly see the patient much sooner if necessary.  The total time spent in the appointment was 20 minutes.  Disclaimer: This note was dictated with voice recognition software. Similar sounding words can inadvertently be transcribed and may not be corrected upon review.

## 2024-02-10 NOTE — Patient Instructions (Signed)
 [facility] - A DEPT OF [facility]  Discharge Instructions: Thank you for choosing [facility] to provide your oncology and hematology care.   If you have a lab appointment with the Cancer Center, please go directly to the Cancer Center and check in at the registration area.   Wear comfortable clothing and clothing appropriate for easy access to any Portacath or PICC line.   We strive to give you quality time with your provider. You may need to reschedule your appointment if you arrive late (15 or more minutes).  Arriving late affects you and other patients whose appointments are after yours.  Also, if you miss three or more appointments without notifying the office, you may be dismissed from the clinic at the provider's discretion.      For prescription refill requests, have your pharmacy contact our office and allow 72 hours for refills to be completed.    Today you received the following chemotherapy and/or immunotherapy agents: Alimta, Opdivo, Carboplatin.       To help prevent nausea and vomiting after your treatment, we encourage you to take your nausea medication as directed.  BELOW ARE SYMPTOMS THAT SHOULD BE REPORTED IMMEDIATELY: *FEVER GREATER THAN 100.4 F (38 C) OR HIGHER *CHILLS OR SWEATING *NAUSEA AND VOMITING THAT IS NOT CONTROLLED WITH YOUR NAUSEA MEDICATION *UNUSUAL SHORTNESS OF BREATH *UNUSUAL BRUISING OR BLEEDING *URINARY PROBLEMS (pain or burning when urinating, or frequent urination) *BOWEL PROBLEMS (unusual diarrhea, constipation, pain near the anus) TENDERNESS IN MOUTH AND THROAT WITH OR WITHOUT PRESENCE OF ULCERS (sore throat, sores in mouth, or a toothache) UNUSUAL RASH, SWELLING OR PAIN  UNUSUAL VAGINAL DISCHARGE OR ITCHING   Items with * indicate a potential emergency and should be followed up as soon as possible or go to the Emergency Department if any problems should occur.  Please show the CHEMOTHERAPY ALERT  CARD or IMMUNOTHERAPY ALERT CARD at check-in to the Emergency Department and triage nurse.  Should you have questions after your visit or need to cancel or reschedule your appointment, please contact [facility] - A DEPT OF [facility]  Dept: [phone]  and follow the prompts.  Office hours are 8:00 a.m. to 4:30 p.m. Monday - Friday. Please note that voicemails left after 4:00 p.m. may not be returned until the following business day.  We are closed weekends and major holidays. You have access to a nurse at all times for urgent questions. Please call the main number to the clinic Dept: [phone] and follow the prompts.   For any non-urgent questions, you may also contact your provider using MyChart. We now offer e-Visits for anyone 57 and older to request care online for non-urgent symptoms. For details visit [email or web address].   Also download the MyChart app! Go to the app store, search "MyChart", open the app, select [place], and log in with your MyChart username and password.

## 2024-02-11 ENCOUNTER — Telehealth: Payer: Self-pay | Admitting: Internal Medicine

## 2024-02-11 ENCOUNTER — Other Ambulatory Visit: Payer: Self-pay

## 2024-02-11 NOTE — Telephone Encounter (Signed)
 Rescheduled appointments per patient request.

## 2024-02-19 ENCOUNTER — Emergency Department (HOSPITAL_COMMUNITY)
Admission: EM | Admit: 2024-02-19 | Discharge: 2024-02-19 | Disposition: A | Discharge status: Home / Self Care | Attending: Emergency Medicine | Admitting: Emergency Medicine

## 2024-02-19 ENCOUNTER — Emergency Department (HOSPITAL_COMMUNITY)

## 2024-02-19 ENCOUNTER — Other Ambulatory Visit: Payer: Self-pay

## 2024-02-19 DIAGNOSIS — K5641 Fecal impaction: Secondary | ICD-10-CM | POA: Insufficient documentation

## 2024-02-19 DIAGNOSIS — Z79899 Other long term (current) drug therapy: Secondary | ICD-10-CM | POA: Diagnosis not present

## 2024-02-19 DIAGNOSIS — K5900 Constipation, unspecified: Secondary | ICD-10-CM

## 2024-02-19 DIAGNOSIS — I10 Essential (primary) hypertension: Secondary | ICD-10-CM | POA: Diagnosis not present

## 2024-02-19 DIAGNOSIS — C3490 Malignant neoplasm of unspecified part of unspecified bronchus or lung: Secondary | ICD-10-CM | POA: Diagnosis not present

## 2024-02-19 DIAGNOSIS — J449 Chronic obstructive pulmonary disease, unspecified: Secondary | ICD-10-CM | POA: Diagnosis not present

## 2024-02-19 DIAGNOSIS — Z7951 Long term (current) use of inhaled steroids: Secondary | ICD-10-CM | POA: Diagnosis not present

## 2024-02-19 LAB — CBC WITH DIFFERENTIAL/PLATELET
Abs Immature Granulocytes: 0.03 K/uL (ref 0.00–0.07)
Basophils Absolute: 0 K/uL (ref 0.0–0.1)
Basophils Relative: 0 %
Eosinophils Absolute: 0 K/uL (ref 0.0–0.5)
Eosinophils Relative: 0 %
HCT: 34.9 % — ABNORMAL LOW (ref 39.0–52.0)
Hemoglobin: 12.1 g/dL — ABNORMAL LOW (ref 13.0–17.0)
Immature Granulocytes: 1 %
Lymphocytes Relative: 24 %
Lymphs Abs: 0.9 K/uL (ref 0.7–4.0)
MCH: 30.5 pg (ref 26.0–34.0)
MCHC: 34.7 g/dL (ref 30.0–36.0)
MCV: 87.9 fL (ref 80.0–100.0)
Monocytes Absolute: 0.4 K/uL (ref 0.1–1.0)
Monocytes Relative: 12 %
Neutro Abs: 2.3 K/uL (ref 1.7–7.7)
Neutrophils Relative %: 63 %
Platelets: 129 K/uL — ABNORMAL LOW (ref 150–400)
RBC: 3.97 MIL/uL — ABNORMAL LOW (ref 4.22–5.81)
RDW: 15.1 % (ref 11.5–15.5)
WBC: 3.6 K/uL — ABNORMAL LOW (ref 4.0–10.5)
nRBC: 0 % (ref 0.0–0.2)

## 2024-02-19 LAB — COMPREHENSIVE METABOLIC PANEL WITH GFR
ALT: 44 U/L (ref 0–44)
AST: 38 U/L (ref 15–41)
Albumin: 4.1 g/dL (ref 3.5–5.0)
Alkaline Phosphatase: 43 U/L (ref 38–126)
Anion gap: 11 (ref 5–15)
BUN: 22 mg/dL (ref 8–23)
CO2: 22 mmol/L (ref 22–32)
Calcium: 9 mg/dL (ref 8.9–10.3)
Chloride: 107 mmol/L (ref 98–111)
Creatinine, Ser: 0.78 mg/dL (ref 0.61–1.24)
GFR, Estimated: >60 mL/min (ref >60)
Glucose, Bld: 133 mg/dL — ABNORMAL HIGH (ref 70–99)
Potassium: 4 mmol/L (ref 3.5–5.1)
Sodium: 140 mmol/L (ref 135–145)
Total Bilirubin: 0.9 mg/dL (ref 0.0–1.2)
Total Protein: 7.4 g/dL (ref 6.5–8.1)

## 2024-02-19 LAB — LACTIC ACID, PLASMA: Lactic Acid, Venous: 1.9 mmol/L (ref 0.5–1.9)

## 2024-02-19 MED ORDER — FLEET ENEMA RE ENEM
1.0000 | ENEMA | Freq: Once | RECTAL | Status: AC
  Administered 2024-02-19: 1 via RECTAL
  Filled 2024-02-19: qty 1

## 2024-02-19 NOTE — Discharge Instructions (Addendum)
 Your presentation was consistent with fecal impaction and constipation.  Your rectum was disimpacted at bedside and you were administered an enema with good results.  Recommend you increase your home MiraLAX to 4 capfuls in a 32 ounce Gatorade, drink over 4 hours, can repeat again for satisfactory bowel movement if this recurs.  Then can continue with maintenance MiraLAX at 1 capful a day.

## 2024-02-19 NOTE — ED Notes (Signed)
 Nurse chaperoned disimpaction

## 2024-02-19 NOTE — ED Provider Notes (Signed)
 [place] EMERGENCY DEPARTMENT AT [facility] Provider Note   CSN: [id number] Arrival date & time: 02/19/24  1624     History  Chief Complaint  Patient presents with   Constipation    Jackson Powers is a 78 y.o. male.   Constipation    78 year old male with medical history significant for gastritis, COPD, hypertension, TIA, lung cancer on chemotherapy who presents to the emergency department with concern for constipation.  The patient states that he has not had a bowel movement in the past 3 days.  He is passing gas.  He denies any abdominal pain, nausea or vomiting.  He denies any fevers or chills.  He states that he tried to have a bowel movement this morning and felt that it was too large and painful to pass.  He has been trying suppositories at home as well as Dulcolax chews without relief.  Home Medications Prior to Admission medications   Medication Sig Start Date End Date Taking? Authorizing Provider  amLODipine (NORVASC) 10 MG tablet Take 10 mg by mouth daily.    [provider]  clopidogrel (PLAVIX) 75 MG tablet Take 75 mg by mouth daily.    [provider]  fluticasone (FLONASE) 50 MCG/ACT nasal spray Place 1 spray into both nostrils daily. 06/15/23   Martina Sinner, MD  Fluticasone-Umeclidin-Vilant (TRELEGY ELLIPTA) 100-62.5-25 MCG/ACT AEPB TAKE 1 PUFF BY MOUTH EVERY DAY Patient not taking: Reported on 12/14/2023 11/08/23   Martina Sinner, MD  folic acid (FOLVITE) 1 MG tablet Take 1 tablet (1 mg total) by mouth daily. Start 7 days before pemetrexed chemotherapy. Continue until 21 days after pemetrexed completed. 12/23/23   Si Gaul, MD  metoprolol succinate (TOPROL-XL) 25 MG 24 hr tablet Take 25 mg by mouth daily.    [provider]  ondansetron (ZOFRAN) 8 MG tablet Take 1 tablet (8 mg total) by mouth every 8 (eight) hours as needed for nausea or vomiting. Start on the third day after carboplatin. 12/23/23   Si Gaul, MD   prochlorperazine (COMPAZINE) 10 MG tablet Take 1 tablet (10 mg total) by mouth every 6 (six) hours as needed for nausea or vomiting. 12/23/23   Si Gaul, MD      Allergies    Fosaprepitant    Review of Systems   Review of Systems  Gastrointestinal:  Positive for constipation.  All other systems reviewed and are negative.   Physical Exam Updated Vital Signs BP (!) 148/88   Pulse 87   Temp 98 F (36.7 C) (Oral)   Resp 17   Wt 111.1 kg   SpO2 97%   BMI 31.04 kg/m  Physical Exam Vitals and nursing note reviewed. Exam conducted with a chaperone present.  Constitutional:      General: He is not in acute distress. HENT:     Head: Normocephalic and atraumatic.  Eyes:     Conjunctiva/sclera: Conjunctivae normal.     Pupils: Pupils are equal, round, and reactive to light.  Cardiovascular:     Rate and Rhythm: Normal rate and regular rhythm.  Pulmonary:     Effort: Pulmonary effort is normal. No respiratory distress.  Abdominal:     General: There is distension.     Tenderness: There is no abdominal tenderness. There is no guarding.  Genitourinary:    Comments: Fecal impaction present Musculoskeletal:        General: No deformity or signs of injury.     Cervical back: Neck supple.  Skin:  Findings: No lesion or rash.  Neurological:     General: No focal deficit present.     Mental Status: He is alert. Mental status is at baseline.     ED Results / Procedures / Treatments   Labs (all labs ordered are listed, but only abnormal results are displayed) Labs Reviewed  CBC WITH DIFFERENTIAL/PLATELET - Abnormal; Notable for the following components:      Result Value   WBC 3.6 (*)    RBC 3.97 (*)    Hemoglobin 12.1 (*)    HCT 34.9 (*)    Platelets 129 (*)    All other components within normal limits  COMPREHENSIVE METABOLIC PANEL WITH GFR - Abnormal; Notable for the following components:   Glucose, Bld 133 (*)    All other components within normal limits   LACTIC ACID, PLASMA    EKG None  Radiology DG Abdomen 1 View Result Date: 02/19/2024 CLINICAL DATA:  Constipation. EXAM: ABDOMEN - 1 VIEW COMPARISON:  None Available. FINDINGS: No bowel dilatation or evidence of obstruction. Small to moderate volume of stool in the colon. Mild rectal distention with stool, 5.6 cm. Phleboliths in the pelvis. Included lung bases are clear. Right hip arthroplasty. IMPRESSION: Small to moderate volume of stool in the colon. Mild rectal distention with stool. Electronically Signed   By: Narda Rutherford M.D.   On: 02/19/2024 18:25    Procedures .Fecal disimpaction  Date/Time: 02/19/2024 6:51 PM  Performed by: Ernie Avena, MD Authorized by: Ernie Avena, MD  Consent: Verbal consent obtained. Risks and benefits: risks, benefits and alternatives were discussed Consent given by: patient Patient identity confirmed: verbally with patient and arm band Time out: Immediately prior to procedure a "time out" was called to verify the correct patient, procedure, equipment, support staff and site/side marked as required. Patient tolerance: patient tolerated the procedure well with no immediate complications       Medications Ordered in ED Medications  sodium phosphate (FLEET) enema 1 enema (1 enema Rectal Given 02/19/24 1928)    ED Course/ Medical Decision Making/ A&P                                 Medical Decision Making Amount and/or Complexity of Data Reviewed Labs: ordered. Radiology: ordered.  Risk OTC drugs.    78 year old male with medical history significant for gastritis, COPD, hypertension, TIA, lung cancer on chemotherapy who presents to the emergency department with concern for constipation.  The patient states that he has not had a bowel movement in the past 3 days.  He is passing gas.  He denies any abdominal pain, nausea or vomiting.  He denies any fevers or chills.  He states that he tried to have a bowel movement this morning and felt  that it was too large and painful to pass.  He has been trying suppositories at home as well as Dulcolax chews without relief.  On arrival, the patient was vitally stable.  Physical exam generally unremarkable.  Patient presenting with symptoms of fecal impaction and constipation.  Will obtain screening labs and x-ray imaging.  Laboratory evaluation was unremarkable, normal lactic acid.  Patient is passing gas.  Lower suspicion for small bowel obstruction.  X-ray of the abdomen revealed rectal distention with stool.  Rectal exam was performed revealing large amount of stool in the rectum and the patient was subsequently disimpacted per the procedure note above.  XR Abdomen: IMPRESSION:  Small to moderate volume of stool in the colon. Mild rectal  distention with stool.   Patient still feeling some symptoms, requesting enema, performed with good results in the emergency department.  Patient advised continued outpatient management with continued use of MiraLAX, follow-up outpatient with his PCP, return precautions provided.  Stable for discharge at this time.   Final Clinical Impression(s) / ED Diagnoses Final diagnoses:  Fecal impaction in rectum (HCC)  Constipation, unspecified constipation type    Rx / DC Orders ED Discharge Orders     None         Ernie Avena, MD 02/19/24 2010

## 2024-02-19 NOTE — ED Triage Notes (Signed)
 Pt arrived via POV. C/o constipation for 3x days. Home tx not effective.  Cancer pt

## 2024-03-04 ENCOUNTER — Other Ambulatory Visit: Payer: Self-pay | Admitting: Pulmonary Disease

## 2024-03-04 DIAGNOSIS — J310 Chronic rhinitis: Secondary | ICD-10-CM

## 2024-03-09 ENCOUNTER — Ambulatory Visit (HOSPITAL_COMMUNITY)
Admission: RE | Admit: 2024-03-09 | Discharge: 2024-03-09 | Disposition: A | Discharge status: Home / Self Care | Source: Ambulatory Visit | Attending: Internal Medicine | Admitting: Internal Medicine

## 2024-03-09 ENCOUNTER — Inpatient Hospital Stay: Attending: Physician Assistant

## 2024-03-09 DIAGNOSIS — C3490 Malignant neoplasm of unspecified part of unspecified bronchus or lung: Secondary | ICD-10-CM | POA: Insufficient documentation

## 2024-03-09 DIAGNOSIS — C3412 Malignant neoplasm of upper lobe, left bronchus or lung: Secondary | ICD-10-CM | POA: Insufficient documentation

## 2024-03-09 LAB — CBC WITH DIFFERENTIAL (CANCER CENTER ONLY)
Abs Immature Granulocytes: 0.02 10*3/uL (ref 0.00–0.07)
Basophils Absolute: 0 10*3/uL (ref 0.0–0.1)
Basophils Relative: 1 %
Eosinophils Absolute: 0.1 10*3/uL (ref 0.0–0.5)
Eosinophils Relative: 1 %
HCT: 37.3 % — ABNORMAL LOW (ref 39.0–52.0)
Hemoglobin: 12.9 g/dL — ABNORMAL LOW (ref 13.0–17.0)
Immature Granulocytes: 0 %
Lymphocytes Relative: 48 %
Lymphs Abs: 2.8 10*3/uL (ref 0.7–4.0)
MCH: 31.7 pg (ref 26.0–34.0)
MCHC: 34.6 g/dL (ref 30.0–36.0)
MCV: 91.6 fL (ref 80.0–100.0)
Monocytes Absolute: 0.5 10*3/uL (ref 0.1–1.0)
Monocytes Relative: 9 %
Neutro Abs: 2.4 10*3/uL (ref 1.7–7.7)
Neutrophils Relative %: 41 %
Platelet Count: 227 10*3/uL (ref 150–400)
RBC: 4.07 MIL/uL — ABNORMAL LOW (ref 4.22–5.81)
RDW: 18.1 % — ABNORMAL HIGH (ref 11.5–15.5)
WBC Count: 5.9 10*3/uL (ref 4.0–10.5)
nRBC: 0 % (ref 0.0–0.2)

## 2024-03-09 LAB — CMP (CANCER CENTER ONLY)
ALT: 30 U/L (ref 0–44)
AST: 28 U/L (ref 15–41)
Albumin: 4.4 g/dL (ref 3.5–5.0)
Alkaline Phosphatase: 49 U/L (ref 38–126)
Anion gap: 5 (ref 5–15)
BUN: 15 mg/dL (ref 8–23)
CO2: 28 mmol/L (ref 22–32)
Calcium: 9.4 mg/dL (ref 8.9–10.3)
Chloride: 108 mmol/L (ref 98–111)
Creatinine: 0.9 mg/dL (ref 0.61–1.24)
GFR, Estimated: >60 mL/min (ref >60)
Glucose, Bld: 105 mg/dL — ABNORMAL HIGH (ref 70–99)
Potassium: 3.9 mmol/L (ref 3.5–5.1)
Sodium: 141 mmol/L (ref 135–145)
Total Bilirubin: 0.5 mg/dL (ref 0.0–1.2)
Total Protein: 7.3 g/dL (ref 6.5–8.1)

## 2024-03-09 MED ORDER — IOHEXOL 300 MG/ML  SOLN
75.0000 mL | Freq: Once | INTRAMUSCULAR | Status: AC | PRN
  Administered 2024-03-09: 75 mL via INTRAVENOUS

## 2024-03-09 MED ORDER — SODIUM CHLORIDE (PF) 0.9 % IJ SOLN
INTRAMUSCULAR | Status: AC
  Filled 2024-03-09: qty 50

## 2024-03-13 ENCOUNTER — Other Ambulatory Visit (HOSPITAL_COMMUNITY)

## 2024-03-13 ENCOUNTER — Other Ambulatory Visit

## 2024-03-16 ENCOUNTER — Inpatient Hospital Stay: Attending: Physician Assistant | Admitting: Internal Medicine

## 2024-03-16 ENCOUNTER — Encounter: Payer: Self-pay | Admitting: Internal Medicine

## 2024-03-16 VITALS — BP 131/50 | HR 65 | Temp 98.3°F | Resp 18 | Ht 74.5 in | Wt 246.3 lb

## 2024-03-16 DIAGNOSIS — R079 Chest pain, unspecified: Secondary | ICD-10-CM | POA: Insufficient documentation

## 2024-03-16 DIAGNOSIS — C3412 Malignant neoplasm of upper lobe, left bronchus or lung: Secondary | ICD-10-CM | POA: Diagnosis present

## 2024-03-16 DIAGNOSIS — C3402 Malignant neoplasm of left main bronchus: Secondary | ICD-10-CM

## 2024-03-16 DIAGNOSIS — R5383 Other fatigue: Secondary | ICD-10-CM | POA: Insufficient documentation

## 2024-03-16 NOTE — Progress Notes (Signed)
 [facility] [phone]   [phone]  OFFICE PROGRESS NOTE  [name], PA-C [address] [place] [address]  DIAGNOSIS: Stage IIIA (T3, N2, M0) non-small cell lung cancer, favoring adenocarcinoma presented with left hilar soft tissue mass in addition to left hilar and mediastinal lymphadenopathy diagnosed in January 2025.   PRIOR THERAPY: Neoadjuvant systemic chemotherapy with carboplatin  for AUC of 5, Alimta 500 Mg/M2 and nivolumab  360 Mg IV every 3 weeks.  First dose December 30, 2023.  Status post 3 cycles.  CURRENT THERAPY: Referral for surgical evaluation.  INTERVAL HISTORY: Jackson Powers 78 y.o. male returns to the clinic today for follow-up visit accompanied by his wife.Discussed the use of AI scribe software for clinical note transcription with the patient, who gave verbal consent to proceed.  History of Present Illness   Jackson Powers is a 78 year old male with stage IIIA non-small cell lung cancer who presents for evaluation and repeat CT scan for restaging after neoadjuvant therapy. He is accompanied by his wife and daughter.  Diagnosed with stage IIIA non-small cell lung cancer, favoring adenocarcinoma, in January 2025, he has completed three cycles of neoadjuvant systemic chemoimmunotherapy with carboplatin , Alimta, and nivolumab .  He tolerated the last cycle of chemotherapy well, without nausea, vomiting, or diarrhea. However, he experiences increased fatigue and some chest pain. His breathing is adequate, but he tires more easily.  A recent CT scan shows a decrease in the size of the mass in the left upper lobe. There is also a stable nodule in the right lung. on Previous PET scan the right upper lobe subpleural nodule measures 1.1 by 0.9 cm with an SUV of 1.4. The left lung mass previously had an SUV of 5.6.  He previously underwent a pulmonary function test in November 2024, which may need to be repeated for further  assessment.  No nausea, vomiting, or diarrhea. Increased fatigue and some chest pain. Breathing is 'okay', but he tires out more easily.       MEDICAL HISTORY: Past Medical History:  Diagnosis Date   Acute gastritis without hemorrhage 2007   Arthritis    osteoarthritis    Benign neoplasm of colon 2007   COPD (chronic obstructive pulmonary disease) (HCC)    Esophagitis 2007   Hypertension    Orthopnea    reports " i have trouble breathing when i lie completely flat. i have to use 2 people to prop my head up"    Panic anxiety syndrome    patient reports having severe panic anxiety and claustraphobia. states with ear surgery years ago , they had to tie him down which caused him to panic even more;    Right BBB/left ant fasc block    (OLD)   TIA (transient ischemic attack)     ALLERGIES:  is allergic to fosaprepitant .  MEDICATIONS:  Current Outpatient Medications  Medication Sig Dispense Refill   amLODipine  (NORVASC ) 10 MG tablet Take 10 mg by mouth daily.     clopidogrel  (PLAVIX ) 75 MG tablet Take 75 mg by mouth daily.     fluticasone  (FLONASE ) 50 MCG/ACT nasal spray SPRAY 1 SPRAY INTO BOTH NOSTRILS DAILY. 16 mL 2   Fluticasone -Umeclidin-Vilant (TRELEGY ELLIPTA ) 100-62.5-25 MCG/ACT AEPB TAKE 1 PUFF BY MOUTH EVERY DAY (Patient not taking: Reported on 12/14/2023) 60 each 3   folic acid  (FOLVITE ) 1 MG tablet Take 1 tablet (1 mg total) by mouth daily. Start 7 days before pemetrexed  chemotherapy. Continue until  21 days after pemetrexed  completed. 100 tablet 3   metoprolol succinate (TOPROL-XL) 25 MG 24 hr tablet Take 25 mg by mouth daily.     ondansetron  (ZOFRAN ) 8 MG tablet Take 1 tablet (8 mg total) by mouth every 8 (eight) hours as needed for nausea or vomiting. Start on the third day after carboplatin . 30 tablet 1   prochlorperazine  (COMPAZINE ) 10 MG tablet Take 1 tablet (10 mg total) by mouth every 6 (six) hours as needed for nausea or vomiting. 30 tablet 1   No current  facility-administered medications for this visit.   Facility-Administered Medications Ordered in Other Visits  Medication Dose Route Frequency Provider Last Rate Last Admin   sodium chloride  flush (NS) 0.9 % injection 3 mL  3 mL Intravenous Q12H Cherrie Cornwall, MD        SURGICAL HISTORY:  Past Surgical History:  Procedure Laterality Date   BRONCHIAL BIOPSY  12/02/2023   Procedure: BRONCHIAL BIOPSIES;  Surgeon: Vergia Glasgow, MD;  Location: [facility];  Service: Pulmonary;;   BRONCHIAL BRUSHINGS  12/02/2023   Procedure: BRONCHIAL BRUSHINGS;  Surgeon: Vergia Glasgow, MD;  [facility]: [facility];  Service: Pulmonary;;   BRONCHIAL NEEDLE ASPIRATION BIOPSY  12/02/2023   Procedure: BRONCHIAL NEEDLE ASPIRATION BIOPSIES;  Surgeon: Vergia Glasgow, MD;  [facility]: [facility];  Service: Pulmonary;;   BRONCHIAL WASHINGS  12/02/2023   Procedure: BRONCHIAL WASHINGS;  Surgeon: Vergia Glasgow, MD;  [facility]: [facility];  Service: Pulmonary;;   LEFT HEART CATH AND CORONARY ANGIOGRAPHY Left 10/01/2020   Procedure: LEFT HEART CATH AND CORONARY ANGIOGRAPHY;  Surgeon: Cherrie Cornwall, MD;  Location: [facility];  Service: Cardiovascular;  Laterality: Left;   SKIN CANCER EXCISION Right 2017   EAR; FAILED GRAFT FOLLWOING SURGERY    TOTAL HIP ARTHROPLASTY Right 07/20/2018   Procedure: RIGHT TOTAL HIP ARTHROPLASTY ANTERIOR APPROACH;  Surgeon: Liliane Rei, MD;  Location: [facility];  Service: Orthopedics;  Laterality: Right;   VIDEO BRONCHOSCOPY WITH ENDOBRONCHIAL ULTRASOUND  12/02/2023   Procedure: VIDEO BRONCHOSCOPY WITH ENDOBRONCHIAL ULTRASOUND;  Surgeon: Vergia Glasgow, MD;  Location: [facility];  Service: Pulmonary;;    REVIEW OF SYSTEMS:  Constitutional: positive for fatigue Eyes: negative Ears, nose, mouth, throat, and face: negative Respiratory: negative Cardiovascular: negative Gastrointestinal: positive for nausea Genitourinary:negative Integument/breast:  negative Hematologic/lymphatic: negative Musculoskeletal:negative Neurological: negative Behavioral/Psych: negative Endocrine: negative Allergic/Immunologic: negative   PHYSICAL EXAMINATION: General appearance: alert, cooperative, fatigued, and no distress Head: Normocephalic, without obvious abnormality, atraumatic Neck: no adenopathy, no JVD, supple, symmetrical, trachea midline, and thyroid  not enlarged, symmetric, no tenderness/mass/nodules Lymph nodes: Cervical, supraclavicular, and axillary nodes normal. Resp: clear to auscultation bilaterally Back: symmetric, no curvature. ROM normal. No CVA tenderness. Cardio: regular rate and rhythm, S1, S2 normal, no murmur, click, rub or gallop GI: soft, non-tender; bowel sounds normal; no masses,  no organomegaly Extremities: extremities normal, atraumatic, no cyanosis or edema Neurologic: Alert and oriented X 3, normal strength and tone. Normal symmetric reflexes. Normal coordination and gait  ECOG PERFORMANCE STATUS: 1 - Symptomatic but completely ambulatory  Blood pressure (!) 131/50, pulse 65, temperature 98.3 F (36.8 C), temperature source Temporal, resp. rate 18, height 6' 2.5" (1.892 m), weight 246 lb 4.8 oz (111.7 kg), SpO2 98%.  LABORATORY DATA: Lab Results  Component Value Date   WBC 5.9 03/09/2024   HGB 12.9 (L) 03/09/2024   HCT 37.3 (L) 03/09/2024   MCV 91.6 03/09/2024   PLT 227 03/09/2024      Chemistry  Component Value Date/Time   NA 141 03/09/2024 0752   K 3.9 03/09/2024 0752   CL 108 03/09/2024 0752   CO2 28 03/09/2024 0752   BUN 15 03/09/2024 0752   CREATININE 0.90 03/09/2024 0752      Component Value Date/Time   CALCIUM  9.4 03/09/2024 0752   ALKPHOS 49 03/09/2024 0752   AST 28 03/09/2024 0752   ALT 30 03/09/2024 0752   BILITOT 0.5 03/09/2024 0752       RADIOGRAPHIC STUDIES: CT Chest W Contrast Result Date: 03/15/2024 CLINICAL DATA:  Restaging non-small cell lung cancer. * Tracking Code: BO *  EXAM: CT CHEST WITH CONTRAST TECHNIQUE: Multidetector CT imaging of the chest was performed during intravenous contrast administration. RADIATION DOSE REDUCTION: This exam was performed according to the departmental dose-optimization program which includes automated exposure control, adjustment of the mA and/or kV according to patient size and/or use of iterative reconstruction technique. CONTRAST:  75mL OMNIPAQUE  IOHEXOL  300 MG/ML  SOLN COMPARISON:  PET-CT 10/19/2024 and chest CT 11/30/2023 FINDINGS: Cardiovascular: The heart is normal in size. No pericardial effusion. Stable aortic and three-vessel coronary artery calcifications. The pulmonary arteries are normal. Mediastinum/Nodes: Stable cluster of small right hilar nodes. Stable soft tissue density in the left hilar area without discrete measurable node. No mediastinal adenopathy. Small scattered right paratracheal and AP window nodes are unchanged. The esophagus is unremarkable. Lungs/Pleura: The left hilar/medial left upper lobe lesion has decreased in size since the prior CT scan. It measures approximately 5.5 x 1.7 cm and previously measured 6.3 x 2.2 cm. Stable 12 mm smoothly marginated well circumscribed subpleural nodule in the right upper lobe image number 72/5 no new pulmonary nodules to suggest metastatic disease. Stable scarring changes in the left upper lobe and left lower lobe which could be related to radiation treatment. No pleural effusions or pleural nodules. The central tracheobronchial tree is unremarkable. Upper Abdomen: No significant upper abdominal findings. No hepatic adrenal gland lesions. No upper abdominal adenopathy. Stable vascular calcifications. Musculoskeletal: No chest wall mass, supraclavicular or axillary adenopathy. No thyroid  gland lesions. The bony thorax is intact. No bone lesions or fractures. IMPRESSION: 1. Interval decrease in size of the left hilar/medial left upper lobe lesion. 2. Stable 12 mm smoothly marginated well  circumscribed subpleural nodule in the right upper lobe. 3. No new pulmonary nodules to suggest metastatic disease. 4. Stable scarring changes in the left upper lobe and left lower lobe which could be related to radiation treatment. 5. Stable small mediastinal and bilateral hilar nodes. 6. No findings for upper abdominal metastatic disease. 7. Aortic and coronary artery atherosclerosis. Aortic Atherosclerosis (ICD10-I70.0). Electronically Signed   By: Marrian Siva M.D.   On: 03/15/2024 20:00   DG Abdomen 1 View Result Date: 02/19/2024 CLINICAL DATA:  Constipation. EXAM: ABDOMEN - 1 VIEW COMPARISON:  None Available. FINDINGS: No bowel dilatation or evidence of obstruction. Small to moderate volume of stool in the colon. Mild rectal distention with stool, 5.6 cm. Phleboliths in the pelvis. Included lung bases are clear. Right hip arthroplasty. IMPRESSION: Small to moderate volume of stool in the colon. Mild rectal distention with stool. Electronically Signed   By: Chadwick Colonel M.D.   On: 02/19/2024 18:25     ASSESSMENT AND PLAN: This is a very pleasant 78 years old white male with Stage IIIA (T3, N2, M0) non-small cell lung cancer, favoring adenocarcinoma presented with left hilar soft tissue mass in addition to left hilar and mediastinal lymphadenopathy diagnosed in January 2025.  He  underwent neoadjuvant systemic chemotherapy with carboplatin  for AUC of 5, Alimta 500 Mg/M2 and nivolumab  360 Mg IV every 3 weeks status post 3 cycles.  He has been tolerating this treatment fairly well. The patient had repeat CT scan of the chest performed recently.  I personally and independently reviewed the scan images and discussed the result and showed the images to the patient and his family.  He has interval decrease in the size of the left hilar/medial left upper lobe lesion. I had a lengthy discussion with the patient and his family about his condition.    Stage 3A non-small cell lung cancer Stage 3A non-small  cell lung cancer, favoring adenocarcinoma, located in the left upper lobe. The mass has decreased in size following three cycles of neoadjuvant systemic chemoimmunotherapy with carboplatin , Alimta, and nivolumab . The mass is centrally located, complicating surgical options due to potential attachment to major blood vessels and other structures in the chest. The treatment goal is curative, with the possibility of surgery or chemoradiation depending on surgical feasibility. If surgery is not feasible, chemoradiation will be pursued with mild chemotherapy and radiation Monday through Friday for six and a half weeks. The mass's response to treatment is promising, but the decision for surgery depends on the surgeon's assessment of resectability. - Refer to Dr. Luna Salinas for surgical evaluation. - Repeat pulmonary function test if surgery is considered feasible. - If surgery is not feasible, initiate chemoradiation with mild chemotherapy and radiation Monday through Friday for six and a half weeks. - Reassess with a scan after chemoradiation.  Right lung nodule Right lung nodule in the right upper lobe, measuring 1.1 x 0.9 cm with a low SUV of 1.4 on PET scan, favoring a benign etiology. The nodule has been stable over time, and there is no significant activity on PET scan compared to the left lung mass. The decision to biopsy or treat with radiation will depend on further assessment and the overall treatment plan for the left lung mass. - Consider biopsy of the right lung nodule if clinically indicated. - Monitor the right lung nodule for changes in size or activity.   The patient was advised to call immediately if he has any other concerning symptoms in the interval. The patient voices understanding of current disease status and treatment options and is in agreement with the current care plan.  All questions were answered. The patient knows to call the clinic with any problems, questions or concerns. We  can certainly see the patient much sooner if necessary.  The total time spent in the appointment was 30 minutes.  Disclaimer: This note was dictated with voice recognition software. Similar sounding words can inadvertently be transcribed and may not be corrected upon review.

## 2024-03-17 ENCOUNTER — Other Ambulatory Visit: Payer: Self-pay

## 2024-03-22 ENCOUNTER — Ambulatory Visit: Admitting: Internal Medicine

## 2024-03-23 ENCOUNTER — Ambulatory Visit
Attending: Thoracic Surgery (Cardiothoracic Vascular Surgery) | Admitting: Thoracic Surgery (Cardiothoracic Vascular Surgery)

## 2024-03-23 ENCOUNTER — Telehealth: Payer: Self-pay

## 2024-03-23 VITALS — BP 150/69 | HR 76 | Resp 20 | Ht 74.5 in | Wt 248.0 lb

## 2024-03-23 DIAGNOSIS — C3492 Malignant neoplasm of unspecified part of left bronchus or lung: Secondary | ICD-10-CM

## 2024-03-23 NOTE — Telephone Encounter (Signed)
 Received a TC from the patient's daughter. She reported that the patient saw Dr. Luna Salinas, who informed them that the patient is not a candidate for surgery. The daughter mentioned that they are looking to schedule upcoming appointments for the new plan.  Informed the daughter that I would relay this information to Dr. Marguerita Shih for review. Also advised that scheduling will follow up with her regarding upcoming appointments. Daughter voiced understanding.

## 2024-03-23 NOTE — Progress Notes (Deleted)
 [address]       [place] [address]             [phone]                    [name] Medical Record #[id number] Date of Birth: 1946/03/16  Referring: [name], PA-C Primary Care: [name], PA-C Primary Cardiologist: None  Chief Complaint:   No chief complaint on file.   History of Present Illness:    Jackson Powers 78 y.o. male presents for surgical evaluation of a ***    Smoking Hx: ***      Past Medical History:  Diagnosis Date   Acute gastritis without hemorrhage 2007   Arthritis    osteoarthritis    Benign neoplasm of colon 2007   COPD (chronic obstructive pulmonary disease) (HCC)    Esophagitis 2007   Hypertension    Orthopnea    reports " i have trouble breathing when i lie completely flat. i have to use 2 people to prop my head up"    Panic anxiety syndrome    patient reports having severe panic anxiety and claustraphobia. states with ear surgery years ago , they had to tie him down which caused him to panic even more;    Right BBB/left ant fasc block    (OLD)   TIA (transient ischemic attack)     Past Surgical History:  Procedure Laterality Date   BRONCHIAL BIOPSY  12/02/2023   Procedure: BRONCHIAL BIOPSIES;  Surgeon: Vergia Glasgow, MD;  [facility]: [facility];  Service: Pulmonary;;   BRONCHIAL BRUSHINGS  12/02/2023   Procedure: BRONCHIAL BRUSHINGS;  Surgeon: Vergia Glasgow, MD;  [facility]: [facility];  Service: Pulmonary;;   BRONCHIAL NEEDLE ASPIRATION BIOPSY  12/02/2023   Procedure: BRONCHIAL NEEDLE ASPIRATION BIOPSIES;  Surgeon: Vergia Glasgow, MD;  [facility]: [facility];  Service: Pulmonary;;   BRONCHIAL WASHINGS  12/02/2023   Procedure: BRONCHIAL WASHINGS;  Surgeon: Vergia Glasgow, MD;  [facility]: [facility];  Service: Pulmonary;;   LEFT HEART CATH AND CORONARY ANGIOGRAPHY Left 10/01/2020   Procedure: LEFT HEART CATH AND CORONARY ANGIOGRAPHY;  Surgeon: Cherrie Cornwall, MD;  Location: [facility];  Service: Cardiovascular;  Laterality: Left;   SKIN CANCER EXCISION Right 2017   EAR; FAILED GRAFT FOLLWOING SURGERY    TOTAL HIP ARTHROPLASTY Right 07/20/2018   Procedure: RIGHT TOTAL HIP ARTHROPLASTY ANTERIOR APPROACH;  Surgeon: Liliane Rei, MD;  Location: [facility];  Service: Orthopedics;  Laterality: Right;   VIDEO BRONCHOSCOPY WITH ENDOBRONCHIAL ULTRASOUND  12/02/2023   Procedure: VIDEO BRONCHOSCOPY WITH ENDOBRONCHIAL ULTRASOUND;  Surgeon: Vergia Glasgow, MD;  Location: [facility];  Service: Pulmonary;;    No family history on file.   Social History   Tobacco Use  Smoking Status Former   Current packs/day: 0.00   Average packs/day: 2.0 packs/day for 15.0 years (30.0 ttl pk-yrs)   Types: Cigarettes   Start date: 60   Quit date: 1989   Years since quitting: 36.3  Smokeless Tobacco Never    Social History   Substance and Sexual Activity  Alcohol Use Not Currently     Allergies  Allergen Reactions   Fosaprepitant  Shortness Of Breath    SOB, back pain and mild flushing    Current Outpatient Medications  Medication Sig Dispense Refill   amLODipine  (NORVASC ) 10 MG tablet Take 10 mg by mouth daily.     clopidogrel  (PLAVIX ) 75 MG tablet Take 75 mg by  mouth daily.     fluticasone  (FLONASE ) 50 MCG/ACT nasal spray SPRAY 1 SPRAY INTO BOTH NOSTRILS DAILY. 16 mL 2   Fluticasone -Umeclidin-Vilant (TRELEGY ELLIPTA ) 100-62.5-25 MCG/ACT AEPB TAKE 1 PUFF BY MOUTH EVERY DAY (Patient not taking: Reported on 12/14/2023) 60 each 3   folic acid  (FOLVITE ) 1 MG tablet Take 1 tablet (1 mg total) by mouth daily. Start 7 days before pemetrexed  chemotherapy. Continue until 21 days after pemetrexed  completed. 100 tablet 3   metoprolol succinate (TOPROL-XL) 25 MG 24 hr tablet Take 25 mg by mouth daily.     ondansetron  (ZOFRAN ) 8 MG tablet Take 1 tablet (8 mg total) by mouth every 8 (eight) hours as needed for nausea or vomiting. Start on the third day after carboplatin . 30 tablet 1    prochlorperazine  (COMPAZINE ) 10 MG tablet Take 1 tablet (10 mg total) by mouth every 6 (six) hours as needed for nausea or vomiting. 30 tablet 1   No current facility-administered medications for this visit.   Facility-Administered Medications Ordered in Other Visits  Medication Dose Route Frequency Provider Last Rate Last Admin   sodium chloride  flush (NS) 0.9 % injection 3 mL  3 mL Intravenous Q12H Debborah Fairly A, MD        ROS   PHYSICAL EXAMINATION: There were no vitals taken for this visit. Physical Exam       I have independently reviewed the above radiology studies  and reviewed the findings with the patient.   Recent Lab Findings: Lab Results  Component Value Date   WBC 5.9 03/09/2024   HGB 12.9 (L) 03/09/2024   HCT 37.3 (L) 03/09/2024   PLT 227 03/09/2024   GLUCOSE 105 (H) 03/09/2024   CHOL 103 12/18/2020   TRIG 66 12/18/2020   HDL 42 12/18/2020   LDLCALC 48 12/18/2020   ALT 30 03/09/2024   AST 28 03/09/2024   NA 141 03/09/2024   K 3.9 03/09/2024   CL 108 03/09/2024   CREATININE 0.90 03/09/2024   BUN 15 03/09/2024   CO2 28 03/09/2024   TSH 1.304 02/10/2024   INR 1.1 12/17/2020   HGBA1C 5.7 (H) 12/18/2020    Diagnostic Studies & Laboratory data:     Recent Radiology Findings:   CT Chest W Contrast Result Date: 03/15/2024 CLINICAL DATA:  Restaging non-small cell lung cancer. * Tracking Code: BO * EXAM: CT CHEST WITH CONTRAST TECHNIQUE: Multidetector CT imaging of the chest was performed during intravenous contrast administration. RADIATION DOSE REDUCTION: This exam was performed according to the departmental dose-optimization program which includes automated exposure control, adjustment of the mA and/or kV according to patient size and/or use of iterative reconstruction technique. CONTRAST:  75mL OMNIPAQUE  IOHEXOL  300 MG/ML  SOLN COMPARISON:  PET-CT 10/19/2024 and chest CT 11/30/2023 FINDINGS: Cardiovascular: The heart is normal in size. No pericardial  effusion. Stable aortic and three-vessel coronary artery calcifications. The pulmonary arteries are normal. Mediastinum/Nodes: Stable cluster of small right hilar nodes. Stable soft tissue density in the left hilar area without discrete measurable node. No mediastinal adenopathy. Small scattered right paratracheal and AP window nodes are unchanged. The esophagus is unremarkable. Lungs/Pleura: The left hilar/medial left upper lobe lesion has decreased in size since the prior CT scan. It measures approximately 5.5 x 1.7 cm and previously measured 6.3 x 2.2 cm. Stable 12 mm smoothly marginated well circumscribed subpleural nodule in the right upper lobe image number 72/5 no new pulmonary nodules to suggest metastatic disease. Stable scarring changes in the left upper lobe and  left lower lobe which could be related to radiation treatment. No pleural effusions or pleural nodules. The central tracheobronchial tree is unremarkable. Upper Abdomen: No significant upper abdominal findings. No hepatic adrenal gland lesions. No upper abdominal adenopathy. Stable vascular calcifications. Musculoskeletal: No chest wall mass, supraclavicular or axillary adenopathy. No thyroid  gland lesions. The bony thorax is intact. No bone lesions or fractures. IMPRESSION: 1. Interval decrease in size of the left hilar/medial left upper lobe lesion. 2. Stable 12 mm smoothly marginated well circumscribed subpleural nodule in the right upper lobe. 3. No new pulmonary nodules to suggest metastatic disease. 4. Stable scarring changes in the left upper lobe and left lower lobe which could be related to radiation treatment. 5. Stable small mediastinal and bilateral hilar nodes. 6. No findings for upper abdominal metastatic disease. 7. Aortic and coronary artery atherosclerosis. Aortic Atherosclerosis (ICD10-I70.0). Electronically Signed   By: Marrian Siva M.D.   On: 03/15/2024 20:00     PFTs:  - FVC: ***% - FEV1: ***% -DLCO:  ***%    Assessment / Plan:   ***     I  spent {CHL ONC TIME VISIT - ZOXWR:[id number]} with  the patient face to face in counseling and coordination of care.    Hilarie Lovely 03/23/2024 8:33 AM

## 2024-03-23 NOTE — Progress Notes (Signed)
 PCP is [name], PA-C Referring Provider is [name], PA-C  Chief Complaint  Patient presents with   Lung Cancer    Surgical consult, Chest CT 03/09/24/ MR Brain 12/20/23/ Bronch 12/02/23/ PET Scan 10/20/23/ PFT's 10/05/23     HPI: Mr. Schiano is sent for consultation regarding a non-small cell carcinoma of the left upper lobe.  Eliott Amor is a 78 year old man with a past medical history significant for stage IIIB (T3, N2) non-small cell carcinoma of the lung, remote tobacco use, coronary calcification, TIA, hypertension, esophagitis, and osteoarthritis.  He originally presented last year and with cough and congestion.  He had a CT scan which showed a well-circumscribed nodule in the right upper lobe.  There was some perihilar "airspace disease" in the lingula.  A follow-up CT in September showed some progression of the perihilar nodular opacity measuring 6.2 x 2.4 cm.  PET/CT in December showed the area was hypermetabolic.  There also was left hilar, subcarinal, and right hilar adenopathy.  He underwent navigational bronchoscopy and endobronchial ultrasound.  Biopsy showed non-small cell carcinoma.  A level 7 node was positive.  He was started on neoadjuvant chemoimmunotherapy.  He had 3 cycles of carboplatin  and Alimta and nivolumab .  Finished treatment about a month ago.  Repeat scan showed good partial radiographic response.  He has about a 30-pack-year history of smoking but quit in 1989.  Tolerated chemo well.  Has some fatigue.  No weight loss.  Denies chest pain, pressure, tightness, or shortness of breath.  Past Medical History:  Diagnosis Date   Acute gastritis without hemorrhage 2007   Arthritis    osteoarthritis    Benign neoplasm of colon 2007   COPD (chronic obstructive pulmonary disease) (HCC)    Esophagitis 2007   Hypertension    Orthopnea    reports " i have trouble breathing when i lie completely flat. i have to use 2 people to prop my head up"    Panic anxiety  syndrome    patient reports having severe panic anxiety and claustraphobia. states with ear surgery years ago , they had to tie him down which caused him to panic even more;    Right BBB/left ant fasc block    (OLD)   TIA (transient ischemic attack)     Past Surgical History:  Procedure Laterality Date   BRONCHIAL BIOPSY  12/02/2023   Procedure: BRONCHIAL BIOPSIES;  Surgeon: Vergia Glasgow, MD;  Location: [facility];  Service: Pulmonary;;   BRONCHIAL BRUSHINGS  12/02/2023   Procedure: BRONCHIAL BRUSHINGS;  Surgeon: Vergia Glasgow, MD;  Location: [facility];  Service: Pulmonary;;   BRONCHIAL NEEDLE ASPIRATION BIOPSY  12/02/2023   Procedure: BRONCHIAL NEEDLE ASPIRATION BIOPSIES;  Surgeon: Vergia Glasgow, MD;  Location: [facility];  Service: Pulmonary;;   BRONCHIAL WASHINGS  12/02/2023   Procedure: BRONCHIAL WASHINGS;  Surgeon: Vergia Glasgow, MD;  Location: [facility];  Service: Pulmonary;;   LEFT HEART CATH AND CORONARY ANGIOGRAPHY Left 10/01/2020   Procedure: LEFT HEART CATH AND CORONARY ANGIOGRAPHY;  Surgeon: Cherrie Cornwall, MD;  Location: [facility];  Service: Cardiovascular;  Laterality: Left;   SKIN CANCER EXCISION Right 2017   EAR; FAILED GRAFT FOLLWOING SURGERY    TOTAL HIP ARTHROPLASTY Right 07/20/2018   Procedure: RIGHT TOTAL HIP ARTHROPLASTY ANTERIOR APPROACH;  Surgeon: Liliane Rei, MD;  Location: [facility];  Service: Orthopedics;  Laterality: Right;   VIDEO BRONCHOSCOPY WITH ENDOBRONCHIAL ULTRASOUND  12/02/2023   Procedure: VIDEO BRONCHOSCOPY WITH ENDOBRONCHIAL ULTRASOUND;  Surgeon: Darnelle Elders,  Berneta Brightly, MD;  Location: [facility];  Service: Pulmonary;;    No family history on file.  Social History Social History   Tobacco Use   Smoking status: Former    Current packs/day: 0.00    Average packs/day: 2.0 packs/day for 15.0 years (30.0 ttl pk-yrs)    Types: Cigarettes    Start date: 84    Quit date: 1989    Years since quitting: 36.3   Smokeless tobacco: Never   Vaping Use   Vaping status: Never Used  Substance Use Topics   Alcohol use: Not Currently   Drug use: Never    Current Outpatient Medications  Medication Sig Dispense Refill   amLODipine  (NORVASC ) 10 MG tablet Take 10 mg by mouth daily.     clopidogrel  (PLAVIX ) 75 MG tablet Take 75 mg by mouth daily.     fluticasone  (FLONASE ) 50 MCG/ACT nasal spray SPRAY 1 SPRAY INTO BOTH NOSTRILS DAILY. 16 mL 2   Fluticasone -Umeclidin-Vilant (TRELEGY ELLIPTA ) 100-62.5-25 MCG/ACT AEPB TAKE 1 PUFF BY MOUTH EVERY DAY 60 each 3   folic acid  (FOLVITE ) 1 MG tablet Take 1 tablet (1 mg total) by mouth daily. Start 7 days before pemetrexed  chemotherapy. Continue until 21 days after pemetrexed  completed. 100 tablet 3   metoprolol succinate (TOPROL-XL) 25 MG 24 hr tablet Take 25 mg by mouth daily.     ondansetron  (ZOFRAN ) 8 MG tablet Take 1 tablet (8 mg total) by mouth every 8 (eight) hours as needed for nausea or vomiting. Start on the third day after carboplatin . 30 tablet 1   prochlorperazine  (COMPAZINE ) 10 MG tablet Take 1 tablet (10 mg total) by mouth every 6 (six) hours as needed for nausea or vomiting. 30 tablet 1   No current facility-administered medications for this visit.   Facility-Administered Medications Ordered in Other Visits  Medication Dose Route Frequency Provider Last Rate Last Admin   sodium chloride  flush (NS) 0.9 % injection 3 mL  3 mL Intravenous Q12H Cherrie Cornwall, MD        Allergies  Allergen Reactions   Fosaprepitant  Shortness Of Breath    SOB, back pain and mild flushing    Review of Systems  Constitutional:  Positive for activity change and fatigue. Negative for unexpected weight change.  HENT:  Negative for trouble swallowing and voice change.   Respiratory:  Negative for shortness of breath.   Cardiovascular:  Negative for chest pain.  Gastrointestinal:  Negative for abdominal distention and abdominal pain.  Neurological:  Negative for seizures, weakness and headaches.   Hematological:  Does not bruise/bleed easily.    BP (!) 150/69   Pulse 76   Resp 20   Ht 6' 2.5" (1.892 m)   Wt 248 lb (112.5 kg)   SpO2 95% Comment: RA  BMI 31.42 kg/m  Physical Exam Vitals reviewed.  Eyes:     General: No scleral icterus.    Extraocular Movements: Extraocular movements intact.  Cardiovascular:     Rate and Rhythm: Normal rate and regular rhythm.     Heart sounds: Normal heart sounds. No murmur heard.    No friction rub. No gallop.  Pulmonary:     Effort: Pulmonary effort is normal. No respiratory distress.     Breath sounds: Normal breath sounds.  Abdominal:     General: There is no distension.     Palpations: Abdomen is soft.  Musculoskeletal:     Cervical back: Normal range of motion.  Lymphadenopathy:     Cervical: No cervical  adenopathy.  Skin:    General: Skin is warm and dry.  Neurological:     General: No focal deficit present.     Mental Status: He is alert and oriented to person, place, and time.     Cranial Nerves: No cranial nerve deficit.     Motor: No weakness.    Diagnostic Tests: CT CHEST WITH CONTRAST   TECHNIQUE: Multidetector CT imaging of the chest was performed during intravenous contrast administration.   RADIATION DOSE REDUCTION: This exam was performed according to the departmental dose-optimization program which includes automated exposure control, adjustment of the mA and/or kV according to patient size and/or use of iterative reconstruction technique.   CONTRAST:  75mL OMNIPAQUE  IOHEXOL  300 MG/ML  SOLN   COMPARISON:  PET-CT 10/19/2024 and chest CT 11/30/2023   FINDINGS: Cardiovascular: The heart is normal in size. No pericardial effusion. Stable aortic and three-vessel coronary artery calcifications. The pulmonary arteries are normal.   Mediastinum/Nodes: Stable cluster of small right hilar nodes. Stable soft tissue density in the left hilar area without discrete measurable node. No mediastinal adenopathy.  Small scattered right paratracheal and AP window nodes are unchanged. The esophagus is unremarkable.   Lungs/Pleura: The left hilar/medial left upper lobe lesion has decreased in size since the prior CT scan. It measures approximately 5.5 x 1.7 cm and previously measured 6.3 x 2.2 cm. Stable 12 mm smoothly marginated well circumscribed subpleural nodule in the right upper lobe image number 72/5 no new pulmonary nodules to suggest metastatic disease. Stable scarring changes in the left upper lobe and left lower lobe which could be related to radiation treatment. No pleural effusions or pleural nodules. The central tracheobronchial tree is unremarkable.   Upper Abdomen: No significant upper abdominal findings. No hepatic adrenal gland lesions. No upper abdominal adenopathy. Stable vascular calcifications.   Musculoskeletal: No chest wall mass, supraclavicular or axillary adenopathy. No thyroid  gland lesions. The bony thorax is intact. No bone lesions or fractures.   IMPRESSION: 1. Interval decrease in size of the left hilar/medial left upper lobe lesion. 2. Stable 12 mm smoothly marginated well circumscribed subpleural nodule in the right upper lobe. 3. No new pulmonary nodules to suggest metastatic disease. 4. Stable scarring changes in the left upper lobe and left lower lobe which could be related to radiation treatment. 5. Stable small mediastinal and bilateral hilar nodes. 6. No findings for upper abdominal metastatic disease. 7. Aortic and coronary artery atherosclerosis.   Aortic Atherosclerosis (ICD10-I70.0).     Electronically Signed   By: Marrian Siva M.D.   On: 03/15/2024 20:00 I personally reviewed the CT images from September 2024 and April 2025 and also the PET/CT images from December 2024.  There is a perihilar left upper lobe mass measuring 5.5 x 1.7 cm.  Suspicious but not definitive for mediastinal invasion.  Impression: Ajeet Avalon is a 78 year old man with  a past medical history significant for stage IIIB (T3, N2) non-small cell carcinoma of the lung, remote tobacco use, coronary calcification, TIA, hypertension, esophagitis, and osteoarthritis.  Stage IIIb (T3, N2) non-small cell carcinoma of the lung-I reviewed Mr. Truszkowski's CT scan.  He is having good partial response to neoadjuvant chemoimmunotherapy.  Unfortunately with the location of the nodule it would still require pneumonectomy.  Not even sure that it is resectable because I think there is probably mediastinal invasion.  That is appropriate as 78 year old with N2 disease particularly from the left as we would not be able to necessarily reach that subcarinal  node.  After long discussion, my recommendation to Mr. Kath was to continue with chemoimmunotherapy and also radiation.  He and his family were in agreement with that plan.  Plan: Follow-up with Dr. Eliane Grooms, MD [facility] [phone]

## 2024-03-24 ENCOUNTER — Telehealth: Payer: Self-pay | Admitting: Internal Medicine

## 2024-03-24 ENCOUNTER — Encounter: Admitting: Thoracic Surgery (Cardiothoracic Vascular Surgery)

## 2024-03-24 NOTE — Telephone Encounter (Signed)
 Scheduled appointments with the patient. The patient is aware of the appointment details.

## 2024-03-27 ENCOUNTER — Other Ambulatory Visit: Payer: Self-pay | Admitting: Internal Medicine

## 2024-03-27 DIAGNOSIS — C3411 Malignant neoplasm of upper lobe, right bronchus or lung: Secondary | ICD-10-CM

## 2024-03-29 ENCOUNTER — Encounter: Payer: Self-pay | Admitting: Pulmonary Disease

## 2024-03-29 ENCOUNTER — Ambulatory Visit (INDEPENDENT_AMBULATORY_CARE_PROVIDER_SITE_OTHER): Admitting: Pulmonary Disease

## 2024-03-29 VITALS — BP 128/78 | HR 80 | Ht 75.0 in | Wt 248.8 lb

## 2024-03-29 DIAGNOSIS — C3492 Malignant neoplasm of unspecified part of left bronchus or lung: Secondary | ICD-10-CM

## 2024-03-29 DIAGNOSIS — J432 Centrilobular emphysema: Secondary | ICD-10-CM

## 2024-03-29 DIAGNOSIS — R911 Solitary pulmonary nodule: Secondary | ICD-10-CM

## 2024-03-29 MED ORDER — TRELEGY ELLIPTA 100-62.5-25 MCG/ACT IN AEPB: INHALATION_SPRAY | RESPIRATORY_TRACT | Status: DC

## 2024-03-29 NOTE — Progress Notes (Signed)
 Synopsis: Referred in July 2024 for cough  Subjective:   PATIENT ID: Jackson Powers GENDER: male DOB: Dec 11, 1945, MRN: [id number]  HPI  No chief complaint on file.  Jackson Powers is a 78 year old male, former smoker with history of hypertension and Stage IIIA NSCLC who returns to pulmonary clinic for COPD.  Initial OV 06/15/23 He reports cough with intermittent sputum production over recent months.  He reports that initially started along with a sinus infection but despite multiple rounds of antibiotics he has not had improvement.  He denies significant issues of shortness of breath or wheezing.  He does report postnasal drip.  He denies heartburn issues.  He is a former smoker and quit in 1989.  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  OV 10/05/23 The patient, with a history of mild COPD and emphysema, presents with a persistent cough and phlegm production. The cough is occasionally severe enough to wake him at night, and he reports feeling "half sick" in the mornings, possibly due to swallowing mucus overnight. The cough and phlegm production occur throughout the day, but are not associated with shortness of breath or hoarseness.  The patient has been using an inhaler and a nasal spray as prescribed, but reports no improvement in symptoms with the inhaler. The nasal spray has helped clear up some nasal drainage, but has not improved the cough. The patient also reports experiencing heartburn.  OV 03/29/24 Patient has been doing well since last visit. He tried trelegy ellipta  sample and can't remember if it helped his breathing much.   He had nav bronch 12/02/23 with Dr. Darnelle Elders. Pathology from [address] 7 and 11 showed non-small cell adenocarcinoma lung cancer. He was treated with neoadjuvant carboplatin , alimta and nivolumab  starting in February and completed 3 cycles. Follow up imaging showed a decrease in size of the LUL mass and stable  RUL nodule. He had surgical evaluation and deemed not a surgical candidate post neoadjuvant therapy. He has follow up with Dr Liam Redhead tomorrow.   Past Medical History:  Diagnosis Date   Acute gastritis without hemorrhage 2007   Arthritis    osteoarthritis    Benign neoplasm of colon 2007   COPD (chronic obstructive pulmonary disease) (HCC)    Esophagitis 2007   Hypertension    Orthopnea    reports " i have trouble breathing when i lie completely flat. i have to use 2 people to prop my head up"    Panic anxiety syndrome    patient reports having severe panic anxiety and claustraphobia. states with ear surgery years ago , they had to tie him down which caused him to panic even more;    Right BBB/left ant fasc block    (OLD)   TIA (transient ischemic attack)      History reviewed. No pertinent family history.   Social History   Socioeconomic History   Marital status: Married    Spouse name: Not on file   Number of children: Not on file   Years of education: Not on file   Highest education level: Not on file  Occupational History   Not on file  Tobacco Use   Smoking status: Former    Current packs/day: 0.00    Average packs/day: 2.0 packs/day for 15.0 years (30.0 ttl pk-yrs)    Types: Cigarettes    Start date: 13    Quit date: 1989    Years  since quitting: 36.3   Smokeless tobacco: Never  Vaping Use   Vaping status: Never Used  Substance and Sexual Activity   Alcohol use: Not Currently   Drug use: Never   Sexual activity: Not on file  Other Topics Concern   Not on file  Social History Narrative   Not on file   Social Drivers of Health   Financial Resource Strain: Not on file  Food Insecurity: Not on file  Transportation Needs: Not on file  Physical Activity: Not on file  Stress: Not on file  Social Connections: Not on file  Intimate Partner Violence: Not on file     Allergies  Allergen Reactions   Fosaprepitant  Shortness Of Breath    SOB, back pain and  mild flushing     Outpatient Medications Prior to Visit  Medication Sig Dispense Refill   amLODipine  (NORVASC ) 10 MG tablet Take 10 mg by mouth daily.     clopidogrel  (PLAVIX ) 75 MG tablet Take 75 mg by mouth daily.     fluticasone  (FLONASE ) 50 MCG/ACT nasal spray SPRAY 1 SPRAY INTO BOTH NOSTRILS DAILY. (Patient not taking: Reported on 03/29/2024) 16 mL 2   Fluticasone -Umeclidin-Vilant (TRELEGY ELLIPTA ) 100-62.5-25 MCG/ACT AEPB TAKE 1 PUFF BY MOUTH EVERY DAY (Patient not taking: Reported on 03/29/2024) 60 each 3   folic acid  (FOLVITE ) 1 MG tablet Take 1 tablet (1 mg total) by mouth daily. Start 7 days before pemetrexed  chemotherapy. Continue until 21 days after pemetrexed  completed. (Patient not taking: Reported on 03/29/2024) 100 tablet 3   metoprolol succinate (TOPROL-XL) 25 MG 24 hr tablet Take 25 mg by mouth daily. (Patient not taking: Reported on 03/29/2024)     ondansetron  (ZOFRAN ) 8 MG tablet Take 1 tablet (8 mg total) by mouth every 8 (eight) hours as needed for nausea or vomiting. Start on the third day after carboplatin . (Patient not taking: Reported on 03/29/2024) 30 tablet 1   prochlorperazine  (COMPAZINE ) 10 MG tablet Take 1 tablet (10 mg total) by mouth every 6 (six) hours as needed for nausea or vomiting. (Patient not taking: Reported on 03/29/2024) 30 tablet 1   Facility-Administered Medications Prior to Visit  Medication Dose Route Frequency Provider Last Rate Last Admin   sodium chloride  flush (NS) 0.9 % injection 3 mL  3 mL Intravenous Q12H Cherrie Cornwall, MD       Review of Systems  Constitutional:  Negative for chills, fever, malaise/fatigue and weight loss.  HENT:  Negative for congestion, sinus pain and sore throat.   Eyes: Negative.   Respiratory:  Negative for cough, hemoptysis, sputum production, shortness of breath and wheezing.   Cardiovascular:  Negative for chest pain, palpitations, orthopnea, claudication and leg swelling.  Gastrointestinal:  Negative for abdominal  pain, heartburn, nausea and vomiting.  Genitourinary: Negative.   Musculoskeletal:  Negative for joint pain and myalgias.  Skin:  Negative for rash.  Neurological:  Negative for weakness.  Endo/Heme/Allergies: Negative.   Psychiatric/Behavioral: Negative.     Objective:   Vitals:   03/29/24 1314  BP: 128/78  Pulse: 80  SpO2: 98%  Weight: 248 lb 12.8 oz (112.9 kg)  Height: 6\' 3"  (1.905 m)    Physical Exam Constitutional:      General: He is not in acute distress. HENT:     Head: Normocephalic and atraumatic.  Eyes:     Conjunctiva/sclera: Conjunctivae normal.  Cardiovascular:     Rate and Rhythm: Normal rate and regular rhythm.     Pulses: Normal pulses.  Heart sounds: Normal heart sounds. No murmur heard. Pulmonary:     Effort: Pulmonary effort is normal.     Breath sounds: No wheezing, rhonchi or rales.  Musculoskeletal:     Right lower leg: No edema.     Left lower leg: No edema.  Skin:    General: Skin is warm and dry.  Neurological:     Mental Status: He is alert.    CBC    Component Value Date/Time   WBC 5.9 03/09/2024 0752   WBC 3.6 (L) 02/19/2024 1802   RBC 4.07 (L) 03/09/2024 0752   HGB 12.9 (L) 03/09/2024 0752   HCT 37.3 (L) 03/09/2024 0752   PLT 227 03/09/2024 0752   MCV 91.6 03/09/2024 0752   MCH 31.7 03/09/2024 0752   MCHC 34.6 03/09/2024 0752   RDW 18.1 (H) 03/09/2024 0752   LYMPHSABS 2.8 03/09/2024 0752   MONOABS 0.5 03/09/2024 0752   EOSABS 0.1 03/09/2024 0752   BASOSABS 0.0 03/09/2024 0752      Latest Ref Rng & Units 03/09/2024    7:52 AM 02/19/2024    6:02 PM 02/10/2024   10:06 AM  BMP  Glucose 70 - 99 mg/dL 147  829  562   BUN 8 - 23 mg/dL 15  22  16    Creatinine 0.61 - 1.24 mg/dL 1.30  8.65  7.84   Sodium 135 - 145 mmol/L 141  140  140   Potassium 3.5 - 5.1 mmol/L 3.9  4.0  4.1   Chloride 98 - 111 mmol/L 108  107  107   CO2 22 - 32 mmol/L 28  22  28    Calcium  8.9 - 10.3 mg/dL 9.4  9.0  9.5    Chest imaging: CT Chest  03/09/24 Mediastinum/Nodes: Stable cluster of small right hilar nodes. Stable soft tissue density in the left hilar area without discrete measurable node. No mediastinal adenopathy. Small scattered right paratracheal and AP window nodes are unchanged. The esophagus is unremarkable.   Lungs/Pleura: The left hilar/medial left upper lobe lesion has decreased in size since the prior CT scan. It measures approximately 5.5 x 1.7 cm and previously measured 6.3 x 2.2 cm. Stable 12 mm smoothly marginated well circumscribed subpleural nodule in the right upper lobe image number 72/5 no new pulmonary nodules to suggest metastatic disease. Stable scarring changes in the left upper lobe and left lower lobe which could be related to radiation treatment. No pleural effusions or pleural nodules. The central tracheobronchial tree is unremarkable.  CT Chest 08/09/23 Stable 11 mm juxtapleural right upper lobe lung nodule.   However the airspace opacity which is nodular perihilar on the left is persistent. With appearance of chronic airspace disease would recommend additional workup. PET-CT scan is recommended as the next step in the workup when clinically appropriate   Findings will be called to the ordering service by the radiology physician assistant team   Aortic Atherosclerosis (ICD10-I70.0) and Emphysema (ICD10-J43.9).  CT Chest 04/21/23 1. 1.1 x 1.0 cm pulmonary nodule in the periphery of the right upper lobe. Consider one of the following in 3 months for both low-risk and high-risk individuals: (a) repeat chest CT or (b) follow-up PET-CT. This recommendation follows the consensus statement: Guidelines for Management of Incidental Pulmonary Nodules Detected on CT Images: From the Fleischner Society 2017; Radiology 2017; 284:228-243. 2. Mild diffuse bronchial wall thickening with mild to moderate centrilobular and paraseptal emphysema; imaging findings suggestive of underlying COPD. 3. Aortic  atherosclerosis, in addition to left  main and three-vessel coronary artery disease. Assessment for potential risk factor modification, dietary therapy or pharmacologic therapy may be warranted, if clinically indicated.  PFT:    Latest Ref Rng & Units 10/05/2023   10:29 AM  PFT Results  FVC-Pre L 4.25   FVC-Predicted Pre % 88   FVC-Post L 4.34   FVC-Predicted Post % 90   Pre FEV1/FVC % % 68   Post FEV1/FCV % % 67   FEV1-Pre L 2.90   FEV1-Predicted Pre % 83   FEV1-Post L 2.90   DLCO uncorrected ml/min/mmHg 24.92   DLCO UNC% % 89   DLCO corrected ml/min/mmHg 24.92   DLCO COR %Predicted % 89   DLVA Predicted % 92   TLC L 8.00   TLC % Predicted % 101   RV % Predicted % 121     Labs:  Path:  Echo:  Heart Catheterization:    Assessment & Plan:   Lung nodule  NSCLC of left lung (HCC)  Centrilobular emphysema (HCC)  Discussion: Jackson Powers is a 78 year old male, former smoker with history of hypertension and Stage IIIA NSCLC who returns to pulmonary clinic for COPD.  COPD - try trelegy ellipta  1 puff daily - he is to let us  know if he would like to continue on this for his breathing if he has positive response  Stage IIIA NSCLC - following with Oncology, follow up tomorrow after being deemed not a surgical candidate - has completed 3 cycles of neoadjuvant chemotherapy  RUL Lung Nodule - measuring 1.1 x 1.0cm, stable over the past 11 months  - will continue to monitor lung nodule at this time  Follow up in 6 months  Duaine German, MD [facility] Office: [phone]   Current Outpatient Medications:    amLODipine  (NORVASC ) 10 MG tablet, Take 10 mg by mouth daily., Disp: , Rfl:    clopidogrel  (PLAVIX ) 75 MG tablet, Take 75 mg by mouth daily., Disp: , Rfl:    fluticasone  (FLONASE ) 50 MCG/ACT nasal spray, SPRAY 1 SPRAY INTO BOTH NOSTRILS DAILY. (Patient not taking: Reported on 03/29/2024), Disp: 16 mL, Rfl: 2    Fluticasone -Umeclidin-Vilant (TRELEGY ELLIPTA ) 100-62.5-25 MCG/ACT AEPB, TAKE 1 PUFF BY MOUTH EVERY DAY (Patient not taking: Reported on 03/29/2024), Disp: 60 each, Rfl: 3   folic acid  (FOLVITE ) 1 MG tablet, Take 1 tablet (1 mg total) by mouth daily. Start 7 days before pemetrexed  chemotherapy. Continue until 21 days after pemetrexed  completed. (Patient not taking: Reported on 03/29/2024), Disp: 100 tablet, Rfl: 3   metoprolol succinate (TOPROL-XL) 25 MG 24 hr tablet, Take 25 mg by mouth daily. (Patient not taking: Reported on 03/29/2024), Disp: , Rfl:    ondansetron  (ZOFRAN ) 8 MG tablet, Take 1 tablet (8 mg total) by mouth every 8 (eight) hours as needed for nausea or vomiting. Start on the third day after carboplatin . (Patient not taking: Reported on 03/29/2024), Disp: 30 tablet, Rfl: 1   prochlorperazine  (COMPAZINE ) 10 MG tablet, Take 1 tablet (10 mg total) by mouth every 6 (six) hours as needed for nausea or vomiting. (Patient not taking: Reported on 03/29/2024), Disp: 30 tablet, Rfl: 1 No current facility-administered medications for this visit.  Facility-Administered Medications Ordered in Other Visits:    sodium chloride  flush (NS) 0.9 % injection 3 mL, 3 mL, Intravenous, Q12H, Cherrie Cornwall, MD

## 2024-03-29 NOTE — Patient Instructions (Addendum)
 Try trelegy ellipta  100, 1 puff daily - rinse mouth out after each use  Let us  know if you would like a prescription  Follow up in 6 months, call sooner if needed

## 2024-03-29 NOTE — Addendum Note (Signed)
 Addended by: Luana Rumple R on: 03/29/2024 04:16 PM   Modules accepted: Orders

## 2024-03-30 ENCOUNTER — Inpatient Hospital Stay

## 2024-03-30 ENCOUNTER — Inpatient Hospital Stay (HOSPITAL_BASED_OUTPATIENT_CLINIC_OR_DEPARTMENT_OTHER): Admitting: Internal Medicine

## 2024-03-30 VITALS — BP 144/60 | HR 65 | Temp 97.1°F | Resp 17 | Ht 75.0 in | Wt 247.0 lb

## 2024-03-30 DIAGNOSIS — C3412 Malignant neoplasm of upper lobe, left bronchus or lung: Secondary | ICD-10-CM

## 2024-03-30 LAB — CBC WITH DIFFERENTIAL (CANCER CENTER ONLY)
Abs Immature Granulocytes: 0.01 10*3/uL (ref 0.00–0.07)
Basophils Absolute: 0 10*3/uL (ref 0.0–0.1)
Basophils Relative: 0 %
Eosinophils Absolute: 0.1 10*3/uL (ref 0.0–0.5)
Eosinophils Relative: 2 %
HCT: 38.1 % — ABNORMAL LOW (ref 39.0–52.0)
Hemoglobin: 13.1 g/dL (ref 13.0–17.0)
Immature Granulocytes: 0 %
Lymphocytes Relative: 41 %
Lymphs Abs: 2.8 10*3/uL (ref 0.7–4.0)
MCH: 32 pg (ref 26.0–34.0)
MCHC: 34.4 g/dL (ref 30.0–36.0)
MCV: 92.9 fL (ref 80.0–100.0)
Monocytes Absolute: 0.6 10*3/uL (ref 0.1–1.0)
Monocytes Relative: 9 %
Neutro Abs: 3.2 10*3/uL (ref 1.7–7.7)
Neutrophils Relative %: 48 %
Platelet Count: 200 10*3/uL (ref 150–400)
RBC: 4.1 MIL/uL — ABNORMAL LOW (ref 4.22–5.81)
RDW: 16.4 % — ABNORMAL HIGH (ref 11.5–15.5)
WBC Count: 6.7 10*3/uL (ref 4.0–10.5)
nRBC: 0 % (ref 0.0–0.2)

## 2024-03-30 LAB — CMP (CANCER CENTER ONLY)
ALT: 23 U/L (ref 0–44)
AST: 25 U/L (ref 15–41)
Albumin: 4.4 g/dL (ref 3.5–5.0)
Alkaline Phosphatase: 50 U/L (ref 38–126)
Anion gap: 5 (ref 5–15)
BUN: 13 mg/dL (ref 8–23)
CO2: 29 mmol/L (ref 22–32)
Calcium: 9.5 mg/dL (ref 8.9–10.3)
Chloride: 106 mmol/L (ref 98–111)
Creatinine: 0.9 mg/dL (ref 0.61–1.24)
GFR, Estimated: >60 mL/min (ref >60)
Glucose, Bld: 109 mg/dL — ABNORMAL HIGH (ref 70–99)
Potassium: 4 mmol/L (ref 3.5–5.1)
Sodium: 140 mmol/L (ref 135–145)
Total Bilirubin: 0.6 mg/dL (ref 0.0–1.2)
Total Protein: 7.4 g/dL (ref 6.5–8.1)

## 2024-03-30 NOTE — Progress Notes (Signed)
 Location of tumor and Histology per Pathology Report: Left lung  Biopsy:    Past/Anticipated interventions by surgeon, if any: left    Past/Anticipated interventions by medical oncology, if any:      Pain issues, if any:  No   SAFETY ISSUES: Prior radiation? No Pacemaker/ICD? No Possible current pregnancy? No Is the patient on methotrexate? No  Current Complaints / other details:       BP 112/89 (BP Location: Left Arm, Patient Position: Sitting)   Pulse 84   Temp (!) 97.3 F (36.3 C) (Temporal)   Resp 18   Ht 6\' 3"  (1.905 m)   Wt 246 lb 4 oz (111.7 kg)   SpO2 99%   BMI 30.78 kg/m

## 2024-03-30 NOTE — Progress Notes (Signed)
 DISCONTINUE ON PATHWAY REGIMEN - Non-Small Cell Lung     A cycle is every 21 days:     Pemetrexed       Carboplatin    **Always confirm dose/schedule in your pharmacy ordering system**  PRIOR TREATMENT: LOS360: Carboplatin  AUC=5 + Pemetrexed  500 mg/m2 q21 Days x 3 Cycles  START ON PATHWAY REGIMEN - Non-Small Cell Lung     A cycle is every 7 days, concurrent with RT:     Paclitaxel      Carboplatin    **Always confirm dose/schedule in your pharmacy ordering system**  Patient Characteristics: Preoperative or Nonsurgical Candidate (Clinical Staging), Stage IIB (N2a only) or Stage III - Nonsurgical Candidate, PS = 0,1 Therapeutic Status: Preoperative or Nonsurgical Candidate (Clinical Staging) AJCC T Category: cT3 AJCC N Category: cN2 AJCC M Category: cM0 AJCC 9 Stage Grouping: Unknown Check here if patient was staged using an edition other than AJCC Staging 9th Edition: false ECOG Performance Status: 1 Intent of Therapy: Curative Intent, Discussed with Patient

## 2024-03-30 NOTE — Progress Notes (Signed)
 [facility] [phone]   [phone]  OFFICE PROGRESS NOTE  [name], PA-C [address] [place] [address]  DIAGNOSIS: Stage IIIA (T3, N2, M0) non-small cell lung cancer, favoring adenocarcinoma presented with left hilar soft tissue mass in addition to left hilar and mediastinal lymphadenopathy diagnosed in January 2025.   PRIOR THERAPY: Neoadjuvant systemic chemotherapy with carboplatin  for AUC of 5, Alimta 500 Mg/M2 and nivolumab  360 Mg IV every 3 weeks.  First dose December 30, 2023.  Status post 3 cycles.  CURRENT THERAPY:   INTERVAL HISTORY: Edwar Powers 78 y.o. male returns to the clinic today for follow-up visit accompanied by his wife and daughter. Discussed the use of AI scribe software for clinical note transcription with the patient, who gave verbal consent to proceed.  History of Present Illness   Jackson Powers is a 78 year old male with lung cancer who presents for evaluation and discussion of treatment options. He is accompanied by his wife and daughter. He was referred by Dr. Luna Salinas from thoracic surgery for evaluation of non-surgical treatment options.  He has been diagnosed with lung cancer, initially considered for surgical resection, but was deemed not a good surgical candidate due to the tumor's proximity and invasion into the heart area.  He has no new complaints since his last visit. No chest pain, breathing issues, or significant coughing, although he occasionally coughs up phlegm, which he describes as similar to having a cold. No blood in the sputum, nausea, vomiting, diarrhea, or recent weight loss.  His previous treatment included chemotherapy and immunotherapy, which aimed to make the tumor resectable. The previous chemotherapy made him feel tired and sick. He is concerned about the potential side effects of radiation, particularly regarding inflammation of the esophagus and lungs, and the proximity of the  tumor to the heart.       MEDICAL HISTORY: Past Medical History:  Diagnosis Date   Acute gastritis without hemorrhage 2007   Arthritis    osteoarthritis    Benign neoplasm of colon 2007   COPD (chronic obstructive pulmonary disease) (HCC)    Esophagitis 2007   Hypertension    Orthopnea    reports " i have trouble breathing when i lie completely flat. i have to use 2 people to prop my head up"    Panic anxiety syndrome    patient reports having severe panic anxiety and claustraphobia. states with ear surgery years ago , they had to tie him down which caused him to panic even more;    Right BBB/left ant fasc block    (OLD)   TIA (transient ischemic attack)     ALLERGIES:  is allergic to fosaprepitant .  MEDICATIONS:  Current Outpatient Medications  Medication Sig Dispense Refill   amLODipine  (NORVASC ) 10 MG tablet Take 10 mg by mouth daily.     fluticasone  (FLONASE ) 50 MCG/ACT nasal spray SPRAY 1 SPRAY INTO BOTH NOSTRILS DAILY. (Patient not taking: Reported on 03/29/2024) 16 mL 2   Fluticasone -Umeclidin-Vilant (TRELEGY ELLIPTA ) 100-62.5-25 MCG/ACT AEPB TAKE 1 PUFF BY MOUTH EVERY DAY (Patient not taking: Reported on 03/29/2024) 60 each 3   Fluticasone -Umeclidin-Vilant (TRELEGY ELLIPTA ) 100-62.5-25 MCG/ACT AEPB 2 samples     folic acid  (FOLVITE ) 1 MG tablet Take 1 tablet (1 mg total) by mouth daily. Start 7 days before pemetrexed  chemotherapy. Continue until 21 days after pemetrexed  completed. (Patient not taking: Reported on 03/29/2024) 100 tablet 3   metoprolol succinate (TOPROL-XL) 25 MG 24  hr tablet Take 25 mg by mouth daily. (Patient not taking: Reported on 03/29/2024)     ondansetron  (ZOFRAN ) 8 MG tablet Take 1 tablet (8 mg total) by mouth every 8 (eight) hours as needed for nausea or vomiting. Start on the third day after carboplatin . (Patient not taking: Reported on 03/29/2024) 30 tablet 1   prochlorperazine  (COMPAZINE ) 10 MG tablet Take 1 tablet (10 mg total) by mouth every 6 (six)  hours as needed for nausea or vomiting. (Patient not taking: Reported on 03/29/2024) 30 tablet 1   No current facility-administered medications for this visit.   Facility-Administered Medications Ordered in Other Visits  Medication Dose Route Frequency Provider Last Rate Last Admin   sodium chloride  flush (NS) 0.9 % injection 3 mL  3 mL Intravenous Q12H Cherrie Cornwall, MD        SURGICAL HISTORY:  Past Surgical History:  Procedure Laterality Date   BRONCHIAL BIOPSY  12/02/2023   Procedure: BRONCHIAL BIOPSIES;  Surgeon: Vergia Glasgow, MD;  Location: [facility];  Service: Pulmonary;;   BRONCHIAL BRUSHINGS  12/02/2023   Procedure: BRONCHIAL BRUSHINGS;  Surgeon: Vergia Glasgow, MD;  Location: [facility];  Service: Pulmonary;;   BRONCHIAL NEEDLE ASPIRATION BIOPSY  12/02/2023   Procedure: BRONCHIAL NEEDLE ASPIRATION BIOPSIES;  Surgeon: Vergia Glasgow, MD;  Location: [facility];  Service: Pulmonary;;   BRONCHIAL WASHINGS  12/02/2023   Procedure: BRONCHIAL WASHINGS;  Surgeon: Vergia Glasgow, MD;  Location: [facility];  Service: Pulmonary;;   LEFT HEART CATH AND CORONARY ANGIOGRAPHY Left 10/01/2020   Procedure: LEFT HEART CATH AND CORONARY ANGIOGRAPHY;  Surgeon: Cherrie Cornwall, MD;  Location: [facility];  Service: Cardiovascular;  Laterality: Left;   SKIN CANCER EXCISION Right 2017   EAR; FAILED GRAFT FOLLWOING SURGERY    TOTAL HIP ARTHROPLASTY Right 07/20/2018   Procedure: RIGHT TOTAL HIP ARTHROPLASTY ANTERIOR APPROACH;  Surgeon: Liliane Rei, MD;  Location: [facility];  Service: Orthopedics;  Laterality: Right;   VIDEO BRONCHOSCOPY WITH ENDOBRONCHIAL ULTRASOUND  12/02/2023   Procedure: VIDEO BRONCHOSCOPY WITH ENDOBRONCHIAL ULTRASOUND;  Surgeon: Vergia Glasgow, MD;  Location: [facility];  Service: Pulmonary;;    REVIEW OF SYSTEMS:  Constitutional: positive for fatigue Eyes: negative Ears, nose, mouth, throat, and face: negative Respiratory: positive for cough Cardiovascular:  negative Gastrointestinal: negative Genitourinary:negative Integument/breast: negative Hematologic/lymphatic: negative Musculoskeletal:negative Neurological: negative Behavioral/Psych: negative Endocrine: negative Allergic/Immunologic: negative   PHYSICAL EXAMINATION: General appearance: alert, cooperative, fatigued, and no distress Head: Normocephalic, without obvious abnormality, atraumatic Neck: no adenopathy, no JVD, supple, symmetrical, trachea midline, and thyroid  not enlarged, symmetric, no tenderness/mass/nodules Lymph nodes: Cervical, supraclavicular, and axillary nodes normal. Resp: clear to auscultation bilaterally Back: symmetric, no curvature. ROM normal. No CVA tenderness. Cardio: regular rate and rhythm, S1, S2 normal, no murmur, click, rub or gallop GI: soft, non-tender; bowel sounds normal; no masses,  no organomegaly Extremities: extremities normal, atraumatic, no cyanosis or edema Neurologic: Alert and oriented X 3, normal strength and tone. Normal symmetric reflexes. Normal coordination and gait  ECOG PERFORMANCE STATUS: 1 - Symptomatic but completely ambulatory  Blood pressure (!) 144/60, pulse 65, temperature (!) 97.1 F (36.2 C), temperature source Temporal, resp. rate 17, height 6\' 3"  (1.905 m), weight 247 lb (112 kg), SpO2 98%.  LABORATORY DATA: Lab Results  Component Value Date   WBC 6.7 03/30/2024   HGB 13.1 03/30/2024   HCT 38.1 (L) 03/30/2024   MCV 92.9 03/30/2024   PLT 200 03/30/2024      Chemistry  Component Value Date/Time   NA 141 03/09/2024 0752   K 3.9 03/09/2024 0752   CL 108 03/09/2024 0752   CO2 28 03/09/2024 0752   BUN 15 03/09/2024 0752   CREATININE 0.90 03/09/2024 0752      Component Value Date/Time   CALCIUM  9.4 03/09/2024 0752   ALKPHOS 49 03/09/2024 0752   AST 28 03/09/2024 0752   ALT 30 03/09/2024 0752   BILITOT 0.5 03/09/2024 0752       RADIOGRAPHIC STUDIES: CT Chest W Contrast Result Date:  03/15/2024 CLINICAL DATA:  Restaging non-small cell lung cancer. * Tracking Code: BO * EXAM: CT CHEST WITH CONTRAST TECHNIQUE: Multidetector CT imaging of the chest was performed during intravenous contrast administration. RADIATION DOSE REDUCTION: This exam was performed according to the departmental dose-optimization program which includes automated exposure control, adjustment of the mA and/or kV according to patient size and/or use of iterative reconstruction technique. CONTRAST:  75mL OMNIPAQUE  IOHEXOL  300 MG/ML  SOLN COMPARISON:  PET-CT 10/19/2024 and chest CT 11/30/2023 FINDINGS: Cardiovascular: The heart is normal in size. No pericardial effusion. Stable aortic and three-vessel coronary artery calcifications. The pulmonary arteries are normal. Mediastinum/Nodes: Stable cluster of small right hilar nodes. Stable soft tissue density in the left hilar area without discrete measurable node. No mediastinal adenopathy. Small scattered right paratracheal and AP window nodes are unchanged. The esophagus is unremarkable. Lungs/Pleura: The left hilar/medial left upper lobe lesion has decreased in size since the prior CT scan. It measures approximately 5.5 x 1.7 cm and previously measured 6.3 x 2.2 cm. Stable 12 mm smoothly marginated well circumscribed subpleural nodule in the right upper lobe image number 72/5 no new pulmonary nodules to suggest metastatic disease. Stable scarring changes in the left upper lobe and left lower lobe which could be related to radiation treatment. No pleural effusions or pleural nodules. The central tracheobronchial tree is unremarkable. Upper Abdomen: No significant upper abdominal findings. No hepatic adrenal gland lesions. No upper abdominal adenopathy. Stable vascular calcifications. Musculoskeletal: No chest wall mass, supraclavicular or axillary adenopathy. No thyroid  gland lesions. The bony thorax is intact. No bone lesions or fractures. IMPRESSION: 1. Interval decrease in size of  the left hilar/medial left upper lobe lesion. 2. Stable 12 mm smoothly marginated well circumscribed subpleural nodule in the right upper lobe. 3. No new pulmonary nodules to suggest metastatic disease. 4. Stable scarring changes in the left upper lobe and left lower lobe which could be related to radiation treatment. 5. Stable small mediastinal and bilateral hilar nodes. 6. No findings for upper abdominal metastatic disease. 7. Aortic and coronary artery atherosclerosis. Aortic Atherosclerosis (ICD10-I70.0). Electronically Signed   By: Marrian Siva M.D.   On: 03/15/2024 20:00     ASSESSMENT AND PLAN: This is a very pleasant 78 years old white male with Stage IIIA (T3, N2, M0) non-small cell lung cancer, favoring adenocarcinoma presented with left hilar soft tissue mass in addition to left hilar and mediastinal lymphadenopathy diagnosed in January 2025.  He underwent neoadjuvant systemic chemotherapy with carboplatin  for AUC of 5, Alimta 500 Mg/M2 and nivolumab  360 Mg IV every 3 weeks status post 3 cycles.  He has been tolerating this treatment fairly well. The patient was not a good surgical candidate for resection.  He is here for evaluation and discussion of his treatment options.    Lung cancer, stage III Stage III lung cancer with partial response to previous chemotherapy and immunotherapy. Surgical resection is not feasible due to tumor invasion into the cardiac  region. Current treatment involves chemoradiation, aiming to sensitize the tumor to radiation, with a 70% expected tumor shrinkage rate. Potential for cure exists with a 40% five-year survival rate. Risks of radiation include esophagitis and pneumonitis. He is informed about side effects and the importance of treatment to prevent progression to stage IV, where focus shifts to palliation. - Initiate chemoradiation therapy starting the Monday after Memorial Day, with chemotherapy on Mondays and radiation Monday through Friday for six and a  half weeks. - Refer to radiation oncologist for consultation and detailed discussion of radiation therapy and its side effects. - Schedule a follow-up scan three weeks post-treatment to assess response. - Discuss potential for additional immunotherapy post-treatment if needed.  Chemotherapy treatment plan Chemotherapy with carboplatin  and paclitaxel will be administered weekly on Mondays to enhance radiation sensitivity. The regimen is at reduced doses to minimize side effects, with a lower risk of severe adverse effects such as alopecia. He is informed that treatment can be discontinued if side effects become intolerable. - Administer carboplatin  and paclitaxel chemotherapy on Mondays for six and a half weeks. - Ensure transportation for chemotherapy sessions due to potential side effects such as nausea and drowsiness from pre-medications like diphenhydramine .  Radiation therapy treatment plan Radiation therapy is scheduled daily, Monday to Friday, for six and a half weeks, targeting the tumor and involved lymph nodes to shrink the tumor and prevent progression. The radiation oncologist will provide detailed information about treatment and potential side effects, including esophagitis and pneumonitis. - Administer radiation therapy daily, Monday through Friday, for six and a half weeks. - Coordinate with radiation oncologist for detailed treatment planning and management of side effects.  Goals of Care He and his family are considering the balance between quality and quantity of life. He is not stage IV, and there is potential for cure with stage III treatment. He understands that without treatment, cancer will progress, shifting focus to palliative care. He is encouraged to pursue treatment due to the potential for cure, despite treatment challenges.  Follow-up Follow-up includes coordination with the radiation oncologist and scheduling of a post-treatment scan to evaluate response. - Schedule  follow-up appointment with radiation oncologist within a week for consultation. - Plan for a follow-up scan three weeks after completion of chemoradiation therapy to assess treatment response.   He was advised to call immediately if he has any other concerning symptoms in the interval. The patient voices understanding of current disease status and treatment options and is in agreement with the current care plan.  All questions were answered. The patient knows to call the clinic with any problems, questions or concerns. We can certainly see the patient much sooner if necessary.  The total time spent in the appointment was 55 minutes.  Disclaimer: This note was dictated with voice recognition software. Similar sounding words can inadvertently be transcribed and may not be corrected upon review.

## 2024-03-31 ENCOUNTER — Telehealth: Payer: Self-pay | Admitting: Internal Medicine

## 2024-03-31 NOTE — Telephone Encounter (Signed)
 Informed the patient of appointments scheduled. The patient is aware of the appointment details and is active on MyChart.

## 2024-03-31 NOTE — Telephone Encounter (Signed)
 Pt saw Dr Diania Fortes ( pulmonary)on 05/14.

## 2024-04-01 ENCOUNTER — Other Ambulatory Visit: Payer: Self-pay

## 2024-04-02 NOTE — Progress Notes (Signed)
 Radiation Oncology         [phone] ________________________________  Initial Outpatient Consultation  Name: Jackson Powers MRN: [id number]  Date: 04/03/2024  DOB: 06/21/1946  WU:[id number], Cozette Divine, MD   REFERRING PHYSICIAN: Marlene Simas, MD  DIAGNOSIS: The encounter diagnosis was Malignant neoplasm of upper lobe of left lung (HCC).  Stage IIIA (T3, N2, M0) non-small cell lung cancer, favoring adenocarcinoma presented with left hilar soft tissue mass in addition to left hilar and mediastinal lymphadenopathy diagnosed in January 2025 - s/p 3 cycles of neoadjuvant chemo-immunotherapy   HISTORY OF PRESENT ILLNESS::Jackson Powers is a 78 y.o. male who is accompanied by his supportive daughter. he is seen as a courtesy of Dr. Marguerita Shih for an opinion concerning radiation therapy as part of management for his recently diagnosed left lung cancer.   The patient initially presented to his PCP this past June (2024) with c/o cough and congestion for over 1 month. Given his smoking  history, a chest CT with contrast was performed on 04/21/23 which demonstrated a 1.1 cm RUL pulmonary nodule along with evidence of COPD.   He was given abx by his PCP for a suspected sinus infection without improvement in his symptoms, and was subsequently referred to Dr. Diania Fortes in July 2024 for further management. COPD was the suspected to be the cause of his ongoing symptoms during his initial consultation visit with Dr. Diania Fortes on 06/15/23, and he was started on Advair discus 250-50 mcg 1 puff twice daily management.   A follow-up chest CT was then performed on 08/09/23 which demonstrated stability of the 1.1 cm RUL nodule as well as persistence of an airspace opacity in the perihilar left lung. (The perihilar left lung finding may have been appreciated on his prior CT per imaging report).   A PET scan was later performed on 10/20/23 which demonstrated hypermetabolism associated with the chronic left  infrahilar nodular airspace opacity, which appeared to have primarily upper lobe but also lower lobe components along the medial pleural margin. Evidence of metastatic lymphadenopathy was also demonstrated, characterized by a mildly enlarged AP window lymph node with a maximum SUV of 4.9, a small left hilar lymph node maximum with a max SUV of 5.6, and a small right hilar lymph node with a maximum SUV of 3.9.The RUL nodule otherwise appeared to have low metabolic activity, and there was no evidence of extrathoracic metastatic disease.   A super-D chest CT was then performed on 11/30/23 which demonstrated interval enlargement of the infiltrative mass-like density along the anterior aspect of the left hilum, concerning for progressive disease. CT otherwise showed no evidence of metastatic disease, and continued stability of the RUL nodule.   Based on Dr. Reine Caraway recommendation, he opted to proceed with a bronchoscopy on 12/02/23 under Dr. Darnelle Elders. FNA and brushings of the LUL-lingula collected at that time both showed non-small cell carcinoma, possibly in keeping with adenocarcinoma. FNA of lymph nodes 7 and 11L also showed non-small cell carcinoma. (FNA of lymph nodes 11R and 4L were also collected and came back negative for malignancy).   He was accordingly referred to Dr. Marguerita Shih on 12/14/23 of for further management. Treatment options discussed at that time included concurrent chemoradiation with weekly carboplatin  and paclitaxel for 6-7 weeks followed by consolidation treatment with immunotherapy, vs consideration of neoadjuvant chemo-immunotherapy followed by surgical resection. Guardant 360 analysis was also ordered and came back negative for any actionable mutations   His case was presented at the tumor board and  he was deemed to be a good candidate for neoadjuvant chemoimmunotherapy prior to resection (with the goal of shrinking the tumor prior to surgery. (PFT's obtained in November showed adequate  lung function for surgical intervention). An MRI of the brain was also performed on 12/20/23 to complete his staging work-up which showed no evidence of intracranial metastatic disease.   Based on Dr. Bing Buff recommendations, he opted to proceed with his first cycle of neoadjuvant chemoimmunotherapy consisting of carboplatin , alimta, and nivolumab  on 12/29/23 (q3 weeks x 3 cycles). He tolerated systemic therapy quite well overall other than nausea (without emesis) and fatigue.   After completing his 3rd cycle of neoadjuvant therapy on 02/10/24, he presented for a restaging chest CT w/ contrast on 03/09/24 which demonstrated: an interval decrease in size of the left hilar/medial left upper lobe lesion, stability of 12 mm subpleural nodule in the right upper lobe, and stability of the mediastinal and bilateral hilar lymph nodes. No new or progressive findings were demonstrated overall.   He was then referred to cardiothoracic surgery and was seen in consultation by Dr. Luna Salinas on 03/23/24 for consideration of resection. However, based on the location of the nodule and subsequent concern for mediastinal invasion, surgical intervention was not recommended and Dr. Luna Salinas advised that he continue with chemo-immunotherapy with radiation therapy.   He accordingly returned to Dr. Marguerita Shih on 05/15 to discuss adjuvant treatment options. At this time, Dr. Marguerita Shih would like him to start chemoradiation the Monday after Memorial Day; with chemotherapy to be delivered on Mondays and radiation therapy Monday through Friday for 6.5 weeks. Chemotherapy will consist of carboplatin  and paclitaxel at a reduced dose to minimize side effects   Patient notes a worsening cough that he attributes to his allergies and mild dyspnea on exertion. He also has intermittent dysphagia with solid foods, stating that food occasionally gets stuck in his throat which results in him having to cough it up. He has had his esophagus  disalted x2, with his last dilation in 2022. He has had a EGD in 2023 which demonstrated stenosis of the esophagus, but no strictures.  His daughter who works in gastroenterology as a Engineer, civil (consulting) reports that his previous areas of stenosis have been near the GE junction.   PREVIOUS RADIATION THERAPY: No  PAST MEDICAL HISTORY:  Past Medical History:  Diagnosis Date   Acute gastritis without hemorrhage 2007   Arthritis    osteoarthritis    Benign neoplasm of colon 2007   COPD (chronic obstructive pulmonary disease) (HCC)    Esophagitis 2007   Hypertension    Orthopnea    reports " i have trouble breathing when i lie completely flat. i have to use 2 people to prop my head up"    Panic anxiety syndrome    patient reports having severe panic anxiety and claustraphobia. states with ear surgery years ago , they had to tie him down which caused him to panic even more;    Right BBB/left ant fasc block    (OLD)   TIA (transient ischemic attack)     PAST SURGICAL HISTORY: Past Surgical History:  Procedure Laterality Date   BRONCHIAL BIOPSY  12/02/2023   Procedure: BRONCHIAL BIOPSIES;  Surgeon: Vergia Glasgow, MD;  Location: [facility] ENDOSCOPY;  Service: Pulmonary;;   BRONCHIAL BRUSHINGS  12/02/2023   Procedure: BRONCHIAL BRUSHINGS;  Surgeon: Vergia Glasgow, MD;  Location: [facility] ENDOSCOPY;  Service: Pulmonary;;   BRONCHIAL NEEDLE ASPIRATION BIOPSY  12/02/2023   Procedure: BRONCHIAL NEEDLE ASPIRATION BIOPSIES;  Surgeon:  Vergia Glasgow, MD;  Location: [facility] ENDOSCOPY;  Service: Pulmonary;;   BRONCHIAL WASHINGS  12/02/2023   Procedure: BRONCHIAL WASHINGS;  Surgeon: Vergia Glasgow, MD;  Location: [facility] ENDOSCOPY;  Service: Pulmonary;;   LEFT HEART CATH AND CORONARY ANGIOGRAPHY Left 10/01/2020   Procedure: LEFT HEART CATH AND CORONARY ANGIOGRAPHY;  Surgeon: Cherrie Cornwall, MD;  Location: [facility] INVASIVE CV LAB;  Service: Cardiovascular;  Laterality: Left;   SKIN CANCER EXCISION Right 2017   EAR; FAILED GRAFT FOLLWOING  SURGERY    TOTAL HIP ARTHROPLASTY Right 07/20/2018   Procedure: RIGHT TOTAL HIP ARTHROPLASTY ANTERIOR APPROACH;  Surgeon: Liliane Rei, MD;  Location: [facility] ORS;  Service: Orthopedics;  Laterality: Right;   VIDEO BRONCHOSCOPY WITH ENDOBRONCHIAL ULTRASOUND  12/02/2023   Procedure: VIDEO BRONCHOSCOPY WITH ENDOBRONCHIAL ULTRASOUND;  Surgeon: Vergia Glasgow, MD;  Location: [facility] ENDOSCOPY;  Service: Pulmonary;;    FAMILY HISTORY: History reviewed. No pertinent family history.  SOCIAL HISTORY:  Social History   Tobacco Use   Smoking status: Former    Current packs/day: 0.00    Average packs/day: 2.0 packs/day for 15.0 years (30.0 ttl pk-yrs)    Types: Cigarettes    Start date: 96    Quit date: 1989    Years since quitting: 36.4   Smokeless tobacco: Never  Vaping Use   Vaping status: Never Used  Substance Use Topics   Alcohol use: Not Currently   Drug use: Never    ALLERGIES:  Allergies  Allergen Reactions   Fosaprepitant  Shortness Of Breath    SOB, back pain and mild flushing    MEDICATIONS:  Current Outpatient Medications  Medication Sig Dispense Refill   amLODipine  (NORVASC ) 10 MG tablet Take 10 mg by mouth daily.     fluticasone  (FLONASE ) 50 MCG/ACT nasal spray SPRAY 1 SPRAY INTO BOTH NOSTRILS DAILY. 16 mL 2   Fluticasone -Umeclidin-Vilant (TRELEGY ELLIPTA ) 100-62.5-25 MCG/ACT AEPB TAKE 1 PUFF BY MOUTH EVERY DAY 60 each 3   Fluticasone -Umeclidin-Vilant (TRELEGY ELLIPTA ) 100-62.5-25 MCG/ACT AEPB 2 samples     metoprolol succinate (TOPROL-XL) 25 MG 24 hr tablet Take 25 mg by mouth daily.     No current facility-administered medications for this encounter.   Facility-Administered Medications Ordered in Other Encounters  Medication Dose Route Frequency Provider Last Rate Last Admin   sodium chloride  flush (NS) 0.9 % injection 3 mL  3 mL Intravenous Q12H Cherrie Cornwall, MD        REVIEW OF SYSTEMS: Notable for that above.   PHYSICAL EXAM:  height is 6\' 3"  (1.905 m) and weight  is 246 lb 4 oz (111.7 kg). His temporal temperature is 97.3 F (36.3 C) (abnormal). His blood pressure is 112/89 and his pulse is 84. His respiration is 18 and oxygen saturation is 99%.   General: Alert and oriented, in no acute distress HEENT: Head is normocephalic. Extraocular movements are intact.  Neck: Neck is supple, no palpable cervical or supraclavicular lymphadenopathy. Heart: Regular in rate and rhythm with no murmurs, rubs, or gallops. Chest: Clear to auscultation bilaterally, with no rhonchi, wheezes, or rales. Abdomen: Soft, nontender, nondistended, with no rigidity or guarding. Extremities: No cyanosis or edema. Lymphatics: see Neck Exam Skin: No concerning lesions. Musculoskeletal: symmetric strength and muscle tone throughout. Neurologic: Cranial nerves II through XII are grossly intact. No obvious focalities. Speech is fluent. Coordination is intact. Psychiatric: Judgment and insight are intact. Affect is appropriate.   ECOG = 0  0 - Asymptomatic (Fully active, able to carry on all predisease activities without restriction)  1 - Symptomatic but completely ambulatory (Restricted in physically strenuous activity but ambulatory and able to carry out work of a light or sedentary nature. For example, light housework, office work)  2 - Symptomatic, <50% in bed during the day (Ambulatory and capable of all self care but unable to carry out any work activities. Up and about more than 50% of waking hours)  3 - Symptomatic, >50% in bed, but not bedbound (Capable of only limited self-care, confined to bed or chair 50% or more of waking hours)  4 - Bedbound (Completely disabled. Cannot carry on any self-care. Totally confined to bed or chair)  5 - Death   Aurea Blossom MM, Creech RH, Tormey DC, et al. [phone]). "Toxicity and response criteria of the [facility]". Am. Hillard Lowes. Oncol. 5 (6): 649-55  LABORATORY DATA:  Lab Results  Component Value Date   WBC 6.7  03/30/2024   HGB 13.1 03/30/2024   HCT 38.1 (L) 03/30/2024   MCV 92.9 03/30/2024   PLT 200 03/30/2024   NEUTROABS 3.2 03/30/2024   Lab Results  Component Value Date   NA 140 03/30/2024   K 4.0 03/30/2024   CL 106 03/30/2024   CO2 29 03/30/2024   GLUCOSE 109 (H) 03/30/2024   BUN 13 03/30/2024   CREATININE 0.90 03/30/2024   CALCIUM  9.5 03/30/2024      RADIOGRAPHY: CT Chest W Contrast Result Date: 03/15/2024 CLINICAL DATA:  Restaging non-small cell lung cancer. * Tracking Code: BO * EXAM: CT CHEST WITH CONTRAST TECHNIQUE: Multidetector CT imaging of the chest was performed during intravenous contrast administration. RADIATION DOSE REDUCTION: This exam was performed according to the departmental dose-optimization program which includes automated exposure control, adjustment of the mA and/or kV according to patient size and/or use of iterative reconstruction technique. CONTRAST:  75mL OMNIPAQUE  IOHEXOL  300 MG/ML  SOLN COMPARISON:  PET-CT 10/19/2024 and chest CT 11/30/2023 FINDINGS: Cardiovascular: The heart is normal in size. No pericardial effusion. Stable aortic and three-vessel coronary artery calcifications. The pulmonary arteries are normal. Mediastinum/Nodes: Stable cluster of small right hilar nodes. Stable soft tissue density in the left hilar area without discrete measurable node. No mediastinal adenopathy. Small scattered right paratracheal and AP window nodes are unchanged. The esophagus is unremarkable. Lungs/Pleura: The left hilar/medial left upper lobe lesion has decreased in size since the prior CT scan. It measures approximately 5.5 x 1.7 cm and previously measured 6.3 x 2.2 cm. Stable 12 mm smoothly marginated well circumscribed subpleural nodule in the right upper lobe image number 72/5 no new pulmonary nodules to suggest metastatic disease. Stable scarring changes in the left upper lobe and left lower lobe which could be related to radiation treatment. No pleural effusions or  pleural nodules. The central tracheobronchial tree is unremarkable. Upper Abdomen: No significant upper abdominal findings. No hepatic adrenal gland lesions. No upper abdominal adenopathy. Stable vascular calcifications. Musculoskeletal: No chest wall mass, supraclavicular or axillary adenopathy. No thyroid  gland lesions. The bony thorax is intact. No bone lesions or fractures. IMPRESSION: 1. Interval decrease in size of the left hilar/medial left upper lobe lesion. 2. Stable 12 mm smoothly marginated well circumscribed subpleural nodule in the right upper lobe. 3. No new pulmonary nodules to suggest metastatic disease. 4. Stable scarring changes in the left upper lobe and left lower lobe which could be related to radiation treatment. 5. Stable small mediastinal and bilateral hilar nodes. 6. No findings for upper abdominal metastatic disease. 7. Aortic and coronary artery atherosclerosis. Aortic Atherosclerosis (ICD10-I70.0).  Electronically Signed   By: Marrian Siva M.D.   On: 03/15/2024 20:00      IMPRESSION: Stage IIIA (T3, N2, M0) non-small cell lung cancer, favoring adenocarcinoma presented with left hilar soft tissue mass in addition to left hilar and mediastinal lymphadenopathy diagnosed in January 2025 - s/p 3 cycles of neoadjuvant chemo-immunotherapy   We have reviewed this patient's current work-up. Most recent imaging demonstrates a partial response to neoadjuvant chemo-immunotherapy. Given the tumor's proximity to the heart, Mr. Andres is not a surgical candidate. Dr. Eloise Hake is in agreement with concurrent chemoradiation to the left lung mass in addition to the left hilar and mediastinal lymphadenopathy.   Today, we talked to the patient and family about the findings and work-up thus far.  We discussed the natural history of stage III lung cancer and general treatment, highlighting the role of radiotherapy in the management.  We discussed the available radiation techniques, and focused on the  details of logistics and delivery.  We reviewed the anticipated acute and late sequelae associated with radiation in this setting. Given his history of esophageal issues, we discussed that he is at an increased for complications from the radiation treatment. The patient was encouraged to ask questions that we answered to the best of our ability. A patient consent form was discussed and signed.  We retained a copy for our records.  The patient would like to proceed with radiation and will be scheduled for CT simulation.  PLAN: Patient is scheduled for CT simulation on 04/04/2024 and his first chemotherapy infusion on 04/12/2024. Anticipate approximately 6.5 weeks of radiation to the left lung mass and surrounding adenopathy.   We look forward to participating in this patient's care.   60 minutes of total time was spent for this patient encounter, including preparation, face-to-face counseling with the patient and coordination of care, physical exam, and documentation of the encounter.   ------------------------------------------------   Julio Ohm, PA-C   Noralee Beam, PhD, MD   [facility]  Radiation Oncology Direct Dial: [phone]  Fax: [phone] [email or web address]   This document serves as a record of services personally performed by Retta Caster, MD and Julio Ohm, PA-C. It was created on his behalf by Aleta Anda, a trained medical scribe. The creation of this record is based on the scribe's personal observations and the provider's statements to them. This document has been checked and approved by the attending provider.

## 2024-04-03 ENCOUNTER — Ambulatory Visit
Admission: RE | Admit: 2024-04-03 | Discharge: 2024-04-03 | Disposition: A | Discharge status: Home / Self Care | Source: Ambulatory Visit | Attending: Radiation Oncology | Admitting: Radiation Oncology

## 2024-04-03 ENCOUNTER — Encounter: Payer: Self-pay | Admitting: Radiation Oncology

## 2024-04-03 VITALS — BP 112/89 | HR 84 | Temp 97.3°F | Resp 18 | Ht 75.0 in | Wt 246.2 lb

## 2024-04-03 DIAGNOSIS — Z87891 Personal history of nicotine dependence: Secondary | ICD-10-CM | POA: Insufficient documentation

## 2024-04-03 DIAGNOSIS — R059 Cough, unspecified: Secondary | ICD-10-CM | POA: Diagnosis not present

## 2024-04-03 DIAGNOSIS — C3412 Malignant neoplasm of upper lobe, left bronchus or lung: Secondary | ICD-10-CM

## 2024-04-03 DIAGNOSIS — Z8673 Personal history of transient ischemic attack (TIA), and cerebral infarction without residual deficits: Secondary | ICD-10-CM | POA: Insufficient documentation

## 2024-04-03 DIAGNOSIS — I2510 Atherosclerotic heart disease of native coronary artery without angina pectoris: Secondary | ICD-10-CM | POA: Insufficient documentation

## 2024-04-03 DIAGNOSIS — J449 Chronic obstructive pulmonary disease, unspecified: Secondary | ICD-10-CM | POA: Insufficient documentation

## 2024-04-03 DIAGNOSIS — Z79899 Other long term (current) drug therapy: Secondary | ICD-10-CM | POA: Diagnosis not present

## 2024-04-03 DIAGNOSIS — M1990 Unspecified osteoarthritis, unspecified site: Secondary | ICD-10-CM | POA: Insufficient documentation

## 2024-04-03 DIAGNOSIS — I10 Essential (primary) hypertension: Secondary | ICD-10-CM | POA: Insufficient documentation

## 2024-04-03 DIAGNOSIS — I700 Atherosclerosis of aorta: Secondary | ICD-10-CM | POA: Diagnosis not present

## 2024-04-03 DIAGNOSIS — Z9221 Personal history of antineoplastic chemotherapy: Secondary | ICD-10-CM | POA: Diagnosis not present

## 2024-04-04 ENCOUNTER — Ambulatory Visit: Admitting: Radiation Oncology

## 2024-04-04 NOTE — Progress Notes (Signed)
 Pharmacist Chemotherapy Monitoring - Initial Assessment    Anticipated start date: 04/12/24   The following has been reviewed per standard work regarding the patient's treatment regimen: The patient's diagnosis, treatment plan and drug doses, and organ/hematologic function Lab orders and baseline tests specific to treatment regimen  The treatment plan start date, drug sequencing, and pre-medications Prior authorization status  Patient's documented medication list, including drug-drug interaction screen and prescriptions for anti-emetics and supportive care specific to the treatment regimen The drug concentrations, fluid compatibility, administration routes, and timing of the medications to be used The patient's access for treatment and lifetime cumulative dose history, if applicable  The patient's medication allergies and previous infusion related reactions, if applicable   Changes made to treatment plan:  N/A  Follow up needed:  Home antiemetics   Ianmichael Amescua, PharmD, MBA

## 2024-04-05 ENCOUNTER — Ambulatory Visit
Admission: RE | Admit: 2024-04-05 | Discharge: 2024-04-05 | Disposition: A | Discharge status: Home / Self Care | Source: Ambulatory Visit | Attending: Radiation Oncology | Admitting: Radiation Oncology

## 2024-04-05 DIAGNOSIS — C3412 Malignant neoplasm of upper lobe, left bronchus or lung: Secondary | ICD-10-CM | POA: Insufficient documentation

## 2024-04-05 DIAGNOSIS — Z510 Encounter for antineoplastic radiation therapy: Secondary | ICD-10-CM | POA: Diagnosis present

## 2024-04-05 DIAGNOSIS — Z5111 Encounter for antineoplastic chemotherapy: Secondary | ICD-10-CM | POA: Diagnosis not present

## 2024-04-07 DIAGNOSIS — C3412 Malignant neoplasm of upper lobe, left bronchus or lung: Secondary | ICD-10-CM | POA: Diagnosis not present

## 2024-04-11 ENCOUNTER — Other Ambulatory Visit: Payer: Self-pay

## 2024-04-11 ENCOUNTER — Ambulatory Visit

## 2024-04-11 ENCOUNTER — Ambulatory Visit
Admission: RE | Admit: 2024-04-11 | Discharge: 2024-04-11 | Disposition: A | Discharge status: Home / Self Care | Source: Ambulatory Visit | Attending: Radiation Oncology | Admitting: Radiation Oncology

## 2024-04-11 DIAGNOSIS — C3412 Malignant neoplasm of upper lobe, left bronchus or lung: Secondary | ICD-10-CM

## 2024-04-11 DIAGNOSIS — R911 Solitary pulmonary nodule: Secondary | ICD-10-CM

## 2024-04-11 LAB — RAD ONC ARIA SESSION SUMMARY
Course Elapsed Days: 0
Plan Fractions Treated to Date: 1
Plan Prescribed Dose Per Fraction: 2 Gy
Plan Total Fractions Prescribed: 30
Plan Total Prescribed Dose: 60 Gy
Reference Point Dosage Given to Date: 2 Gy
Reference Point Session Dosage Given: 2 Gy
Session Number: 1

## 2024-04-11 MED ORDER — SONAFINE EX EMUL
1.0000 | Freq: Once | CUTANEOUS | Status: AC
  Administered 2024-04-11: 1 via TOPICAL

## 2024-04-12 ENCOUNTER — Inpatient Hospital Stay

## 2024-04-12 ENCOUNTER — Other Ambulatory Visit: Payer: Self-pay

## 2024-04-12 ENCOUNTER — Ambulatory Visit
Admission: RE | Admit: 2024-04-12 | Discharge: 2024-04-12 | Disposition: A | Discharge status: Home / Self Care | Source: Ambulatory Visit | Attending: Radiation Oncology | Admitting: Radiation Oncology

## 2024-04-12 ENCOUNTER — Ambulatory Visit (HOSPITAL_BASED_OUTPATIENT_CLINIC_OR_DEPARTMENT_OTHER): Admitting: Physician Assistant

## 2024-04-12 ENCOUNTER — Other Ambulatory Visit: Payer: Self-pay | Admitting: Internal Medicine

## 2024-04-12 VITALS — BP 155/93 | HR 82 | Resp 18

## 2024-04-12 VITALS — BP 158/86 | HR 68 | Temp 97.9°F | Resp 18 | Wt 234.5 lb

## 2024-04-12 DIAGNOSIS — T50905A Adverse effect of unspecified drugs, medicaments and biological substances, initial encounter: Secondary | ICD-10-CM | POA: Diagnosis not present

## 2024-04-12 DIAGNOSIS — C3412 Malignant neoplasm of upper lobe, left bronchus or lung: Secondary | ICD-10-CM

## 2024-04-12 DIAGNOSIS — T451X5A Adverse effect of antineoplastic and immunosuppressive drugs, initial encounter: Secondary | ICD-10-CM

## 2024-04-12 DIAGNOSIS — M549 Dorsalgia, unspecified: Secondary | ICD-10-CM | POA: Diagnosis not present

## 2024-04-12 LAB — CBC WITH DIFFERENTIAL (CANCER CENTER ONLY)
Abs Immature Granulocytes: 0.01 10*3/uL (ref 0.00–0.07)
Basophils Absolute: 0 10*3/uL (ref 0.0–0.1)
Basophils Relative: 0 %
Eosinophils Absolute: 0.1 10*3/uL (ref 0.0–0.5)
Eosinophils Relative: 2 %
HCT: 40.8 % (ref 39.0–52.0)
Hemoglobin: 13.9 g/dL (ref 13.0–17.0)
Immature Granulocytes: 0 %
Lymphocytes Relative: 40 %
Lymphs Abs: 3 10*3/uL (ref 0.7–4.0)
MCH: 31.7 pg (ref 26.0–34.0)
MCHC: 34.1 g/dL (ref 30.0–36.0)
MCV: 92.9 fL (ref 80.0–100.0)
Monocytes Absolute: 0.4 10*3/uL (ref 0.1–1.0)
Monocytes Relative: 5 %
Neutro Abs: 4 10*3/uL (ref 1.7–7.7)
Neutrophils Relative %: 53 %
Platelet Count: 230 10*3/uL (ref 150–400)
RBC: 4.39 MIL/uL (ref 4.22–5.81)
RDW: 14.4 % (ref 11.5–15.5)
WBC Count: 7.6 10*3/uL (ref 4.0–10.5)
nRBC: 0 % (ref 0.0–0.2)

## 2024-04-12 LAB — RAD ONC ARIA SESSION SUMMARY
Course Elapsed Days: 1
Plan Fractions Treated to Date: 2
Plan Prescribed Dose Per Fraction: 2 Gy
Plan Total Fractions Prescribed: 30
Plan Total Prescribed Dose: 60 Gy
Reference Point Dosage Given to Date: 4 Gy
Reference Point Session Dosage Given: 2 Gy
Session Number: 2

## 2024-04-12 LAB — CMP (CANCER CENTER ONLY)
ALT: 23 U/L (ref 0–44)
AST: 25 U/L (ref 15–41)
Albumin: 4.4 g/dL (ref 3.5–5.0)
Alkaline Phosphatase: 53 U/L (ref 38–126)
Anion gap: 7 (ref 5–15)
BUN: 16 mg/dL (ref 8–23)
CO2: 27 mmol/L (ref 22–32)
Calcium: 9.7 mg/dL (ref 8.9–10.3)
Chloride: 105 mmol/L (ref 98–111)
Creatinine: 0.86 mg/dL (ref 0.61–1.24)
GFR, Estimated: >60 mL/min (ref >60)
Glucose, Bld: 116 mg/dL — ABNORMAL HIGH (ref 70–99)
Potassium: 4.1 mmol/L (ref 3.5–5.1)
Sodium: 139 mmol/L (ref 135–145)
Total Bilirubin: 0.5 mg/dL (ref 0.0–1.2)
Total Protein: 7.7 g/dL (ref 6.5–8.1)

## 2024-04-12 MED ORDER — DEXAMETHASONE SODIUM PHOSPHATE 10 MG/ML IJ SOLN
10.0000 mg | Freq: Once | INTRAMUSCULAR | Status: AC
  Administered 2024-04-12: 10 mg via INTRAVENOUS
  Filled 2024-04-12: qty 1

## 2024-04-12 MED ORDER — FAMOTIDINE IN NACL 20-0.9 MG/50ML-% IV SOLN
20.0000 mg | Freq: Once | INTRAVENOUS | Status: AC
  Administered 2024-04-12: 20 mg via INTRAVENOUS
  Filled 2024-04-12: qty 50

## 2024-04-12 MED ORDER — SODIUM CHLORIDE 0.9 % IV SOLN
239.8000 mg | Freq: Once | INTRAVENOUS | Status: AC
  Administered 2024-04-12: 239.8 mg via INTRAVENOUS
  Filled 2024-04-12: qty 23.98

## 2024-04-12 MED ORDER — METHYLPREDNISOLONE SODIUM SUCC 125 MG IJ SOLR
125.0000 mg | Freq: Once | INTRAMUSCULAR | Status: AC | PRN
  Administered 2024-04-12: 62.5 mg via INTRAVENOUS

## 2024-04-12 MED ORDER — FAMOTIDINE IN NACL 20-0.9 MG/50ML-% IV SOLN
20.0000 mg | Freq: Once | INTRAVENOUS | Status: AC | PRN
  Administered 2024-04-12: 20 mg via INTRAVENOUS

## 2024-04-12 MED ORDER — PALONOSETRON HCL INJECTION 0.25 MG/5ML
0.2500 mg | Freq: Once | INTRAVENOUS | Status: AC
  Administered 2024-04-12: 0.25 mg via INTRAVENOUS
  Filled 2024-04-12: qty 5

## 2024-04-12 MED ORDER — SODIUM CHLORIDE 0.9 % IV SOLN
45.0000 mg/m2 | Freq: Once | INTRAVENOUS | Status: AC
  Administered 2024-04-12: 108 mg via INTRAVENOUS
  Filled 2024-04-12: qty 18

## 2024-04-12 MED ORDER — PROCHLORPERAZINE MALEATE 10 MG PO TABS: 10.0000 mg | ORAL_TABLET | Freq: Four times a day (QID) | ORAL | 0 refills | Status: AC | PRN

## 2024-04-12 MED ORDER — SODIUM CHLORIDE 0.9 % IV SOLN: INTRAVENOUS | Status: DC

## 2024-04-12 MED ORDER — DIPHENHYDRAMINE HCL 50 MG/ML IJ SOLN
50.0000 mg | Freq: Once | INTRAMUSCULAR | Status: AC
  Administered 2024-04-12: 50 mg via INTRAVENOUS
  Filled 2024-04-12: qty 1

## 2024-04-12 NOTE — Progress Notes (Signed)
    DATE:  04/12/24                                        X CHEMO/IMMUNOTHERAPY REACTION           MD: Marguerita Shih   AGENT/BLOOD PRODUCT RECEIVING TODAY:              Taxol  and carboplatin    AGENT/BLOOD PRODUCT RECEIVING IMMEDIATELY PRIOR TO REACTION:          taxol     Vitals:   04/12/24 1455 04/12/24 1500  BP: (!) 163/94 (!) 155/93  Pulse: 87 82  Resp: 18 18  SpO2: 98% 98%      REACTION(S):           back pain   PREMEDS:     Pepcid  20 mg IV, Decadron  10 mg IV, Benadryl  50 mg IV,  Aloxi  0.25 mg IV   INTERVENTION: Pepcid  20 mg IV, 62 mg solu-medrol  IV   Review of Systems  Review of Systems  Musculoskeletal:  Positive for back pain.  All other systems reviewed and are negative.    Physical Exam  Physical Exam Vitals and nursing note reviewed.  Constitutional:      General: He is in acute distress.     Appearance: He is not toxic-appearing.  HENT:     Head: Normocephalic.  Eyes:     Conjunctiva/sclera: Conjunctivae normal.  Cardiovascular:     Rate and Rhythm: Normal rate and regular rhythm.     Pulses: Normal pulses.     Heart sounds: Normal heart sounds.  Pulmonary:     Effort: Pulmonary effort is normal.     Breath sounds: Normal breath sounds.  Abdominal:     General: There is no distension.  Musculoskeletal:     Cervical back: Normal range of motion.  Skin:    General: Skin is warm and dry.  Neurological:     Mental Status: He is alert.     OUTCOME:                  Patient became symptomatic 15 minutes into first time taxol  infusion with severe back pain. Emergency medications were administered as documented above. Patient returned to baseline. Oncologist notified and recommends changing therapy to Abraxane . Patient will proceed with carboplatin  as planned today. Message sent to pharmacy asking to help with treatment plan changes.   I have spent a total of 10 minutes minutes of face-to-face and non-face-to-face time preparing to see the patient,  performing a medically appropriate examination, counseling and educating the patient, ordering medications, documenting clinical information in the electronic health record, and care coordination.

## 2024-04-12 NOTE — Patient Instructions (Signed)
 [facility] - A DEPT OF [facility]  Discharge Instructions: Thank you for choosing [facility] to provide your oncology and hematology care.   If you have a lab appointment with the Cancer Center, please go directly to the Cancer Center and check in at the registration area.   Wear comfortable clothing and clothing appropriate for easy access to any Portacath or PICC line.   We strive to give you quality time with your provider. You may need to reschedule your appointment if you arrive late (15 or more minutes).  Arriving late affects you and other patients whose appointments are after yours.  Also, if you miss three or more appointments without notifying the office, you may be dismissed from the clinic at the provider's discretion.      For prescription refill requests, have your pharmacy contact our office and allow 72 hours for refills to be completed.    Today you received the following chemotherapy and/or immunotherapy agents Taxol, Carboplatin      To help prevent nausea and vomiting after your treatment, we encourage you to take your nausea medication as directed.  BELOW ARE SYMPTOMS THAT SHOULD BE REPORTED IMMEDIATELY: *FEVER GREATER THAN 100.4 F (38 C) OR HIGHER *CHILLS OR SWEATING *NAUSEA AND VOMITING THAT IS NOT CONTROLLED WITH YOUR NAUSEA MEDICATION *UNUSUAL SHORTNESS OF BREATH *UNUSUAL BRUISING OR BLEEDING *URINARY PROBLEMS (pain or burning when urinating, or frequent urination) *BOWEL PROBLEMS (unusual diarrhea, constipation, pain near the anus) TENDERNESS IN MOUTH AND THROAT WITH OR WITHOUT PRESENCE OF ULCERS (sore throat, sores in mouth, or a toothache) UNUSUAL RASH, SWELLING OR PAIN  UNUSUAL VAGINAL DISCHARGE OR ITCHING   Items with * indicate a potential emergency and should be followed up as soon as possible or go to the Emergency Department if any problems should occur.  Please show the CHEMOTHERAPY ALERT CARD or  IMMUNOTHERAPY ALERT CARD at check-in to the Emergency Department and triage nurse.  Should you have questions after your visit or need to cancel or reschedule your appointment, please contact [facility] - A DEPT OF [facility]  Dept: [phone]  and follow the prompts.  Office hours are 8:00 a.m. to 4:30 p.m. Monday - Friday. Please note that voicemails left after 4:00 p.m. may not be returned until the following business day.  We are closed weekends and major holidays. You have access to a nurse at all times for urgent questions. Please call the main number to the clinic Dept: [phone] and follow the prompts.   For any non-urgent questions, you may also contact your provider using MyChart. We now offer e-Visits for anyone 64 and older to request care online for non-urgent symptoms. For details visit [email or web address].   Also download the MyChart app! Go to the app store, search "MyChart", open the app, select [facility], and log in with your MyChart username and password.

## 2024-04-12 NOTE — Progress Notes (Signed)
 Hypersensitivity Reaction note  Date of event: 04/12/24 Time of event: 1452 Generic name of drug involved: Paclitaxel Name of provider notified of the hypersensitivity reaction: Angeline Kemps., PA & Marguerita Shih, MD Was agent that likely caused hypersensitivity reaction added to Allergies List within EMR? Yes Chain of events including reaction signs/symptoms, treatment administered, and outcome (e.g., drug resumed; drug discontinued; sent to Emergency Department; etc.)  1452: 15 minutes after starting Taxol, patient complained of severe back pain, 10/10, radiating down legs, bilaterally. This RN stopped Taxol, started IVF and called Angeline Kemps., PA. Pepcid  given. Patient denies any SOB, chest pain or difficulty breathing 1454: VS obtained, see flowsheet. 1456: Back pain continues, 10/10 per patient, no improvement. No new symptoms. Solu-medrol given, see MAR. 1459 : VS obtained, see flowsheet. Symptoms improving per patient. 1505: Symptoms resolved per patient, VS obtained, see flowsheet. MD notified. Per MD, we will discontinue Taxol today and obtain authorization for Abraxane in the future. Patient aware of plan and agreeable. Will continue with Carboplatin  only today.   Arlene Lacy, RN 04/12/2024 3:32 PM

## 2024-04-13 ENCOUNTER — Ambulatory Visit
Admission: RE | Admit: 2024-04-13 | Discharge: 2024-04-13 | Disposition: A | Discharge status: Home / Self Care | Source: Ambulatory Visit | Attending: Radiation Oncology | Admitting: Radiation Oncology

## 2024-04-13 ENCOUNTER — Other Ambulatory Visit: Payer: Self-pay

## 2024-04-13 DIAGNOSIS — C3412 Malignant neoplasm of upper lobe, left bronchus or lung: Secondary | ICD-10-CM | POA: Diagnosis not present

## 2024-04-13 LAB — RAD ONC ARIA SESSION SUMMARY
Course Elapsed Days: 2
Plan Fractions Treated to Date: 3
Plan Prescribed Dose Per Fraction: 2 Gy
Plan Total Fractions Prescribed: 30
Plan Total Prescribed Dose: 60 Gy
Reference Point Dosage Given to Date: 6 Gy
Reference Point Session Dosage Given: 2 Gy
Session Number: 3

## 2024-04-14 ENCOUNTER — Other Ambulatory Visit: Payer: Self-pay

## 2024-04-14 ENCOUNTER — Encounter: Payer: Self-pay | Admitting: Internal Medicine

## 2024-04-14 ENCOUNTER — Ambulatory Visit
Admission: RE | Admit: 2024-04-14 | Discharge: 2024-04-14 | Disposition: A | Discharge status: Home / Self Care | Source: Ambulatory Visit | Attending: Radiation Oncology | Admitting: Radiation Oncology

## 2024-04-14 DIAGNOSIS — C3412 Malignant neoplasm of upper lobe, left bronchus or lung: Secondary | ICD-10-CM | POA: Diagnosis not present

## 2024-04-14 LAB — RAD ONC ARIA SESSION SUMMARY
Course Elapsed Days: 3
Plan Fractions Treated to Date: 4
Plan Prescribed Dose Per Fraction: 2 Gy
Plan Total Fractions Prescribed: 30
Plan Total Prescribed Dose: 60 Gy
Reference Point Dosage Given to Date: 8 Gy
Reference Point Session Dosage Given: 2 Gy
Session Number: 4

## 2024-04-14 NOTE — Progress Notes (Signed)
 [facility] OFFICE PROGRESS NOTE  [name], PA-C [address] [place] [address]  DIAGNOSIS: Stage IIIA (T3, N2, M0) non-small cell lung cancer, favoring adenocarcinoma presented with left hilar soft tissue mass in addition to left hilar and mediastinal lymphadenopathy diagnosed in January 2025.   PRIOR THERAPY: Neoadjuvant systemic chemotherapy with carboplatin  for AUC of 5, Alimta 500 Mg/M2 and nivolumab  360 Mg IV every 3 weeks.  First dose December 30, 2023.  Status post 3 cycles.  CURRENT THERAPY: Concurrent chemoradiation with carboplatin  for an AUC of 2.  The patient had a reaction to paclitaxel  and this will be switched to Abraxane  45 mg/m2 starting from cycle #2. First dose on 04/12/24   INTERVAL HISTORY: Jackson Powers 78 y.o. male returns to the clinic today for a follow up visit. The patient was seen in the clinic by 03/30/24. He is currently undergoing a course of concurrent chemoradiation.  He is status post his first week of treatment he had a reaction to Taxol .  Therefore, his Taxol  has been discontinued and changed to Abraxane . His symptoms  included acute distress and back pain.   The patient otherwise denies any major changes in his health since he was last seen.  He denies any fever, chills, night sweats, or unexplained weight loss.  He denies any chest pain, shortness of breath, cough, or hemoptysis. He states he has to really be overexerting for him to have shortness of breath. He had some constipation after his infusion but took laxatives. He takes stool softener. He denies any nausea, vomiting, diarrhea, or constipation.  He is here today for evaluation and repeat blood work before undergoing cycle #2 which is scheduled for tomorrow  MEDICAL HISTORY: Past Medical History:  Diagnosis Date   Acute gastritis without hemorrhage 2007   Arthritis    osteoarthritis    Benign neoplasm of colon 2007   COPD (chronic obstructive pulmonary disease) (HCC)     Esophagitis 2007   Hypertension    Orthopnea    reports " i have trouble breathing when i lie completely flat. i have to use 2 people to prop my head up"    Panic anxiety syndrome    patient reports having severe panic anxiety and claustraphobia. states with ear surgery years ago , they had to tie him down which caused him to panic even more;    Right BBB/left ant fasc block    (OLD)   TIA (transient ischemic attack)     ALLERGIES:  is allergic to fosaprepitant  and paclitaxel .  MEDICATIONS:  Current Outpatient Medications  Medication Sig Dispense Refill   amLODipine  (NORVASC ) 10 MG tablet Take 10 mg by mouth daily.     fluticasone  (FLONASE ) 50 MCG/ACT nasal spray SPRAY 1 SPRAY INTO BOTH NOSTRILS DAILY. 16 mL 2   Fluticasone -Umeclidin-Vilant (TRELEGY ELLIPTA ) 100-62.5-25 MCG/ACT AEPB TAKE 1 PUFF BY MOUTH EVERY DAY 60 each 3   Fluticasone -Umeclidin-Vilant (TRELEGY ELLIPTA ) 100-62.5-25 MCG/ACT AEPB 2 samples     metoprolol succinate (TOPROL-XL) 25 MG 24 hr tablet Take 25 mg by mouth daily.     prochlorperazine  (COMPAZINE ) 10 MG tablet Take 1 tablet (10 mg total) by mouth every 6 (six) hours as needed for nausea or vomiting. 30 tablet 0   No current facility-administered medications for this visit.   Facility-Administered Medications Ordered in Other Visits  Medication Dose Route Frequency Provider Last Rate Last Admin   sodium chloride  flush (NS) 0.9 % injection 3 mL  3 mL Intravenous Q12H  Cherrie Cornwall, MD        SURGICAL HISTORY:  Past Surgical History:  Procedure Laterality Date   BRONCHIAL BIOPSY  12/02/2023   Procedure: BRONCHIAL BIOPSIES;  Surgeon: Vergia Glasgow, MD;  Location: [facility] END[facility]COPY;  Service: Pulmonary;;   BRONCHIAL BRUSHINGS  12/02/2023   Procedure: BRONCHIAL BRUSHINGS;  Surgeon: Vergia Glasgow, MD;  Location: [facility] ENDOSCOPY;  Service: Pulmonary;;   BRONCHIAL NEEDLE ASPIRATION BIOPSY  12/02/2023   Procedure: BRONCHIAL NEEDLE ASPIRATION BIOPSIES;  Surgeon: Vergia Glasgow, MD;  Location: [facility] ENDOSCOPY;  Service: Pulmonary;;   BRONCHIAL WASHINGS  12/02/2023   Procedure: BRONCHIAL WASHINGS;  Surgeon: Vergia Glasgow, MD;  Location: [facility] ENDOSCOPY;  Service: Pulmonary;;   LEFT HEART CATH AND CORONARY ANGIOGRAPHY Left 10/01/2020   Procedure: LEFT HEART CATH AND CORONARY ANGIOGRAPHY;  Surgeon: Cherrie Cornwall, MD;  Location: [facility] INVASIVE CV LAB;  Service: Cardiovascular;  Laterality: Left;   SKIN CANCER EXCISION Right 2017   EAR; FAILED GRAFT FOLLWOING SURGERY    TOTAL HIP ARTHROPLASTY Right 07/20/2018   Procedure: RIGHT TOTAL HIP ARTHROPLASTY ANTERIOR APPROACH;  Surgeon: Liliane Rei, MD;  Location: [facility];  Service: Orthopedics;  Laterality: Right;   VIDEO BRONCHOSCOPY WITH ENDOBRONCHIAL ULTRASOUND  12/02/2023   Procedure: VIDEO BRONCHOSCOPY WITH ENDOBRONCHIAL ULTRASOUND;  Surgeon: Vergia Glasgow, MD;  Location: [facility] ENDOS[facility];  Service: Pulmonary;;    REVIEW OF SYSTEMS:   Review of Systems  Constitutional: Negative for appetite change, chills, fatigue, fever and unexpected weight change.  HENT:   Negative for mouth sores, nosebleeds, sore throat and trouble swallowing.   Eyes: Negative for eye problems and icterus.  Respiratory: Negative for cough, hemoptysis, shortness of breath and wheezing.   Cardiovascular: Negative for chest pain and leg swelling.  Gastrointestinal: Positive for occasional constipation. Negative for abdominal pain, diarrhea, nausea and vomiting.  Genitourinary: Negative for bladder incontinence, difficulty urinating, dysuria, frequency and hematuria.   Musculoskeletal: Negative for back pain, gait problem, neck pain and neck stiffness.  Skin: Negative for itching and rash.  Neurological: Negative for dizziness, extremity weakness, gait problem, headaches, light-headedness and seizures.  Hematological: Negative for adenopathy. Does not bruise/bleed easily.  Psychiatric/Behavioral: Negative for confusion, depression and sleep disturbance.  The patient is not nervous/anxious.     PHYSICAL EXAMINATION:  Blood pressure (!) 146/83, pulse 72, temperature 98.3 F (36.8 C), temperature source Temporal, resp. rate 13, weight 241 lb 8 oz (109.5 kg), SpO2 96%.  ECOG PERFORMANCE STATUS: 1  Physical Exam  Constitutional: Oriented to person, place, and time and well-developed, well-nourished, and in no distress.   HENT:  Head: Normocephalic and atraumatic.  Mouth/Throat: Oropharynx is clear and moist. No oropharyngeal exudate.  Eyes: Conjunctivae are normal. Right eye exhibits no discharge. Left eye exhibits no discharge. No scleral icterus.  Neck: Normal range of motion. Neck supple.  Cardiovascular: Normal rate, regular rhythm, normal heart sounds and intact distal pulses.   Pulmonary/Chest: Effort normal and breath sounds normal. No respiratory distress. No wheezes. No rales.  Abdominal: Soft. Bowel sounds are normal. Exhibits no distension and no mass. There is no tenderness.  Musculoskeletal: Normal range of motion. Exhibits no edema.  Lymphadenopathy:    No cervical adenopathy.  Neurological: Alert and oriented to person, place, and time. Exhibits normal muscle tone. Gait normal. Coordination normal.  Skin: Skin is warm and dry. No rash noted. Not diaphoretic. No erythema. No pallor.  Psychiatric: Mood, memory and judgment normal.  Vitals reviewed.  LABORATORY DATA: Lab Results  Component Value Date  WBC 6.0 04/18/2024   HGB 13.6 04/18/2024   HCT 40.0 04/18/2024   MCV 93.0 04/18/2024   PLT 187 04/18/2024      Chemistry      Component Value Date/Time   NA 139 04/18/2024 1149   K 4.4 04/18/2024 1149   CL 105 04/18/2024 1149   CO2 28 04/18/2024 1149   BUN 22 04/18/2024 1149   CREATININE 0.93 04/18/2024 1149      Component Value Date/Time   CALCIUM  9.5 04/18/2024 1149   ALKPHOS 53 04/18/2024 1149   AST 26 04/18/2024 1149   ALT 29 04/18/2024 1149   BILITOT 0.6 04/18/2024 1149       RADIOGRAPHIC  STUDIES:  No results found.   ASSESSMENT/PLAN:  This is a very pleasant 78 year old Caucasian male with Stage IIIA (T3, N2, M0) non-small cell lung cancer, favoring adenocarcinoma presented with left hilar soft tissue mass in addition to left hilar and mediastinal lymphadenopathy diagnosed in January 2025.  He underwent neoadjuvant systemic chemotherapy with carboplatin  for AUC of 5, Alimta 500 Mg/M2 and nivolumab  360 Mg IV every 3 weeks status post 3 cycles.  He had been tolerating this treatment fairly well. The patient was not a good surgical candidate for resection.   Therefore he was started on concurrent chemoradiation with carboplatin  for an AUC of 2 and paclitaxel  45 mg/m.  His first dose of treatment was on 04/12/2024.  He had a reaction to Taxol  and this has since been switched to Abraxane  45 mg/m2.   Labs were reviewed.  Recommend that he proceed with cycle #2 tomorrow as scheduled.  We will see him back for follow-up visit in 2 weeks for evaluation and repeat blood work before undergoing cycle #4.  His constipation is managed with stool softeners and laxatives.   Dysphagia due to radiation therapy (mild) Dysphagia from radiation therapy, a common side effect with cumulative doses.  -Overall, minimal symptoms at this time. He knows to inform radiation oncology team if dysphagia worsens to receive appropriate treatment, such as carafate to ease swallowing  The patient was advised to call immediately if he has any concerning symptoms in the interval. The patient voices understanding of current disease status and treatment options and is in agreement with the current care plan. All questions were answered. The patient knows to call the clinic with any problems, questions or concerns. We can certainly see the patient much sooner if necessary        No orders of the defined types were placed in this encounter.    The total time spent in the appointment was 20-29  minutes  Jackson Powers L Marium Ragan, PA-C 04/18/24

## 2024-04-15 ENCOUNTER — Other Ambulatory Visit: Payer: Self-pay

## 2024-04-17 ENCOUNTER — Ambulatory Visit
Admission: RE | Admit: 2024-04-17 | Discharge: 2024-04-17 | Disposition: A | Discharge status: Home / Self Care | Source: Ambulatory Visit | Attending: Radiation Oncology | Admitting: Radiation Oncology

## 2024-04-17 ENCOUNTER — Other Ambulatory Visit: Payer: Self-pay

## 2024-04-17 DIAGNOSIS — Z5111 Encounter for antineoplastic chemotherapy: Secondary | ICD-10-CM | POA: Insufficient documentation

## 2024-04-17 DIAGNOSIS — Z510 Encounter for antineoplastic radiation therapy: Secondary | ICD-10-CM | POA: Insufficient documentation

## 2024-04-17 DIAGNOSIS — C3412 Malignant neoplasm of upper lobe, left bronchus or lung: Secondary | ICD-10-CM | POA: Diagnosis present

## 2024-04-17 LAB — RAD ONC ARIA SESSION SUMMARY
Course Elapsed Days: 6
Plan Fractions Treated to Date: 5
Plan Prescribed Dose Per Fraction: 2 Gy
Plan Total Fractions Prescribed: 30
Plan Total Prescribed Dose: 60 Gy
Reference Point Dosage Given to Date: 10 Gy
Reference Point Session Dosage Given: 2 Gy
Session Number: 5

## 2024-04-18 ENCOUNTER — Inpatient Hospital Stay: Attending: Physician Assistant | Admitting: Physician Assistant

## 2024-04-18 ENCOUNTER — Ambulatory Visit
Admission: RE | Admit: 2024-04-18 | Discharge: 2024-04-18 | Disposition: A | Discharge status: Home / Self Care | Source: Ambulatory Visit | Attending: Radiation Oncology | Admitting: Radiation Oncology

## 2024-04-18 ENCOUNTER — Other Ambulatory Visit: Payer: Self-pay

## 2024-04-18 ENCOUNTER — Other Ambulatory Visit: Payer: Self-pay | Admitting: Internal Medicine

## 2024-04-18 ENCOUNTER — Inpatient Hospital Stay

## 2024-04-18 VITALS — BP 146/83 | HR 72 | Temp 98.3°F | Resp 13 | Wt 241.5 lb

## 2024-04-18 DIAGNOSIS — Z79899 Other long term (current) drug therapy: Secondary | ICD-10-CM | POA: Insufficient documentation

## 2024-04-18 DIAGNOSIS — C3412 Malignant neoplasm of upper lobe, left bronchus or lung: Secondary | ICD-10-CM

## 2024-04-18 DIAGNOSIS — Z923 Personal history of irradiation: Secondary | ICD-10-CM | POA: Insufficient documentation

## 2024-04-18 DIAGNOSIS — Z5111 Encounter for antineoplastic chemotherapy: Secondary | ICD-10-CM | POA: Insufficient documentation

## 2024-04-18 LAB — CBC WITH DIFFERENTIAL (CANCER CENTER ONLY)
Abs Immature Granulocytes: 0.01 10*3/uL (ref 0.00–0.07)
Basophils Absolute: 0 10*3/uL (ref 0.0–0.1)
Basophils Relative: 0 %
Eosinophils Absolute: 0.1 10*3/uL (ref 0.0–0.5)
Eosinophils Relative: 2 %
HCT: 40 % (ref 39.0–52.0)
Hemoglobin: 13.6 g/dL (ref 13.0–17.0)
Immature Granulocytes: 0 %
Lymphocytes Relative: 36 %
Lymphs Abs: 2.1 10*3/uL (ref 0.7–4.0)
MCH: 31.6 pg (ref 26.0–34.0)
MCHC: 34 g/dL (ref 30.0–36.0)
MCV: 93 fL (ref 80.0–100.0)
Monocytes Absolute: 0.5 10*3/uL (ref 0.1–1.0)
Monocytes Relative: 8 %
Neutro Abs: 3.2 10*3/uL (ref 1.7–7.7)
Neutrophils Relative %: 54 %
Platelet Count: 187 10*3/uL (ref 150–400)
RBC: 4.3 MIL/uL (ref 4.22–5.81)
RDW: 13.8 % (ref 11.5–15.5)
WBC Count: 6 10*3/uL (ref 4.0–10.5)
nRBC: 0 % (ref 0.0–0.2)

## 2024-04-18 LAB — CMP (CANCER CENTER ONLY)
ALT: 29 U/L (ref 0–44)
AST: 26 U/L (ref 15–41)
Albumin: 4.5 g/dL (ref 3.5–5.0)
Alkaline Phosphatase: 53 U/L (ref 38–126)
Anion gap: 6 (ref 5–15)
BUN: 22 mg/dL (ref 8–23)
CO2: 28 mmol/L (ref 22–32)
Calcium: 9.5 mg/dL (ref 8.9–10.3)
Chloride: 105 mmol/L (ref 98–111)
Creatinine: 0.93 mg/dL (ref 0.61–1.24)
GFR, Estimated: >60 mL/min (ref >60)
Glucose, Bld: 101 mg/dL — ABNORMAL HIGH (ref 70–99)
Potassium: 4.4 mmol/L (ref 3.5–5.1)
Sodium: 139 mmol/L (ref 135–145)
Total Bilirubin: 0.6 mg/dL (ref 0.0–1.2)
Total Protein: 7.8 g/dL (ref 6.5–8.1)

## 2024-04-18 LAB — RAD ONC ARIA SESSION SUMMARY
Course Elapsed Days: 7
Plan Fractions Treated to Date: 6
Plan Prescribed Dose Per Fraction: 2 Gy
Plan Total Fractions Prescribed: 30
Plan Total Prescribed Dose: 60 Gy
Reference Point Dosage Given to Date: 12 Gy
Reference Point Session Dosage Given: 2 Gy
Session Number: 6

## 2024-04-19 ENCOUNTER — Ambulatory Visit
Admission: RE | Admit: 2024-04-19 | Discharge: 2024-04-19 | Disposition: A | Discharge status: Home / Self Care | Source: Ambulatory Visit | Attending: Radiation Oncology | Admitting: Radiation Oncology

## 2024-04-19 ENCOUNTER — Other Ambulatory Visit: Payer: Self-pay | Admitting: Radiation Oncology

## 2024-04-19 ENCOUNTER — Other Ambulatory Visit: Payer: Self-pay

## 2024-04-19 ENCOUNTER — Ambulatory Visit: Admitting: Physician Assistant

## 2024-04-19 ENCOUNTER — Encounter: Payer: Self-pay | Admitting: Internal Medicine

## 2024-04-19 ENCOUNTER — Ambulatory Visit

## 2024-04-19 ENCOUNTER — Other Ambulatory Visit

## 2024-04-19 ENCOUNTER — Inpatient Hospital Stay

## 2024-04-19 VITALS — BP 172/72 | HR 67 | Temp 97.7°F | Resp 18

## 2024-04-19 DIAGNOSIS — C3412 Malignant neoplasm of upper lobe, left bronchus or lung: Secondary | ICD-10-CM

## 2024-04-19 LAB — RAD ONC ARIA SESSION SUMMARY
Course Elapsed Days: 8
Plan Fractions Treated to Date: 7
Plan Prescribed Dose Per Fraction: 2 Gy
Plan Total Fractions Prescribed: 30
Plan Total Prescribed Dose: 60 Gy
Reference Point Dosage Given to Date: 14 Gy
Reference Point Session Dosage Given: 2 Gy
Session Number: 7

## 2024-04-19 MED ORDER — PACLITAXEL PROTEIN-BOUND CHEMO INJECTION 100 MG
45.0000 mg/m2 | Freq: Once | INTRAVENOUS | Status: AC
  Administered 2024-04-19: 100 mg via INTRAVENOUS
  Filled 2024-04-19: qty 20

## 2024-04-19 MED ORDER — SODIUM CHLORIDE 0.9 % IV SOLN: INTRAVENOUS | Status: DC

## 2024-04-19 MED ORDER — FAMOTIDINE IN NACL 20-0.9 MG/50ML-% IV SOLN
20.0000 mg | Freq: Once | INTRAVENOUS | Status: AC
  Administered 2024-04-19: 20 mg via INTRAVENOUS
  Filled 2024-04-19: qty 50

## 2024-04-19 MED ORDER — SODIUM CHLORIDE 0.9% FLUSH: 10.0000 mL | INTRAVENOUS | Status: DC | PRN

## 2024-04-19 MED ORDER — SUCRALFATE 1 G PO TABS: ORAL_TABLET | ORAL | 1 refills | Status: DC

## 2024-04-19 MED ORDER — SODIUM CHLORIDE 0.9 % IV SOLN
239.8000 mg | Freq: Once | INTRAVENOUS | Status: AC
  Administered 2024-04-19: 239.8 mg via INTRAVENOUS
  Filled 2024-04-19: qty 23.98

## 2024-04-19 MED ORDER — HEPARIN SOD (PORK) LOCK FLUSH 100 UNIT/ML IV SOLN
500.0000 [IU] | Freq: Once | INTRAVENOUS | Status: DC | PRN
Start: 2024-04-19 — End: 2024-04-19

## 2024-04-19 MED ORDER — PALONOSETRON HCL INJECTION 0.25 MG/5ML
0.2500 mg | Freq: Once | INTRAVENOUS | Status: AC
  Administered 2024-04-19: 0.25 mg via INTRAVENOUS
  Filled 2024-04-19: qty 5

## 2024-04-19 MED ORDER — DEXAMETHASONE SODIUM PHOSPHATE 10 MG/ML IJ SOLN
10.0000 mg | Freq: Once | INTRAMUSCULAR | Status: AC
  Administered 2024-04-19: 10 mg via INTRAVENOUS
  Filled 2024-04-19: qty 1

## 2024-04-19 NOTE — Patient Instructions (Signed)
 [facility] - A DEPT OF [facility]  Discharge Instructions: Thank you for choosing [facility] to provide your oncology and hematology care.   If you have a lab appointment with the Cancer Center, please go directly to the Cancer Center and check in at the registration area.   Wear comfortable clothing and clothing appropriate for easy access to any Portacath or PICC line.   We strive to give you quality time with your provider. You may need to reschedule your appointment if you arrive late (15 or more minutes).  Arriving late affects you and other patients whose appointments are after yours.  Also, if you miss three or more appointments without notifying the office, you may be dismissed from the clinic at the provider's discretion.      For prescription refill requests, have your pharmacy contact our office and allow 72 hours for refills to be completed.    Today you received the following chemotherapy and/or immunotherapy agents: Abraxane, Carboplatin      To help prevent nausea and vomiting after your treatment, we encourage you to take your nausea medication as directed.  BELOW ARE SYMPTOMS THAT SHOULD BE REPORTED IMMEDIATELY: *FEVER GREATER THAN 100.4 F (38 C) OR HIGHER *CHILLS OR SWEATING *NAUSEA AND VOMITING THAT IS NOT CONTROLLED WITH YOUR NAUSEA MEDICATION *UNUSUAL SHORTNESS OF BREATH *UNUSUAL BRUISING OR BLEEDING *URINARY PROBLEMS (pain or burning when urinating, or frequent urination) *BOWEL PROBLEMS (unusual diarrhea, constipation, pain near the anus) TENDERNESS IN MOUTH AND THROAT WITH OR WITHOUT PRESENCE OF ULCERS (sore throat, sores in mouth, or a toothache) UNUSUAL RASH, SWELLING OR PAIN  UNUSUAL VAGINAL DISCHARGE OR ITCHING   Items with * indicate a potential emergency and should be followed up as soon as possible or go to the Emergency Department if any problems should occur.  Please show the CHEMOTHERAPY ALERT CARD or  IMMUNOTHERAPY ALERT CARD at check-in to the Emergency Department and triage nurse.  Should you have questions after your visit or need to cancel or reschedule your appointment, please contact [facility] - A DEPT OF [facility]  Dept: [phone]  and follow the prompts.  Office hours are 8:00 a.m. to 4:30 p.m. Monday - Friday. Please note that voicemails left after 4:00 p.m. may not be returned until the following business day.  We are closed weekends and major holidays. You have access to a nurse at all times for urgent questions. Please call the main number to the clinic Dept: [phone] and follow the prompts.   For any non-urgent questions, you may also contact your provider using MyChart. We now offer e-Visits for anyone 33 and older to request care online for non-urgent symptoms. For details visit [email or web address].   Also download the MyChart app! Go to the app store, search "MyChart", open the app, select [place], and log in with your MyChart username and password.

## 2024-04-20 ENCOUNTER — Other Ambulatory Visit: Payer: Self-pay

## 2024-04-20 ENCOUNTER — Ambulatory Visit
Admission: RE | Admit: 2024-04-20 | Discharge: 2024-04-20 | Disposition: A | Discharge status: Home / Self Care | Source: Ambulatory Visit | Attending: Radiation Oncology | Admitting: Radiation Oncology

## 2024-04-20 DIAGNOSIS — C3412 Malignant neoplasm of upper lobe, left bronchus or lung: Secondary | ICD-10-CM | POA: Diagnosis not present

## 2024-04-20 LAB — RAD ONC ARIA SESSION SUMMARY
Course Elapsed Days: 9
Plan Fractions Treated to Date: 8
Plan Prescribed Dose Per Fraction: 2 Gy
Plan Total Fractions Prescribed: 30
Plan Total Prescribed Dose: 60 Gy
Reference Point Dosage Given to Date: 16 Gy
Reference Point Session Dosage Given: 2 Gy
Session Number: 8

## 2024-04-21 ENCOUNTER — Other Ambulatory Visit: Payer: Self-pay

## 2024-04-21 ENCOUNTER — Ambulatory Visit
Admission: RE | Admit: 2024-04-21 | Discharge: 2024-04-21 | Disposition: A | Discharge status: Home / Self Care | Source: Ambulatory Visit | Attending: Radiation Oncology | Admitting: Radiation Oncology

## 2024-04-21 DIAGNOSIS — C3412 Malignant neoplasm of upper lobe, left bronchus or lung: Secondary | ICD-10-CM | POA: Diagnosis not present

## 2024-04-21 LAB — RAD ONC ARIA SESSION SUMMARY
Course Elapsed Days: 10
Plan Fractions Treated to Date: 9
Plan Prescribed Dose Per Fraction: 2 Gy
Plan Total Fractions Prescribed: 30
Plan Total Prescribed Dose: 60 Gy
Reference Point Dosage Given to Date: 18 Gy
Reference Point Session Dosage Given: 2 Gy
Session Number: 9

## 2024-04-24 ENCOUNTER — Other Ambulatory Visit: Payer: Self-pay

## 2024-04-24 ENCOUNTER — Ambulatory Visit
Admission: RE | Admit: 2024-04-24 | Discharge: 2024-04-24 | Disposition: A | Discharge status: Home / Self Care | Source: Ambulatory Visit | Attending: Radiation Oncology | Admitting: Radiation Oncology

## 2024-04-24 DIAGNOSIS — C3412 Malignant neoplasm of upper lobe, left bronchus or lung: Secondary | ICD-10-CM | POA: Diagnosis not present

## 2024-04-24 LAB — RAD ONC ARIA SESSION SUMMARY
Course Elapsed Days: 13
Plan Fractions Treated to Date: 10
Plan Prescribed Dose Per Fraction: 2 Gy
Plan Total Fractions Prescribed: 30
Plan Total Prescribed Dose: 60 Gy
Reference Point Dosage Given to Date: 20 Gy
Reference Point Session Dosage Given: 2 Gy
Session Number: 10

## 2024-04-25 ENCOUNTER — Inpatient Hospital Stay

## 2024-04-25 ENCOUNTER — Other Ambulatory Visit: Payer: Self-pay

## 2024-04-25 ENCOUNTER — Ambulatory Visit

## 2024-04-25 ENCOUNTER — Ambulatory Visit
Admission: RE | Admit: 2024-04-25 | Discharge: 2024-04-25 | Disposition: A | Discharge status: Home / Self Care | Source: Ambulatory Visit | Attending: Radiation Oncology | Admitting: Radiation Oncology

## 2024-04-25 ENCOUNTER — Encounter: Admitting: Thoracic Surgery (Cardiothoracic Vascular Surgery)

## 2024-04-25 VITALS — BP 159/77 | HR 72 | Temp 97.8°F | Resp 17 | Wt 240.6 lb

## 2024-04-25 DIAGNOSIS — C3412 Malignant neoplasm of upper lobe, left bronchus or lung: Secondary | ICD-10-CM | POA: Diagnosis not present

## 2024-04-25 LAB — CBC WITH DIFFERENTIAL (CANCER CENTER ONLY)
Abs Immature Granulocytes: 0.01 10*3/uL (ref 0.00–0.07)
Basophils Absolute: 0 10*3/uL (ref 0.0–0.1)
Basophils Relative: 0 %
Eosinophils Absolute: 0 10*3/uL (ref 0.0–0.5)
Eosinophils Relative: 1 %
HCT: 37 % — ABNORMAL LOW (ref 39.0–52.0)
Hemoglobin: 13.2 g/dL (ref 13.0–17.0)
Immature Granulocytes: 0 %
Lymphocytes Relative: 28 %
Lymphs Abs: 1.2 10*3/uL (ref 0.7–4.0)
MCH: 32.5 pg (ref 26.0–34.0)
MCHC: 35.7 g/dL (ref 30.0–36.0)
MCV: 91.1 fL (ref 80.0–100.0)
Monocytes Absolute: 0.2 10*3/uL (ref 0.1–1.0)
Monocytes Relative: 4 %
Neutro Abs: 2.8 10*3/uL (ref 1.7–7.7)
Neutrophils Relative %: 67 %
Platelet Count: 172 10*3/uL (ref 150–400)
RBC: 4.06 MIL/uL — ABNORMAL LOW (ref 4.22–5.81)
RDW: 13.4 % (ref 11.5–15.5)
WBC Count: 4.3 10*3/uL (ref 4.0–10.5)
nRBC: 0 % (ref 0.0–0.2)

## 2024-04-25 LAB — RAD ONC ARIA SESSION SUMMARY
Course Elapsed Days: 14
Plan Fractions Treated to Date: 11
Plan Prescribed Dose Per Fraction: 2 Gy
Plan Total Fractions Prescribed: 30
Plan Total Prescribed Dose: 60 Gy
Reference Point Dosage Given to Date: 22 Gy
Reference Point Session Dosage Given: 2 Gy
Session Number: 11

## 2024-04-25 LAB — CMP (CANCER CENTER ONLY)
ALT: 28 U/L (ref 0–44)
AST: 24 U/L (ref 15–41)
Albumin: 4.4 g/dL (ref 3.5–5.0)
Alkaline Phosphatase: 49 U/L (ref 38–126)
Anion gap: 7 (ref 5–15)
BUN: 17 mg/dL (ref 8–23)
CO2: 24 mmol/L (ref 22–32)
Calcium: 9.3 mg/dL (ref 8.9–10.3)
Chloride: 107 mmol/L (ref 98–111)
Creatinine: 0.92 mg/dL (ref 0.61–1.24)
GFR, Estimated: >60 mL/min (ref >60)
Glucose, Bld: 118 mg/dL — ABNORMAL HIGH (ref 70–99)
Potassium: 4 mmol/L (ref 3.5–5.1)
Sodium: 138 mmol/L (ref 135–145)
Total Bilirubin: 0.5 mg/dL (ref 0.0–1.2)
Total Protein: 7.3 g/dL (ref 6.5–8.1)

## 2024-04-25 MED ORDER — SODIUM CHLORIDE 0.9 % IV SOLN: INTRAVENOUS | Status: DC

## 2024-04-25 MED ORDER — FAMOTIDINE IN NACL 20-0.9 MG/50ML-% IV SOLN
20.0000 mg | Freq: Once | INTRAVENOUS | Status: AC
  Administered 2024-04-25: 20 mg via INTRAVENOUS
  Filled 2024-04-25: qty 50

## 2024-04-25 MED ORDER — PALONOSETRON HCL INJECTION 0.25 MG/5ML
0.2500 mg | Freq: Once | INTRAVENOUS | Status: AC
  Administered 2024-04-25: 0.25 mg via INTRAVENOUS
  Filled 2024-04-25: qty 5

## 2024-04-25 MED ORDER — DEXAMETHASONE SODIUM PHOSPHATE 10 MG/ML IJ SOLN
10.0000 mg | Freq: Once | INTRAMUSCULAR | Status: AC
  Administered 2024-04-25: 10 mg via INTRAVENOUS
  Filled 2024-04-25: qty 1

## 2024-04-25 MED ORDER — SODIUM CHLORIDE 0.9 % IV SOLN
239.8000 mg | Freq: Once | INTRAVENOUS | Status: AC
  Administered 2024-04-25: 239.8 mg via INTRAVENOUS
  Filled 2024-04-25: qty 23.98

## 2024-04-25 MED ORDER — PACLITAXEL PROTEIN-BOUND CHEMO INJECTION 100 MG
45.0000 mg/m2 | Freq: Once | INTRAVENOUS | Status: AC
  Administered 2024-04-25: 100 mg via INTRAVENOUS
  Filled 2024-04-25: qty 20

## 2024-04-26 ENCOUNTER — Other Ambulatory Visit: Payer: Self-pay

## 2024-04-26 ENCOUNTER — Ambulatory Visit
Admission: RE | Admit: 2024-04-26 | Discharge: 2024-04-26 | Disposition: A | Discharge status: Home / Self Care | Source: Ambulatory Visit | Attending: Radiation Oncology | Admitting: Radiation Oncology

## 2024-04-26 DIAGNOSIS — C3412 Malignant neoplasm of upper lobe, left bronchus or lung: Secondary | ICD-10-CM | POA: Diagnosis not present

## 2024-04-26 LAB — RAD ONC ARIA SESSION SUMMARY
Course Elapsed Days: 15
Plan Fractions Treated to Date: 12
Plan Prescribed Dose Per Fraction: 2 Gy
Plan Total Fractions Prescribed: 30
Plan Total Prescribed Dose: 60 Gy
Reference Point Dosage Given to Date: 24 Gy
Reference Point Session Dosage Given: 2 Gy
Session Number: 12

## 2024-04-27 ENCOUNTER — Ambulatory Visit
Admission: RE | Admit: 2024-04-27 | Discharge: 2024-04-27 | Disposition: A | Discharge status: Home / Self Care | Source: Ambulatory Visit | Attending: Radiation Oncology | Admitting: Radiation Oncology

## 2024-04-27 ENCOUNTER — Other Ambulatory Visit: Payer: Self-pay

## 2024-04-27 DIAGNOSIS — C3412 Malignant neoplasm of upper lobe, left bronchus or lung: Secondary | ICD-10-CM | POA: Diagnosis not present

## 2024-04-27 LAB — RAD ONC ARIA SESSION SUMMARY
Course Elapsed Days: 16
Plan Fractions Treated to Date: 13
Plan Prescribed Dose Per Fraction: 2 Gy
Plan Total Fractions Prescribed: 30
Plan Total Prescribed Dose: 60 Gy
Reference Point Dosage Given to Date: 26 Gy
Reference Point Session Dosage Given: 2 Gy
Session Number: 13

## 2024-04-28 ENCOUNTER — Ambulatory Visit
Admission: RE | Admit: 2024-04-28 | Discharge: 2024-04-28 | Disposition: A | Discharge status: Home / Self Care | Source: Ambulatory Visit | Attending: Radiation Oncology | Admitting: Radiation Oncology

## 2024-04-28 ENCOUNTER — Other Ambulatory Visit: Payer: Self-pay

## 2024-04-28 DIAGNOSIS — C3412 Malignant neoplasm of upper lobe, left bronchus or lung: Secondary | ICD-10-CM | POA: Diagnosis not present

## 2024-04-28 LAB — RAD ONC ARIA SESSION SUMMARY
Course Elapsed Days: 17
Plan Fractions Treated to Date: 14
Plan Prescribed Dose Per Fraction: 2 Gy
Plan Total Fractions Prescribed: 30
Plan Total Prescribed Dose: 60 Gy
Reference Point Dosage Given to Date: 28 Gy
Reference Point Session Dosage Given: 2 Gy
Session Number: 14

## 2024-05-01 ENCOUNTER — Other Ambulatory Visit: Payer: Self-pay

## 2024-05-01 ENCOUNTER — Ambulatory Visit
Admission: RE | Admit: 2024-05-01 | Discharge: 2024-05-01 | Disposition: A | Discharge status: Home / Self Care | Source: Ambulatory Visit | Attending: Radiation Oncology | Admitting: Radiation Oncology

## 2024-05-01 DIAGNOSIS — C3412 Malignant neoplasm of upper lobe, left bronchus or lung: Secondary | ICD-10-CM | POA: Diagnosis not present

## 2024-05-01 LAB — RAD ONC ARIA SESSION SUMMARY
Course Elapsed Days: 20
Plan Fractions Treated to Date: 15
Plan Prescribed Dose Per Fraction: 2 Gy
Plan Total Fractions Prescribed: 30
Plan Total Prescribed Dose: 60 Gy
Reference Point Dosage Given to Date: 30 Gy
Reference Point Session Dosage Given: 2 Gy
Session Number: 15

## 2024-05-02 ENCOUNTER — Inpatient Hospital Stay

## 2024-05-02 ENCOUNTER — Other Ambulatory Visit: Payer: Self-pay

## 2024-05-02 ENCOUNTER — Ambulatory Visit
Admission: RE | Admit: 2024-05-02 | Discharge: 2024-05-02 | Disposition: A | Discharge status: Home / Self Care | Source: Ambulatory Visit | Attending: Radiation Oncology | Admitting: Radiation Oncology

## 2024-05-02 ENCOUNTER — Inpatient Hospital Stay (HOSPITAL_BASED_OUTPATIENT_CLINIC_OR_DEPARTMENT_OTHER): Admitting: Internal Medicine

## 2024-05-02 VITALS — BP 123/73 | HR 87 | Temp 98.2°F | Resp 17 | Ht 75.0 in | Wt 232.9 lb

## 2024-05-02 DIAGNOSIS — C3400 Malignant neoplasm of unspecified main bronchus: Secondary | ICD-10-CM | POA: Diagnosis not present

## 2024-05-02 DIAGNOSIS — C3412 Malignant neoplasm of upper lobe, left bronchus or lung: Secondary | ICD-10-CM

## 2024-05-02 LAB — CMP (CANCER CENTER ONLY)
ALT: 22 U/L (ref 0–44)
AST: 19 U/L (ref 15–41)
Albumin: 4.6 g/dL (ref 3.5–5.0)
Alkaline Phosphatase: 49 U/L (ref 38–126)
Anion gap: 9 (ref 5–15)
BUN: 19 mg/dL (ref 8–23)
CO2: 24 mmol/L (ref 22–32)
Calcium: 9.7 mg/dL (ref 8.9–10.3)
Chloride: 107 mmol/L (ref 98–111)
Creatinine: 0.97 mg/dL (ref 0.61–1.24)
GFR, Estimated: >60 mL/min (ref >60)
Glucose, Bld: 99 mg/dL (ref 70–99)
Potassium: 3.7 mmol/L (ref 3.5–5.1)
Sodium: 140 mmol/L (ref 135–145)
Total Bilirubin: 0.8 mg/dL (ref 0.0–1.2)
Total Protein: 7.8 g/dL (ref 6.5–8.1)

## 2024-05-02 LAB — CBC WITH DIFFERENTIAL (CANCER CENTER ONLY)
Abs Immature Granulocytes: 0.01 10*3/uL (ref 0.00–0.07)
Basophils Absolute: 0 10*3/uL (ref 0.0–0.1)
Basophils Relative: 0 %
Eosinophils Absolute: 0 10*3/uL (ref 0.0–0.5)
Eosinophils Relative: 1 %
HCT: 38.9 % — ABNORMAL LOW (ref 39.0–52.0)
Hemoglobin: 13.6 g/dL (ref 13.0–17.0)
Immature Granulocytes: 0 %
Lymphocytes Relative: 26 %
Lymphs Abs: 1 10*3/uL (ref 0.7–4.0)
MCH: 32.2 pg (ref 26.0–34.0)
MCHC: 35 g/dL (ref 30.0–36.0)
MCV: 92 fL (ref 80.0–100.0)
Monocytes Absolute: 0.3 10*3/uL (ref 0.1–1.0)
Monocytes Relative: 9 %
Neutro Abs: 2.4 10*3/uL (ref 1.7–7.7)
Neutrophils Relative %: 64 %
Platelet Count: 163 10*3/uL (ref 150–400)
RBC: 4.23 MIL/uL (ref 4.22–5.81)
RDW: 13.4 % (ref 11.5–15.5)
WBC Count: 3.8 10*3/uL — ABNORMAL LOW (ref 4.0–10.5)
nRBC: 0 % (ref 0.0–0.2)

## 2024-05-02 LAB — RAD ONC ARIA SESSION SUMMARY
Course Elapsed Days: 21
Plan Fractions Treated to Date: 16
Plan Prescribed Dose Per Fraction: 2 Gy
Plan Total Fractions Prescribed: 30
Plan Total Prescribed Dose: 60 Gy
Reference Point Dosage Given to Date: 32 Gy
Reference Point Session Dosage Given: 2 Gy
Session Number: 16

## 2024-05-02 MED ORDER — SODIUM CHLORIDE 0.9 % IV SOLN
239.8000 mg | Freq: Once | INTRAVENOUS | Status: AC
  Administered 2024-05-02: 239.8 mg via INTRAVENOUS
  Filled 2024-05-02: qty 23.98

## 2024-05-02 MED ORDER — SODIUM CHLORIDE 0.9 % IV SOLN: INTRAVENOUS | Status: DC

## 2024-05-02 MED ORDER — DEXAMETHASONE SODIUM PHOSPHATE 10 MG/ML IJ SOLN
10.0000 mg | Freq: Once | INTRAMUSCULAR | Status: AC
  Administered 2024-05-02: 10 mg via INTRAVENOUS
  Filled 2024-05-02: qty 1

## 2024-05-02 MED ORDER — PALONOSETRON HCL INJECTION 0.25 MG/5ML
0.2500 mg | Freq: Once | INTRAVENOUS | Status: AC
  Administered 2024-05-02: 0.25 mg via INTRAVENOUS
  Filled 2024-05-02: qty 5

## 2024-05-02 MED ORDER — FAMOTIDINE IN NACL 20-0.9 MG/50ML-% IV SOLN
20.0000 mg | Freq: Once | INTRAVENOUS | Status: AC
  Administered 2024-05-02: 20 mg via INTRAVENOUS
  Filled 2024-05-02: qty 50

## 2024-05-02 MED ORDER — PACLITAXEL PROTEIN-BOUND CHEMO INJECTION 100 MG
45.0000 mg/m2 | Freq: Once | INTRAVENOUS | Status: AC
  Administered 2024-05-02: 100 mg via INTRAVENOUS
  Filled 2024-05-02: qty 20

## 2024-05-02 NOTE — Patient Instructions (Signed)
 [facility] - A DEPT OF [facility]  Discharge Instructions: Thank you for choosing [facility] to provide your oncology and hematology care.   If you have a lab appointment with the Cancer Center, please go directly to the Cancer Center and check in at the registration area.   Wear comfortable clothing and clothing appropriate for easy access to any Portacath or PICC line.   We strive to give you quality time with your provider. You may need to reschedule your appointment if you arrive late (15 or more minutes).  Arriving late affects you and other patients whose appointments are after yours.  Also, if you miss three or more appointments without notifying the office, you may be dismissed from the clinic at the provider's discretion.      For prescription refill requests, have your pharmacy contact our office and allow 72 hours for refills to be completed.    Today you received the following chemotherapy and/or immunotherapy agents: Abraxane, Carboplatin      To help prevent nausea and vomiting after your treatment, we encourage you to take your nausea medication as directed.  BELOW ARE SYMPTOMS THAT SHOULD BE REPORTED IMMEDIATELY: *FEVER GREATER THAN 100.4 F (38 C) OR HIGHER *CHILLS OR SWEATING *NAUSEA AND VOMITING THAT IS NOT CONTROLLED WITH YOUR NAUSEA MEDICATION *UNUSUAL SHORTNESS OF BREATH *UNUSUAL BRUISING OR BLEEDING *URINARY PROBLEMS (pain or burning when urinating, or frequent urination) *BOWEL PROBLEMS (unusual diarrhea, constipation, pain near the anus) TENDERNESS IN MOUTH AND THROAT WITH OR WITHOUT PRESENCE OF ULCERS (sore throat, sores in mouth, or a toothache) UNUSUAL RASH, SWELLING OR PAIN  UNUSUAL VAGINAL DISCHARGE OR ITCHING   Items with * indicate a potential emergency and should be followed up as soon as possible or go to the Emergency Department if any problems should occur.  Please show the CHEMOTHERAPY ALERT CARD or  IMMUNOTHERAPY ALERT CARD at check-in to the Emergency Department and triage nurse.  Should you have questions after your visit or need to cancel or reschedule your appointment, please contact [facility] - A DEPT OF [facility]  Dept: [phone]  and follow the prompts.  Office hours are 8:00 a.m. to 4:30 p.m. Monday - Friday. Please note that voicemails left after 4:00 p.m. may not be returned until the following business day.  We are closed weekends and major holidays. You have access to a nurse at all times for urgent questions. Please call the main number to the clinic Dept: [phone] and follow the prompts.   For any non-urgent questions, you may also contact your provider using MyChart. We now offer e-Visits for anyone 33 and older to request care online for non-urgent symptoms. For details visit [email or web address].   Also download the MyChart app! Go to the app store, search "MyChart", open the app, select [place], and log in with your MyChart username and password.

## 2024-05-02 NOTE — Progress Notes (Signed)
 [facility] [phone]   Fax:([phone]  OFFICE PROGRESS NOTE  Jackson Arnold, PA-C [address] [place] [address]  DIAGNOSIS: Stage IIIA (T3, N2, M0) non-small cell lung cancer, favoring adenocarcinoma presented with left hilar soft tissue mass in addition to left hilar and mediastinal lymphadenopathy diagnosed in January 2025.   PRIOR THERAPY: Neoadjuvant systemic chemotherapy with carboplatin  for AUC of 5, Alimta 500 Mg/M2 and nivolumab  360 Mg IV every 3 weeks.  First dose December 30, 2023.  Status post 3 cycles.  CURRENT THERAPY: Concurrent chemoradiation with weekly carboplatin  for AUC of 2 and Abraxane  45 Mg/M2.  Status post 3 cycles.  INTERVAL HISTORY: Jackson Powers 78 y.o. male returns to the clinic today for follow-up visit.Discussed the use of AI scribe software for clinical note transcription with the patient, who gave verbal consent to proceed.  History of Present Illness   Jackson Powers is a 78 year old male with stage IIIA non-small cell lung cancer, adenocarcinoma, who presents for evaluation before starting cycle number four of concurrent chemoradiation.  He was diagnosed with stage IIIA non-small cell lung cancer, adenocarcinoma, in January 2025. He has completed neoadjuvant chemoimmunotherapy with carboplatin , Alimta, and nivolumab  for three cycles but was not a surgical candidate for resection. He is currently undergoing concurrent chemoradiation with carboplatin  and Abraxane , having completed three cycles.  He experiences significant esophageal pain, which he attributes to the radiation treatment. The pain is constant and he has difficulty swallowing. He has been using Carafit to manage some of the symptoms.  He has lost eight pounds over six days, which he attributes to the treatment and associated symptoms. He has experienced some diarrhea. No nausea or vomiting.       MEDICAL HISTORY: Past Medical History:  Diagnosis Date    Acute gastritis without hemorrhage 2007   Arthritis    osteoarthritis    Benign neoplasm of colon 2007   COPD (chronic obstructive pulmonary disease) (HCC)    Esophagitis 2007   Hypertension    Orthopnea    reports  i have trouble breathing when i lie completely flat. i have to use 2 people to prop my head up    Panic anxiety syndrome    patient reports having severe panic anxiety and claustraphobia. states with ear surgery years ago , they had to tie him down which caused him to panic even more;    Right BBB/left ant fasc block    (OLD)   TIA (transient ischemic attack)     ALLERGIES:  is allergic to fosaprepitant  and paclitaxel .  MEDICATIONS:  Current Outpatient Medications  Medication Sig Dispense Refill   amLODipine  (NORVASC ) 10 MG tablet Take 10 mg by mouth daily.     fluticasone  (FLONASE ) 50 MCG/ACT nasal spray SPRAY 1 SPRAY INTO BOTH NOSTRILS DAILY. 16 mL 2   Fluticasone -Umeclidin-Vilant (TRELEGY ELLIPTA ) 100-62.5-25 MCG/ACT AEPB TAKE 1 PUFF BY MOUTH EVERY DAY 60 each 3   Fluticasone -Umeclidin-Vilant (TRELEGY ELLIPTA ) 100-62.5-25 MCG/ACT AEPB 2 samples     metoprolol succinate (TOPROL-XL) 25 MG 24 hr tablet Take 25 mg by mouth daily.     prochlorperazine  (COMPAZINE ) 10 MG tablet Take 1 tablet (10 mg total) by mouth every 6 (six) hours as needed for nausea or vomiting. 30 tablet 0   sucralfate  (CARAFATE ) 1 g tablet DISSOLVE 1 TABLET IN 10 ML WATER AND SWALLOW 30 MIN PRIOR TO MEALS AND BEDTIME. 90 tablet 0   No current facility-administered medications for this  visit.   Facility-Administered Medications Ordered in Other Visits  Medication Dose Route Frequency Provider Last Rate Last Admin   sodium chloride  flush (NS) 0.9 % injection 3 mL  3 mL Intravenous Q12H Cherrie Cornwall, MD        SURGICAL HISTORY:  Past Surgical History:  Procedure Laterality Date   BRONCHIAL BIOPSY  12/02/2023   Procedure: BRONCHIAL BIOPSIES;  Surgeon: Vergia Glasgow, MD;  Location: [facility]  ENDOSCOPY;  Service: Pulmonary;;   BRONCHIAL BRUSHINGS  12/02/2023   Procedure: BRONCHIAL BRUSHINGS;  Surgeon: Vergia Glasgow, MD;  Location: [facility];  Service: Pulmonary;;   BRONCHIAL NEEDLE ASPIRATION BIOPSY  12/02/2023   Procedure: BRONCHIAL NEEDLE ASPIRATION BIOPSIES;  Surgeon: Vergia Glasgow, MD;  Location: [facility];  Service: Pulmonary;;   BRONCHIAL WASHINGS  12/02/2023   Procedure: BRONCHIAL WASHINGS;  Surgeon: Vergia Glasgow, MD;  Location: [facility];  Service: Pulmonary;;   LEFT HEART CATH AND CORONARY ANGIOGRAPHY Left 10/01/2020   Procedure: LEFT HEART CATH AND CORONARY ANGIOGRAPHY;  Surgeon: Cherrie Cornwall, MD;  Location: [facility] INVASIVE CV LAB;  Service: Cardiovascular;  Laterality: Left;   SKIN CANCER EXCISION Right 2017   EAR; FAILED GRAFT FOLLWOING SURGERY    TOTAL HIP ARTHROPLASTY Right 07/20/2018   Procedure: RIGHT TOTAL HIP ARTHROPLASTY ANTERIOR APPROACH;  Surgeon: Liliane Rei, MD;  Location: [facility];  Service: Orthopedics;  Laterality: Right;   VIDEO BRONCHOSCOPY WITH ENDOBRONCHIAL ULTRASOUND  12/02/2023   Procedure: VIDEO BRONCHOSCOPY WITH ENDOBRONCHIAL ULTRASOUND;  Surgeon: Vergia Glasgow, MD;  Location: [facility];  Service: Pulmonary;;    REVIEW OF SYSTEMS:  A comprehensive review of systems was negative except for: Constitutional: positive for fatigue Gastrointestinal: positive for odynophagia   PHYSICAL EXAMINATION: General appearance: alert, cooperative, fatigued, and no distress Head: Normocephalic, without obvious abnormality, atraumatic Neck: no adenopathy, no JVD, supple, symmetrical, trachea midline, and thyroid  not enlarged, symmetric, no tenderness/mass/nodules Lymph nodes: Cervical, supraclavicular, and axillary nodes normal. Resp: clear to auscultation bilaterally Back: symmetric, no curvature. ROM normal. No CVA tenderness. Cardio: regular rate and rhythm, S1, S2 normal, no murmur, click, rub or gallop GI: soft, non-tender; bowel sounds normal;  no masses,  no organomegaly Extremities: extremities normal, atraumatic, no cyanosis or edema  ECOG PERFORMANCE STATUS: 1 - Symptomatic but completely ambulatory  Blood pressure 123/73, pulse 87, temperature 98.2 F (36.8 C), temperature source Oral, resp. rate 17, height 6' 3 (1.905 m), weight 232 lb 14.4 oz (105.6 kg), SpO2 97%.  LABORATORY DATA: Lab Results  Component Value Date   WBC 3.8 (L) 05/02/2024   HGB 13.6 05/02/2024   HCT 38.9 (L) 05/02/2024   MCV 92.0 05/02/2024   PLT 163 05/02/2024      Chemistry      Component Value Date/Time   NA 138 04/25/2024 1156   K 4.0 04/25/2024 1156   CL 107 04/25/2024 1156   CO2 24 04/25/2024 1156   BUN 17 04/25/2024 1156   CREATININE 0.92 04/25/2024 1156      Component Value Date/Time   CALCIUM  9.3 04/25/2024 1156   ALKPHOS 49 04/25/2024 1156   AST 24 04/25/2024 1156   ALT 28 04/25/2024 1156   BILITOT 0.5 04/25/2024 1156       RADIOGRAPHIC STUDIES: No results found.    ASSESSMENT AND PLAN: This is a very pleasant 78 years old white male with Stage IIIA (T3, N2, M0) non-small cell lung cancer, favoring adenocarcinoma presented with left hilar soft tissue mass in addition to left hilar and mediastinal lymphadenopathy diagnosed in  January 2025.  He underwent neoadjuvant systemic chemotherapy with carboplatin  for AUC of 5, Alimta 500 Mg/M2 and nivolumab  360 Mg IV every 3 weeks status post 3 cycles.  He has been tolerating this treatment fairly well. The patient was not a good surgical candidate for resection.  He is here for evaluation and discussion of his treatment options. He is undergoing a course of concurrent chemoradiation with weekly carboplatin  and Abraxane  status post 3 cycles and he is here today for cycle #4. Assessment and Plan    Stage 3A non-small cell lung cancer, adenocarcinoma Diagnosed in January 2025, currently undergoing concurrent chemoradiation with carboplatin  and Abraxane , post three cycles. Blood work  remains adequate for treatment continuation. Experiencing significant pain and dysphagia due to radiation-induced esophagitis, impacting nutritional intake and contributing to weight loss. - Administer fourth cycle of concurrent chemoradiation with carboplatin  and Abraxane . - Schedule follow-up scan at the end of July to assess treatment efficacy.  Radiation-induced esophagitis Causing persistent pain and dysphagia, attributed to ongoing radiation therapy. Current management includes Carafit for symptomatic relief. - Coordinate with radiation oncology to adjust radiation parameters if necessary.  Weight loss Reported weight loss of eight pounds over six days, potentially related to treatment side effects and decreased oral intake due to esophagitis. - Encourage increased caloric intake through snacks and nutritional supplements.   The patient was advised to call immediately if he has any concerning symptoms in the interval. The patient voices understanding of current disease status and treatment options and is in agreement with the current care plan.  All questions were answered. The patient knows to call the clinic with any problems, questions or concerns. We can certainly see the patient much sooner if necessary.  The total time spent in the appointment was 20 minutes.  Disclaimer: This note was dictated with voice recognition software. Similar sounding words can inadvertently be transcribed and may not be corrected upon review.

## 2024-05-03 ENCOUNTER — Ambulatory Visit
Admission: RE | Admit: 2024-05-03 | Discharge: 2024-05-03 | Disposition: A | Discharge status: Home / Self Care | Source: Ambulatory Visit | Attending: Radiation Oncology | Admitting: Radiation Oncology

## 2024-05-03 ENCOUNTER — Other Ambulatory Visit: Payer: Self-pay

## 2024-05-03 DIAGNOSIS — C3412 Malignant neoplasm of upper lobe, left bronchus or lung: Secondary | ICD-10-CM | POA: Diagnosis not present

## 2024-05-03 LAB — RAD ONC ARIA SESSION SUMMARY
Course Elapsed Days: 22
Plan Fractions Treated to Date: 17
Plan Prescribed Dose Per Fraction: 2 Gy
Plan Total Fractions Prescribed: 30
Plan Total Prescribed Dose: 60 Gy
Reference Point Dosage Given to Date: 34 Gy
Reference Point Session Dosage Given: 2 Gy
Session Number: 17

## 2024-05-04 ENCOUNTER — Other Ambulatory Visit: Payer: Self-pay

## 2024-05-04 ENCOUNTER — Ambulatory Visit
Admission: RE | Admit: 2024-05-04 | Discharge: 2024-05-04 | Disposition: A | Discharge status: Home / Self Care | Source: Ambulatory Visit | Attending: Radiation Oncology | Admitting: Radiation Oncology

## 2024-05-04 DIAGNOSIS — C3412 Malignant neoplasm of upper lobe, left bronchus or lung: Secondary | ICD-10-CM | POA: Diagnosis not present

## 2024-05-04 LAB — RAD ONC ARIA SESSION SUMMARY
Course Elapsed Days: 23
Plan Fractions Treated to Date: 18
Plan Prescribed Dose Per Fraction: 2 Gy
Plan Total Fractions Prescribed: 30
Plan Total Prescribed Dose: 60 Gy
Reference Point Dosage Given to Date: 36 Gy
Reference Point Session Dosage Given: 2 Gy
Session Number: 18

## 2024-05-05 ENCOUNTER — Other Ambulatory Visit: Payer: Self-pay | Admitting: Radiation Oncology

## 2024-05-05 ENCOUNTER — Ambulatory Visit
Admission: RE | Admit: 2024-05-05 | Discharge: 2024-05-05 | Disposition: A | Discharge status: Home / Self Care | Source: Ambulatory Visit | Attending: Radiation Oncology | Admitting: Radiation Oncology

## 2024-05-05 ENCOUNTER — Other Ambulatory Visit: Payer: Self-pay

## 2024-05-05 DIAGNOSIS — C3412 Malignant neoplasm of upper lobe, left bronchus or lung: Secondary | ICD-10-CM | POA: Diagnosis not present

## 2024-05-05 LAB — RAD ONC ARIA SESSION SUMMARY
Course Elapsed Days: 24
Plan Fractions Treated to Date: 19
Plan Prescribed Dose Per Fraction: 2 Gy
Plan Total Fractions Prescribed: 30
Plan Total Prescribed Dose: 60 Gy
Reference Point Dosage Given to Date: 38 Gy
Reference Point Session Dosage Given: 2 Gy
Session Number: 19

## 2024-05-05 MED ORDER — LIDOCAINE VISCOUS HCL 2 % MT SOLN: 15.0000 mL | OROMUCOSAL | 0 refills | Status: DC | PRN

## 2024-05-06 ENCOUNTER — Other Ambulatory Visit: Payer: Self-pay

## 2024-05-08 ENCOUNTER — Ambulatory Visit

## 2024-05-08 ENCOUNTER — Other Ambulatory Visit: Payer: Self-pay

## 2024-05-08 ENCOUNTER — Telehealth: Payer: Self-pay

## 2024-05-08 ENCOUNTER — Emergency Department (HOSPITAL_COMMUNITY)

## 2024-05-08 ENCOUNTER — Encounter (HOSPITAL_COMMUNITY): Payer: Self-pay | Admitting: Emergency Medicine

## 2024-05-08 ENCOUNTER — Encounter: Payer: Self-pay | Admitting: Physician Assistant

## 2024-05-08 ENCOUNTER — Inpatient Hospital Stay (HOSPITAL_COMMUNITY)
Admission: EM | Admit: 2024-05-08 | Discharge: 2024-05-19 | DRG: 392 | Disposition: A | Discharge status: Home / Self Care | Attending: Family Medicine | Admitting: Family Medicine

## 2024-05-08 DIAGNOSIS — E440 Moderate protein-calorie malnutrition: Secondary | ICD-10-CM | POA: Diagnosis present

## 2024-05-08 DIAGNOSIS — Z8673 Personal history of transient ischemic attack (TIA), and cerebral infarction without residual deficits: Secondary | ICD-10-CM

## 2024-05-08 DIAGNOSIS — R1310 Dysphagia, unspecified: Secondary | ICD-10-CM | POA: Diagnosis present

## 2024-05-08 DIAGNOSIS — T66XXXA Radiation sickness, unspecified, initial encounter: Secondary | ICD-10-CM | POA: Diagnosis not present

## 2024-05-08 DIAGNOSIS — I4510 Unspecified right bundle-branch block: Secondary | ICD-10-CM | POA: Diagnosis present

## 2024-05-08 DIAGNOSIS — Z79899 Other long term (current) drug therapy: Secondary | ICD-10-CM

## 2024-05-08 DIAGNOSIS — I10 Essential (primary) hypertension: Secondary | ICD-10-CM | POA: Diagnosis present

## 2024-05-08 DIAGNOSIS — J449 Chronic obstructive pulmonary disease, unspecified: Secondary | ICD-10-CM | POA: Diagnosis present

## 2024-05-08 DIAGNOSIS — Z87891 Personal history of nicotine dependence: Secondary | ICD-10-CM

## 2024-05-08 DIAGNOSIS — D630 Anemia in neoplastic disease: Secondary | ICD-10-CM | POA: Diagnosis present

## 2024-05-08 DIAGNOSIS — Z96641 Presence of right artificial hip joint: Secondary | ICD-10-CM | POA: Diagnosis present

## 2024-05-08 DIAGNOSIS — Z85828 Personal history of other malignant neoplasm of skin: Secondary | ICD-10-CM

## 2024-05-08 DIAGNOSIS — E860 Dehydration: Secondary | ICD-10-CM | POA: Diagnosis present

## 2024-05-08 DIAGNOSIS — Y842 Radiological procedure and radiotherapy as the cause of abnormal reaction of the patient, or of later complication, without mention of misadventure at the time of the procedure: Secondary | ICD-10-CM | POA: Diagnosis present

## 2024-05-08 DIAGNOSIS — K208 Other esophagitis without bleeding: Secondary | ICD-10-CM | POA: Diagnosis not present

## 2024-05-08 DIAGNOSIS — Z6827 Body mass index (BMI) 27.0-27.9, adult: Secondary | ICD-10-CM

## 2024-05-08 DIAGNOSIS — R079 Chest pain, unspecified: Secondary | ICD-10-CM | POA: Diagnosis present

## 2024-05-08 DIAGNOSIS — C3490 Malignant neoplasm of unspecified part of unspecified bronchus or lung: Secondary | ICD-10-CM | POA: Diagnosis present

## 2024-05-08 DIAGNOSIS — Z860100 Personal history of colon polyps, unspecified: Secondary | ICD-10-CM

## 2024-05-08 DIAGNOSIS — Z888 Allergy status to other drugs, medicaments and biological substances status: Secondary | ICD-10-CM

## 2024-05-08 DIAGNOSIS — I2510 Atherosclerotic heart disease of native coronary artery without angina pectoris: Secondary | ICD-10-CM | POA: Diagnosis present

## 2024-05-08 LAB — CBC WITH DIFFERENTIAL/PLATELET
Abs Immature Granulocytes: 0.02 10*3/uL (ref 0.00–0.07)
Basophils Absolute: 0 10*3/uL (ref 0.0–0.1)
Basophils Relative: 1 %
Eosinophils Absolute: 0 10*3/uL (ref 0.0–0.5)
Eosinophils Relative: 0 %
HCT: 41.9 % (ref 39.0–52.0)
Hemoglobin: 14.2 g/dL (ref 13.0–17.0)
Immature Granulocytes: 1 %
Lymphocytes Relative: 12 %
Lymphs Abs: 0.5 10*3/uL — ABNORMAL LOW (ref 0.7–4.0)
MCH: 31.8 pg (ref 26.0–34.0)
MCHC: 33.9 g/dL (ref 30.0–36.0)
MCV: 93.9 fL (ref 80.0–100.0)
Monocytes Absolute: 0.3 10*3/uL (ref 0.1–1.0)
Monocytes Relative: 7 %
Neutro Abs: 3.5 10*3/uL (ref 1.7–7.7)
Neutrophils Relative %: 79 %
Platelets: 189 10*3/uL (ref 150–400)
RBC: 4.46 MIL/uL (ref 4.22–5.81)
RDW: 13.5 % (ref 11.5–15.5)
WBC: 4.4 10*3/uL (ref 4.0–10.5)
nRBC: 0 % (ref 0.0–0.2)

## 2024-05-08 LAB — I-STAT CG4 LACTIC ACID, ED
Lactic Acid, Venous: 1.5 mmol/L (ref 0.5–1.9)
Lactic Acid, Venous: 1.8 mmol/L (ref 0.5–1.9)

## 2024-05-08 LAB — URINALYSIS, W/ REFLEX TO CULTURE (INFECTION SUSPECTED)
Bacteria, UA: NONE SEEN
Bilirubin Urine: NEGATIVE
Glucose, UA: NEGATIVE mg/dL
Hgb urine dipstick: NEGATIVE
Ketones, ur: 20 mg/dL — AB
Leukocytes,Ua: NEGATIVE
Nitrite: NEGATIVE
Protein, ur: NEGATIVE mg/dL
Specific Gravity, Urine: >1.046 — ABNORMAL HIGH (ref 1.005–1.030)
pH: 5 (ref 5.0–8.0)

## 2024-05-08 LAB — COMPREHENSIVE METABOLIC PANEL WITH GFR
ALT: 31 U/L (ref 0–44)
AST: 27 U/L (ref 15–41)
Albumin: 4.5 g/dL (ref 3.5–5.0)
Alkaline Phosphatase: 48 U/L (ref 38–126)
Anion gap: 14 (ref 5–15)
BUN: 33 mg/dL — ABNORMAL HIGH (ref 8–23)
CO2: 21 mmol/L — ABNORMAL LOW (ref 22–32)
Calcium: 9.7 mg/dL (ref 8.9–10.3)
Chloride: 103 mmol/L (ref 98–111)
Creatinine, Ser: 0.95 mg/dL (ref 0.61–1.24)
GFR, Estimated: >60 mL/min (ref >60)
Glucose, Bld: 126 mg/dL — ABNORMAL HIGH (ref 70–99)
Potassium: 3.6 mmol/L (ref 3.5–5.1)
Sodium: 138 mmol/L (ref 135–145)
Total Bilirubin: 0.9 mg/dL (ref 0.0–1.2)
Total Protein: 7.7 g/dL (ref 6.5–8.1)

## 2024-05-08 LAB — TROPONIN I (HIGH SENSITIVITY): Troponin I (High Sensitivity): 14 ng/L (ref <18)

## 2024-05-08 LAB — MAGNESIUM: Magnesium: 2.1 mg/dL (ref 1.7–2.4)

## 2024-05-08 MED ORDER — FENTANYL CITRATE PF 50 MCG/ML IJ SOSY
50.0000 ug | PREFILLED_SYRINGE | Freq: Once | INTRAMUSCULAR | Status: AC
  Administered 2024-05-08: 50 ug via INTRAVENOUS
  Filled 2024-05-08: qty 1

## 2024-05-08 MED ORDER — POLYETHYLENE GLYCOL 3350 17 G PO PACK: 17.0000 g | PACK | Freq: Every day | ORAL | Status: DC | PRN

## 2024-05-08 MED ORDER — IOHEXOL 300 MG/ML  SOLN
75.0000 mL | Freq: Once | INTRAMUSCULAR | Status: AC | PRN
  Administered 2024-05-08: 75 mL via INTRAVENOUS

## 2024-05-08 MED ORDER — PANTOPRAZOLE SODIUM 40 MG IV SOLR
40.0000 mg | Freq: Once | INTRAVENOUS | Status: AC
  Administered 2024-05-08: 40 mg via INTRAVENOUS
  Filled 2024-05-08: qty 10

## 2024-05-08 MED ORDER — LABETALOL HCL 5 MG/ML IV SOLN: 10.0000 mg | INTRAVENOUS | Status: DC | PRN

## 2024-05-08 MED ORDER — LIDOCAINE VISCOUS HCL 2 % MT SOLN
15.0000 mL | OROMUCOSAL | Status: DC | PRN
  Administered 2024-05-10 – 2024-05-14 (×5): 15 mL via OROMUCOSAL
  Filled 2024-05-08 (×5): qty 15

## 2024-05-08 MED ORDER — SODIUM CHLORIDE 0.9 % IV SOLN
12.5000 mg | Freq: Once | INTRAVENOUS | Status: AC
  Administered 2024-05-08: 12.5 mg via INTRAVENOUS
  Filled 2024-05-08: qty 12.5

## 2024-05-08 MED ORDER — SODIUM CHLORIDE 0.9% FLUSH
3.0000 mL | Freq: Two times a day (BID) | INTRAVENOUS | Status: DC
  Administered 2024-05-09 – 2024-05-19 (×18): 3 mL via INTRAVENOUS

## 2024-05-08 MED ORDER — ACETAMINOPHEN 325 MG PO TABS
650.0000 mg | ORAL_TABLET | Freq: Four times a day (QID) | ORAL | Status: DC | PRN
  Filled 2024-05-08: qty 2

## 2024-05-08 MED ORDER — LACTATED RINGERS IV SOLN: INTRAVENOUS | Status: DC

## 2024-05-08 MED ORDER — ACETAMINOPHEN 650 MG RE SUPP: 650.0000 mg | Freq: Four times a day (QID) | RECTAL | Status: DC | PRN

## 2024-05-08 MED ORDER — SODIUM CHLORIDE 0.9 % IV BOLUS
1000.0000 mL | Freq: Once | INTRAVENOUS | Status: AC
  Administered 2024-05-08: 1000 mL via INTRAVENOUS

## 2024-05-08 MED ORDER — MORPHINE SULFATE (PF) 2 MG/ML IV SOLN: 2.0000 mg | INTRAVENOUS | Status: DC | PRN

## 2024-05-08 MED ORDER — ASPIRIN 81 MG PO TBEC
81.0000 mg | DELAYED_RELEASE_TABLET | Freq: Every day | ORAL | Status: DC
  Administered 2024-05-10 – 2024-05-17 (×7): 81 mg via ORAL
  Filled 2024-05-08 (×10): qty 1

## 2024-05-08 MED ORDER — SUCRALFATE 1 GM/10ML PO SUSP
1.0000 g | Freq: Three times a day (TID) | ORAL | Status: DC
  Administered 2024-05-09 – 2024-05-19 (×36): 1 g via ORAL
  Filled 2024-05-08 (×39): qty 10

## 2024-05-08 MED ORDER — AMLODIPINE BESYLATE 5 MG PO TABS
10.0000 mg | ORAL_TABLET | Freq: Every day | ORAL | Status: DC
  Filled 2024-05-08 (×2): qty 2

## 2024-05-08 MED ORDER — ENOXAPARIN SODIUM 40 MG/0.4ML IJ SOSY
40.0000 mg | PREFILLED_SYRINGE | INTRAMUSCULAR | Status: DC
  Administered 2024-05-08 – 2024-05-18 (×11): 40 mg via SUBCUTANEOUS
  Filled 2024-05-08 (×11): qty 0.4

## 2024-05-08 MED ORDER — METOPROLOL SUCCINATE ER 50 MG PO TB24
25.0000 mg | ORAL_TABLET | Freq: Every day | ORAL | Status: DC
  Filled 2024-05-08 (×2): qty 1

## 2024-05-08 MED ORDER — NITROGLYCERIN 0.4 MG SL SUBL: 0.4000 mg | SUBLINGUAL_TABLET | SUBLINGUAL | Status: DC | PRN

## 2024-05-08 MED ORDER — BUDESON-GLYCOPYRROL-FORMOTEROL 160-9-4.8 MCG/ACT IN AERO
2.0000 | INHALATION_SPRAY | Freq: Two times a day (BID) | RESPIRATORY_TRACT | Status: DC
  Filled 2024-05-08 (×2): qty 5.9

## 2024-05-08 MED ORDER — FAMOTIDINE IN NACL 20-0.9 MG/50ML-% IV SOLN
20.0000 mg | Freq: Once | INTRAVENOUS | Status: AC
  Administered 2024-05-08: 20 mg via INTRAVENOUS
  Filled 2024-05-08: qty 50

## 2024-05-08 MED ORDER — PANTOPRAZOLE SODIUM 40 MG IV SOLR
40.0000 mg | Freq: Two times a day (BID) | INTRAVENOUS | Status: DC
  Administered 2024-05-09 – 2024-05-19 (×21): 40 mg via INTRAVENOUS
  Filled 2024-05-08 (×21): qty 10

## 2024-05-08 MED ORDER — METOCLOPRAMIDE HCL 5 MG/ML IJ SOLN
10.0000 mg | Freq: Three times a day (TID) | INTRAMUSCULAR | Status: DC | PRN
  Administered 2024-05-09 – 2024-05-17 (×2): 10 mg via INTRAVENOUS
  Filled 2024-05-08 (×2): qty 2

## 2024-05-08 NOTE — Telephone Encounter (Signed)
 Called from ED. Report patient presented with dysphagia. Unable to eat and drink much. Patient is on chemoradiation for stage IIIA lung cancer. Chart reviewed completed 19/30 fx. Discussed this can occur due to radiation esophagitis. Recommend IV PPI, IVF and carafate  on admission and other supportive measures as needed. Will let primary radiation oncologist and medical oncologist know.

## 2024-05-08 NOTE — Hospital Course (Signed)
 Jackson Powers is a 78 y.o. male with medical history significant for stage IIIa NSCLC on concurrent chemoradiation, COPD, HTN, RBBB who is admitted with radiation-induced esophagitis.

## 2024-05-08 NOTE — H&P (Signed)
 History and Physical    Jackson Powers FMW:[id number] DOB: 02-13-1946 DOA: 05/08/2024  PCP: Montey Lot, PA-C  Patient coming from: Home  I have personally briefly reviewed patient's old medical records in [facility]  Chief Complaint: Esophageal pain, poor oral intake  HPI: Jackson Powers is a 78 y.o. male with medical history significant for stage IIIa NSCLC on concurrent chemoradiation, COPD, HTN, history of CVA, RBBB who presented to the ED for evaluation of generalized weakness, esophageal pain, poor oral intake.  Patient reports developing esophageal pain since starting his radiation treatment.  He says he has not really had much oral intake since this past Father's Day, 8 days ago.  Pain is located behind his sternum.  He says he has not really even been able to drink water due to worsening pain.  He says over the last few days he has not been able to take any of his medications.  When seen in the ED, patient reports developing slightly different substernal chest pain since returning from CT scan.  This has been fairly persistent.  He is unsure if this is esophageal versus heart pain.  He did undergo a left heart cath November 2021 which showed nonobstructive CAD.  ED Course  Labs/Imaging on admission: I have personally reviewed following labs and imaging studies.  Initial vitals showed BP 105/91, pulse 112, RR 15, temp 98.6 F, SpO2 97% on room air.  Labs showed sodium 138, potassium 3.6, bicarb 21, BUN 33, creatinine 0.95, serum glucose 126, LFTs within normal limits, WBC 4.4, hemoglobin 14.2, platelets 189, lactic acid 1.8, magnesium 2.1.  CT chest without contrast showed stable left hilar/medial left upper lobe soft tissue/mass.  Stable right upper lobe pulmonary nodule.  Diffuse esophageal thickening most notable in the mid and lower esophagus concerning for esophagitis.  Diffuse coronary artery disease.  Patient was given 2 L normal saline, IV Pepcid  20 mg, IV Protonix 40  mg, IV fentanyl  50 mcg.  EDP spoke with oncology who recommended medical admission, IV PPI, IV fluids, Carafate  and their team will see in consultation.  The hospitalist service was consulted to admit.  Review of Systems: All systems reviewed and are negative except as documented in history of present illness above.   Past Medical History:  Diagnosis Date   Acute gastritis without hemorrhage 2007   Arthritis    osteoarthritis    Benign neoplasm of colon 2007   COPD (chronic obstructive pulmonary disease) (HCC)    Esophagitis 2007   Hypertension    Lung cancer (HCC) 12/14/2023   Orthopnea    reports  i have trouble breathing when i lie completely flat. i have to use 2 people to prop my head up    Panic anxiety syndrome    patient reports having severe panic anxiety and claustraphobia. states with ear surgery years ago , they had to tie him down which caused him to panic even more;    Right BBB/left ant fasc block    (OLD)   TIA (transient ischemic attack)     Past Surgical History:  Procedure Laterality Date   BRONCHIAL BIOPSY  12/02/2023   Procedure: BRONCHIAL BIOPSIES;  Surgeon: Isadora Hose, MD;  Location: [facility] ENDOSCOPY;  Service: Pulmonary;;   BRONCHIAL BRUSHINGS  12/02/2023   Procedure: BRONCHIAL BRUSHINGS;  Surgeon: Isadora Hose, MD;  Location: [facility] ENDOSCOPY;  Service: Pulmonary;;   BRONCHIAL NEEDLE ASPIRATION BIOPSY  12/02/2023   Procedure: BRONCHIAL NEEDLE ASPIRATION BIOPSIES;  Surgeon: Isadora Hose, MD;  Location[facility]  ENDOSCOPY;  Service: Pulmonary;;   BRONCHIAL WASHINGS  12/02/2023   Procedure: BRONCHIAL WASHINGS;  Surgeon: Isadora Hose, MD;  Location: [facility] ENDOSCOPY;  Service: Pulmonary;;   LEFT HEART CATH AND CORONARY ANGIOGRAPHY Left 10/01/2020   Procedure: LEFT HEART CATH AND CORONARY ANGIOGRAPHY;  Surgeon: Fernand Denyse LABOR, MD;  Location: [facility] INVASIVE CV LAB;  Service: Cardiovascular;  Laterality: Left;   SKIN CANCER EXCISION Right 2017   EAR; FAILED GRAFT  FOLLWOING SURGERY    TOTAL HIP ARTHROPLASTY Right 07/20/2018   Procedure: RIGHT TOTAL HIP ARTHROPLASTY ANTERIOR APPROACH;  Surgeon: Melodi Lerner, MD;  Location: [facility] ORS;  Service: Orthopedics;  Laterality: Right;   VIDEO BRONCHOSCOPY WITH ENDOBRONCHIAL ULTRASOUND  12/02/2023   Procedure: VIDEO BRONCHOSCOPY WITH ENDOBRONCHIAL ULTRASOUND;  Surgeon: Isadora Hose, MD;  Location: [facility] ENDOSCOPY;  Service: Pulmonary;;    Social History: Social History   Tobacco Use   Smoking status: Former    Current packs/day: 0.00    Average packs/day: 2.0 packs/day for 15.0 years (30.0 ttl pk-yrs)    Types: Cigarettes    Start date: 75    Quit date: 1989    Years since quitting: 36.4   Smokeless tobacco: Never  Vaping Use   Vaping status: Never Used  Substance Use Topics   Alcohol use: Not Currently   Drug use: Never     Allergies  Allergen Reactions   Fosaprepitant  Shortness Of Breath    SOB, back pain and mild flushing   Paclitaxel  Other (See Comments)    Severe back pain radiating down legs, bilateral (see progress note from 5/28)    History reviewed. No pertinent family history.   Prior to Admission medications   Medication Sig Start Date End Date Taking? Authorizing Provider  amLODipine  (NORVASC ) 10 MG tablet Take 10 mg by mouth daily.    [provider]  fluticasone  (FLONASE ) 50 MCG/ACT nasal spray SPRAY 1 SPRAY INTO BOTH NOSTRILS DAILY. 03/04/24   Kara Dorn NOVAK, MD  Fluticasone -Umeclidin-Vilant (TRELEGY ELLIPTA ) 100-62.5-25 MCG/ACT AEPB TAKE 1 PUFF BY MOUTH EVERY DAY 11/08/23   Kara Dorn NOVAK, MD  Fluticasone -Umeclidin-Vilant (TRELEGY ELLIPTA ) 100-62.5-25 MCG/ACT AEPB 2 samples 03/29/24   Kara Dorn NOVAK, MD  lidocaine  (XYLOCAINE ) 2 % solution Use as directed 15 mLs in the mouth or throat as needed. Swallow 20 min prior to meals and at bedtime 05/05/24   Shannon Agent, MD  metoprolol succinate (TOPROL-XL) 25 MG 24 hr tablet Take 25 mg by mouth daily.    [provider]  prochlorperazine  (COMPAZINE ) 10 MG tablet Take 1 tablet (10 mg total) by mouth every 6 (six) hours as needed for nausea or vomiting. 04/12/24   Sherrod Sherrod, MD  sucralfate  (CARAFATE ) 1 g tablet DISSOLVE 1 TABLET IN 10 ML WATER AND SWALLOW 30 MIN PRIOR TO MEALS AND BEDTIME. 04/19/24   Izell Domino, MD    Physical Exam: Vitals:   05/08/24 1517 05/08/24 1525 05/08/24 1900 05/08/24 1916  BP: (!) 105/91     Pulse: (!) 55  (!) 113   Resp: 15  18   Temp: 98.6 F (37 C)   98.2 F (36.8 C)  SpO2: 97%  97%   Weight:  105.6 kg    Height:  6' 3 (1.905 m)     Constitutional: Resting in bed, appears somewhat uncomfortable due to chest discomfort Eyes: EOMI, lids and conjunctivae normal ENMT: Mucous membranes are moist. Posterior pharynx clear of any exudate or lesions.Normal dentition.  Neck: normal, supple, no masses. Respiratory: clear to  auscultation bilaterally, no wheezing, no crackles. Normal respiratory effort. No accessory muscle use.  Cardiovascular: Regular rate and rhythm, no murmurs / rubs / gallops. No extremity edema. 2+ pedal pulses. Abdomen: no tenderness, no masses palpated. Musculoskeletal: no clubbing / cyanosis. No joint deformity upper and lower extremities. Good ROM, no contractures. Normal muscle tone.  Skin: no rashes, lesions, ulcers. No induration Neurologic: Sensation intact. Strength 5/5 in all 4.  Psychiatric: Normal judgment and insight. Alert and oriented x 3. Normal mood.   EKG: Personally reviewed. Sinus tachycardia, rate 112, PVC, RBBB.  Rate is faster when compared to previous.  Assessment/Plan Principal Problem:   Radiation-induced esophagitis Active Problems:   Chest pain   Lung cancer (HCC)   Essential hypertension   COPD (chronic obstructive pulmonary disease) (HCC)   Jackson Powers is a 78 y.o. male with medical history significant for stage IIIa NSCLC on concurrent chemoradiation, COPD, HTN, RBBB who is admitted with  radiation-induced esophagitis.  Assessment and Plan: Radiation-induced esophagitis: Patient unable to maintain any adequate oral intake for >1-week.  CT chest shows changes consistent with esophagitis. - IV Protonix 40 mg twice daily - Start Carafate  4 times daily - Viscous lidocaine  as needed - Continue IV fluid hydration overnight - Can try soft diet as tolerated - Oncology aware  Chest pain: Patient developed persistent central chest pain after returning from CT scan.  This feels slightly different than his presenting pain.  LHC November 2021 showed nonobstructive CAD.  CT chest shows diffuse coronary artery disease. - Check troponin level - Keep on telemetry - Nitroglycerin as needed - Resume home Toprol-XL 25 mg daily - Previously on aspirin , restart 81 mg daily  Stage IIIa non-small cell lung cancer: Follows with medical oncology Dr. Sherrod on concurrent chemoradiation.  CT chest shows stable left hilar/medial left upper lobe soft tissue/mass and stable right upper lobe pulmonary nodule.  COPD: Stable.  Continue Trelegy Ellipta .  Hypertension: Resume amlodipine  and Toprol-XL.  History of CVA: Restarted aspirin  as above.  Does not appear to be on statin at home.   DVT prophylaxis: enoxaparin  (LOVENOX ) injection 40 mg Start: 05/08/24 2200 Code Status: Full code, confirmed with patient on admission Family Communication: Spouse and daughter at bedside Disposition Plan: From home likely discharge to home pending clinical progress Consults called: Oncology Severity of Illness: The appropriate patient status for this patient is OBSERVATION. Observation status is judged to be reasonable and necessary in order to provide the required intensity of service to ensure the patient's safety. The patient's presenting symptoms, physical exam findings, and initial radiographic and laboratory data in the context of their medical condition is felt to place them at decreased risk for further  clinical deterioration. Furthermore, it is anticipated that the patient will be medically stable for discharge from the hospital within 2 midnights of admission.   Jorie Blanch MD [facility]  If 7PM-7AM, please contact night-coverage [email or web address]  05/08/2024, 7:36 PM

## 2024-05-08 NOTE — ED Provider Notes (Signed)
 [place] EMERGENCY DEPARTMENT AT [facility] Provider Note   CSN: [id number] Arrival date & time: 05/08/24  1512     Patient presents with: Weakness   Jackson Powers is a 78 y.o. male.   Pt is a 78 yo male with PMH of HTN, orthopnea, COPD, TIA, RBBB, and Stage IIIA (T3, N2, M0) non-small cell lung cancer presenting for generalized weakness with decreased PO intake for 6 days. Pt admits to slowly progressive symptoms since initiating radiation. States since [employer], 6 days ago, he has has only had a portion of a bottle of Pediasure. He admits to feelings of fatigue, generalized weakness, and dehydration. He denies nausea, vomiting, or abdominal pain. States decreased PO is secondary to difficulty swallowing and feeling like it is getting stuck in his chest.    PRIOR THERAPY: Neoadjuvant systemic chemotherapy with carboplatin  for AUC of 5, Alimta 500 Mg/M2 and nivolumab  360 Mg IV every 3 weeks.  First dose December 30, 2023.  Status post 3 cycles.   CURRENT THERAPY: Concurrent chemoradiation with weekly carboplatin  for AUC of 2 and Abraxane  45 Mg/M2.  Status post 3 cycles.    Weakness Associated symptoms: no abdominal pain, no arthralgias, no chest pain, no cough, no dysuria, no fever, no seizures, no shortness of breath and no vomiting        Prior to Admission medications   Medication Sig Start Date End Date Taking? Authorizing Provider  amLODipine  (NORVASC ) 10 MG tablet Take 10 mg by mouth daily.    [provider]  fluticasone  (FLONASE ) 50 MCG/ACT nasal spray SPRAY 1 SPRAY INTO BOTH NOSTRILS DAILY. 03/04/24   Kara Dorn NOVAK, MD  Fluticasone -Umeclidin-Vilant (TRELEGY ELLIPTA ) 100-62.5-25 MCG/ACT AEPB TAKE 1 PUFF BY MOUTH EVERY DAY 11/08/23   Kara Dorn NOVAK, MD  Fluticasone -Umeclidin-Vilant (TRELEGY ELLIPTA ) 100-62.5-25 MCG/ACT AEPB 2 samples 03/29/24   Kara Dorn NOVAK, MD  lidocaine  (XYLOCAINE ) 2 % solution Use as directed 15 mLs in the mouth or  throat as needed. Swallow 20 min prior to meals and at bedtime 05/05/24   Shannon Agent, MD  metoprolol succinate (TOPROL-XL) 25 MG 24 hr tablet Take 25 mg by mouth daily.    [provider]  prochlorperazine  (COMPAZINE ) 10 MG tablet Take 1 tablet (10 mg total) by mouth every 6 (six) hours as needed for nausea or vomiting. 04/12/24   Sherrod Sherrod, MD  sucralfate  (CARAFATE ) 1 g tablet DISSOLVE 1 TABLET IN 10 ML WATER AND SWALLOW 30 MIN PRIOR TO MEALS AND BEDTIME. 04/19/24   Izell Domino, MD    Allergies: Fosaprepitant  and Paclitaxel     Review of Systems  Constitutional:  Negative for chills and fever.  HENT:  Negative for ear pain and sore throat.   Eyes:  Negative for pain and visual disturbance.  Respiratory:  Negative for cough and shortness of breath.   Cardiovascular:  Negative for chest pain and palpitations.  Gastrointestinal:  Negative for abdominal pain and vomiting.  Genitourinary:  Negative for dysuria and hematuria.  Musculoskeletal:  Negative for arthralgias and back pain.  Skin:  Negative for color change and rash.  Neurological:  Positive for weakness. Negative for seizures and syncope.  All other systems reviewed and are negative.   Updated Vital Signs BP (!) 105/91   Pulse (!) 55   Temp 98.6 F (37 C)   Resp 15   Ht 6' 3 (1.905 m)   Wt 105.6 kg   SpO2 97%   BMI 29.10 kg/m   Physical Exam  Vitals and nursing note reviewed.  Constitutional:      General: He is not in acute distress.    Appearance: He is well-developed.  HENT:     Head: Normocephalic and atraumatic.     Mouth/Throat:     Mouth: Mucous membranes are dry.   Eyes:     Conjunctiva/sclera: Conjunctivae normal.    Cardiovascular:     Rate and Rhythm: Normal rate and regular rhythm.     Heart sounds: No murmur heard. Pulmonary:     Effort: Pulmonary effort is normal. No respiratory distress.     Breath sounds: Normal breath sounds.  Abdominal:     Palpations: Abdomen is soft.      Tenderness: There is no abdominal tenderness.   Musculoskeletal:        General: No swelling.     Cervical back: Neck supple.   Skin:    General: Skin is warm and dry.     Capillary Refill: Capillary refill takes 2 to 3 seconds.     Comments: Dry skin, skin tenting   Neurological:     Mental Status: He is alert.   Psychiatric:        Mood and Affect: Mood normal.     (all labs ordered are listed, but only abnormal results are displayed) Labs Reviewed  COMPREHENSIVE METABOLIC PANEL WITH GFR - Abnormal; Notable for the following components:      Result Value   CO2 21 (*)    Glucose, Bld 126 (*)    BUN 33 (*)    All other components within normal limits  CBC WITH DIFFERENTIAL/PLATELET  MAGNESIUM  URINALYSIS, W/ REFLEX TO CULTURE (INFECTION SUSPECTED)  I-STAT CG4 LACTIC ACID, ED  I-STAT CG4 LACTIC ACID, ED  TROPONIN I (HIGH SENSITIVITY)    EKG: EKG Interpretation Date/Time:  Monday May 08 2024 15:22:57 EDT Ventricular Rate:  112 PR Interval:  159 QRS Duration:  128 QT Interval:  345 QTC Calculation: 471 R Axis:   -79  Text Interpretation: Sinus tachycardia Ventricular premature complex Consider right atrial enlargement Right bundle branch block Inferior infarct, old Confirmed by Elnor Hila ([id number]) on 05/08/2024 4:06:37 PM  Radiology: CT Chest W Contrast Result Date: 05/08/2024 CLINICAL DATA:  difficulty swallowing, hx of lung cancer, likely post radiation, r/o mass effect EXAM: CT CHEST WITH CONTRAST TECHNIQUE: Multidetector CT imaging of the chest was performed during intravenous contrast administration. RADIATION DOSE REDUCTION: This exam was performed according to the departmental dose-optimization program which includes automated exposure control, adjustment of the mA and/or kV according to patient size and/or use of iterative reconstruction technique. CONTRAST:  75mL OMNIPAQUE  IOHEXOL  300 MG/ML  SOLN COMPARISON:  03/09/2024 FINDINGS: Cardiovascular: Heart is normal  size. Aorta is normal caliber. No filling defects in the pulmonary arteries to suggest pulmonary emboli. Diffuse coronary artery calcifications and scattered aortic atherosclerosis. Mediastinum/Nodes: No mediastinal adenopathy. Stable left hilar soft tissue without discrete measurable node. No right hilar or axillary adenopathy. Esophagus appears diffusely thickened, most notable in the mid and lower esophagus suggesting esophagitis. Small hiatal hernia. Trachea and thyroid  unremarkable. Lungs/Pleura: Left hilar/medial upper lobe soft tissue measures approximately 5.5 x 2.0 cm, not significantly changed since prior study. Well-circumscribed peripheral sub pleural nodule in the right upper lobe measures up to 11 mm compared to 12 mm previously. No new or enlarging pulmonary nodules. Linear scarring in the lingula and left lower lobe. Mild centrilobular emphysema. No pleural effusions. Upper Abdomen: No acute findings Musculoskeletal: Chest wall soft tissues are  unremarkable. No acute bony abnormality or focal osseous lesion. IMPRESSION: Stable left hilar/medial left upper lobe soft tissue/mass. Stable right upper lobe pulmonary nodule. Diffuse esophageal wall thickening, most notable in the mid and lower esophagus concerning for esophagitis. Diffuse coronary artery disease. Aortic Atherosclerosis (ICD10-I70.0) and Emphysema (ICD10-J43.9). Electronically Signed   By: Franky Crease M.D.   On: 05/08/2024 18:05   DG Chest Port 1 View Result Date: 05/08/2024 CLINICAL DATA:  Weakness and chest pain EXAM: PORTABLE CHEST 1 VIEW COMPARISON:  Radiograph 12/02/2023 and CT chest 03/09/2024 FINDINGS: Stable cardiomediastinal silhouette. Linear scarring in the left lower lung. Nodular opacity in the left mid lung near the hilum is indeterminate for a pulmonary nodule versus is vessel seen en face. No pleural effusion or pneumothorax. IMPRESSION: Nodular opacity in the left mid lung near the hilum is indeterminate for a pulmonary  nodule versus is vessel seen en face. Consider further evaluation with chest CT. Linear scarring in the left lower lung. Electronically Signed   By: Norman Gatlin M.D.   On: 05/08/2024 16:57     Procedures   Medications Ordered in the ED  promethazine (PHENERGAN) 12.5 mg in sodium chloride  0.9 % 50 mL IVPB (12.5 mg Intravenous New Bag/Given 05/08/24 1809)  sodium chloride  0.9 % bolus 1,000 mL (1,000 mLs Intravenous New Bag/Given 05/08/24 1649)  sodium chloride  0.9 % bolus 1,000 mL (0 mLs Intravenous Stopped 05/08/24 1820)  iohexol  (OMNIPAQUE ) 300 MG/ML solution 75 mL (75 mLs Intravenous Contrast Given 05/08/24 1736)  famotidine  (PEPCID ) IVPB 20 mg premix (0 mg Intravenous Stopped 05/08/24 1821)  pantoprazole (PROTONIX) injection 40 mg (40 mg Intravenous Given 05/08/24 1759)  fentaNYL  (SUBLIMAZE ) injection 50 mcg (50 mcg Intravenous Given 05/08/24 1810)                                    Medical Decision Making Amount and/or Complexity of Data Reviewed Labs: ordered. Radiology: ordered.  Risk Prescription drug management. Decision regarding hospitalization.   78 yo male with PMH of HTN, orthopnea, COPD, TIA, RBBB, and Stage IIIA (T3, N2, M0) non-small cell lung cancer presenting for generalized weakness with decreased PO intake for 6 days.   Differential diagnosis includes but is not limited to postradiation esophageal strictures, esophageal irritation, lower on the differential includes bowel obstruction but thought to be less likely due to patient's lack of abdominal pain, nausea, or or vomiting.  On arrival patient is alert and oriented x 3, no acute distress, afebrile, hypotensive with a blood pressure of 105/91 and a pulse rate of 55.  Patient does have a history of hypertension but has not taken his antihypertensives in the past 3 days due to decreased p.o. intake.  2 L of IV fluids initiated.  Concerns for dehydration.  Electrolytes pending.   On reevaluation, blood pressure has  improved significantly.  Electrolytes are now stable.  Patient came back from CT with a significant amount of chest pain after being lie down flat.  Blood pressure is actually hypertensive and patient is tachycardic currently.  He has not had version to Percocet and Norco secondary to vomiting.  Will give fentanyl  for pain control.  I spoke with on-call oncology team who recommends admitting the patient for IV fluid hydration and for evaluation in the morning.  Recommend starting a PPI.  IV medications ordered as patient cannot tolerate liquids at this time-protonix and pepcid . Fentanyl  for pain. CT chest demonstrates  stable mass without mass effect.  Suggestive of esophagitis.  I spoke with the hospitalist team who agrees to accept patient for post radiation esophagitis.       Final diagnoses:  Radiation-induced esophagitis    ED Discharge Orders     None          Elnor Bernarda SQUIBB, DO 05/08/24 1824

## 2024-05-08 NOTE — ED Notes (Signed)
 Patient cleaned up and new linen.

## 2024-05-08 NOTE — ED Triage Notes (Signed)
 Patient c/o weakness x 1 week. Patient report unable to keep anything down. Patient report he only had half of ensure and small sips of water since father's day. Patient report nausea denies vomiting. Patient denies fever. Radiation treatment was last Friday. Hx Lung Ca.

## 2024-05-09 ENCOUNTER — Other Ambulatory Visit

## 2024-05-09 ENCOUNTER — Ambulatory Visit

## 2024-05-09 ENCOUNTER — Telehealth: Payer: Self-pay | Admitting: Medical Oncology

## 2024-05-09 DIAGNOSIS — R079 Chest pain, unspecified: Secondary | ICD-10-CM | POA: Diagnosis not present

## 2024-05-09 DIAGNOSIS — Z79899 Other long term (current) drug therapy: Secondary | ICD-10-CM | POA: Diagnosis not present

## 2024-05-09 DIAGNOSIS — J449 Chronic obstructive pulmonary disease, unspecified: Secondary | ICD-10-CM

## 2024-05-09 DIAGNOSIS — I4510 Unspecified right bundle-branch block: Secondary | ICD-10-CM | POA: Diagnosis present

## 2024-05-09 DIAGNOSIS — E860 Dehydration: Secondary | ICD-10-CM | POA: Diagnosis present

## 2024-05-09 DIAGNOSIS — D630 Anemia in neoplastic disease: Secondary | ICD-10-CM | POA: Diagnosis present

## 2024-05-09 DIAGNOSIS — Y842 Radiological procedure and radiotherapy as the cause of abnormal reaction of the patient, or of later complication, without mention of misadventure at the time of the procedure: Secondary | ICD-10-CM | POA: Diagnosis present

## 2024-05-09 DIAGNOSIS — I2510 Atherosclerotic heart disease of native coronary artery without angina pectoris: Secondary | ICD-10-CM | POA: Diagnosis present

## 2024-05-09 DIAGNOSIS — Z85828 Personal history of other malignant neoplasm of skin: Secondary | ICD-10-CM | POA: Diagnosis not present

## 2024-05-09 DIAGNOSIS — Z8673 Personal history of transient ischemic attack (TIA), and cerebral infarction without residual deficits: Secondary | ICD-10-CM | POA: Diagnosis not present

## 2024-05-09 DIAGNOSIS — E440 Moderate protein-calorie malnutrition: Secondary | ICD-10-CM | POA: Diagnosis present

## 2024-05-09 DIAGNOSIS — Z87891 Personal history of nicotine dependence: Secondary | ICD-10-CM | POA: Diagnosis not present

## 2024-05-09 DIAGNOSIS — R1310 Dysphagia, unspecified: Secondary | ICD-10-CM | POA: Diagnosis present

## 2024-05-09 DIAGNOSIS — Z860100 Personal history of colon polyps, unspecified: Secondary | ICD-10-CM | POA: Diagnosis not present

## 2024-05-09 DIAGNOSIS — Z6827 Body mass index (BMI) 27.0-27.9, adult: Secondary | ICD-10-CM | POA: Diagnosis not present

## 2024-05-09 DIAGNOSIS — C3412 Malignant neoplasm of upper lobe, left bronchus or lung: Secondary | ICD-10-CM

## 2024-05-09 DIAGNOSIS — T66XXXA Radiation sickness, unspecified, initial encounter: Secondary | ICD-10-CM | POA: Diagnosis not present

## 2024-05-09 DIAGNOSIS — K208 Other esophagitis without bleeding: Secondary | ICD-10-CM | POA: Diagnosis present

## 2024-05-09 DIAGNOSIS — Z888 Allergy status to other drugs, medicaments and biological substances status: Secondary | ICD-10-CM | POA: Diagnosis not present

## 2024-05-09 DIAGNOSIS — I10 Essential (primary) hypertension: Secondary | ICD-10-CM | POA: Diagnosis present

## 2024-05-09 DIAGNOSIS — Z96641 Presence of right artificial hip joint: Secondary | ICD-10-CM | POA: Diagnosis present

## 2024-05-09 DIAGNOSIS — C3490 Malignant neoplasm of unspecified part of unspecified bronchus or lung: Secondary | ICD-10-CM | POA: Diagnosis present

## 2024-05-09 LAB — BASIC METABOLIC PANEL WITH GFR
Anion gap: 11 (ref 5–15)
BUN: 23 mg/dL (ref 8–23)
CO2: 21 mmol/L — ABNORMAL LOW (ref 22–32)
Calcium: 8.8 mg/dL — ABNORMAL LOW (ref 8.9–10.3)
Chloride: 110 mmol/L (ref 98–111)
Creatinine, Ser: 0.82 mg/dL (ref 0.61–1.24)
GFR, Estimated: >60 mL/min (ref >60)
Glucose, Bld: 112 mg/dL — ABNORMAL HIGH (ref 70–99)
Potassium: 3.8 mmol/L (ref 3.5–5.1)
Sodium: 142 mmol/L (ref 135–145)

## 2024-05-09 LAB — CBC
HCT: 34.4 % — ABNORMAL LOW (ref 39.0–52.0)
Hemoglobin: 11.7 g/dL — ABNORMAL LOW (ref 13.0–17.0)
MCH: 32.9 pg (ref 26.0–34.0)
MCHC: 34 g/dL (ref 30.0–36.0)
MCV: 96.6 fL (ref 80.0–100.0)
Platelets: 150 10*3/uL (ref 150–400)
RBC: 3.56 MIL/uL — ABNORMAL LOW (ref 4.22–5.81)
RDW: 13.6 % (ref 11.5–15.5)
WBC: 3.8 10*3/uL — ABNORMAL LOW (ref 4.0–10.5)
nRBC: 0 % (ref 0.0–0.2)

## 2024-05-09 MED ORDER — MAGIC MOUTHWASH W/LIDOCAINE
15.0000 mL | Freq: Four times a day (QID) | ORAL | Status: DC
  Administered 2024-05-09 – 2024-05-12 (×12): 15 mL via ORAL
  Administered 2024-05-12: 5 mL via ORAL
  Administered 2024-05-13 – 2024-05-17 (×16): 15 mL via ORAL
  Administered 2024-05-17: 5 mL via ORAL
  Administered 2024-05-17 – 2024-05-19 (×5): 15 mL via ORAL
  Filled 2024-05-09 (×44): qty 15

## 2024-05-09 MED ORDER — LACTATED RINGERS IV SOLN: INTRAVENOUS | Status: DC

## 2024-05-09 MED ORDER — METOPROLOL TARTRATE 5 MG/5ML IV SOLN
5.0000 mg | Freq: Three times a day (TID) | INTRAVENOUS | Status: DC
  Administered 2024-05-09 – 2024-05-19 (×29): 5 mg via INTRAVENOUS
  Filled 2024-05-09 (×29): qty 5

## 2024-05-09 MED ORDER — LACTATED RINGERS IV SOLN: INTRAVENOUS | Status: AC

## 2024-05-09 MED ORDER — ENSURE PLUS HIGH PROTEIN PO LIQD
237.0000 mL | Freq: Two times a day (BID) | ORAL | Status: DC
  Administered 2024-05-10 – 2024-05-11 (×3): 237 mL via ORAL

## 2024-05-09 MED ORDER — MORPHINE SULFATE (PF) 2 MG/ML IV SOLN
1.0000 mg | INTRAVENOUS | Status: DC | PRN
  Administered 2024-05-09 – 2024-05-15 (×28): 1 mg via INTRAVENOUS
  Filled 2024-05-09 (×28): qty 1

## 2024-05-09 NOTE — Progress Notes (Signed)
 PROGRESS NOTE    Jackson Powers  FMW:[id number] DOB: [birth year] DOA: 05/08/2024 PCP: Montey Lot, PA-C    Chief Complaint  Patient presents with   Weakness    Brief Narrative:  Patient 78 year old gentleman history of stage III NSCLC on concurrent chemoradiation, COPD, hypertension, RBBB who was admitted with a radiation-induced esophagitis with decreased oral intake and dehydration.   Assessment & Plan:   Principal Problem:   Radiation-induced esophagitis Active Problems:   Chest pain   Lung cancer (HCC)   Essential hypertension   COPD (chronic obstructive pulmonary disease) (HCC)   Dehydration  #1 radiation-induced esophagitis -Patient with significantly poor oral intake unable to maintain adequate oral intake for 7 to 10 days. - CT chest done with diffuse esophageal wall thickening most notably in the mid and lower esophagus concerning for esophagitis. - Patient currently on Carafate  which she states has not been helping much at home. -Continue Carafate . -Place on Magic mouthwash with lidocaine . - Continue IV fluid. - Patient placed on a soft diet as tolerated however patient unable to keep anything down today. - Oncology informed of admission via epic.  2.  Chest pain -Per admitting physician patient noted to have developed persistent central chest pain after returning from CT scan. - Patient noted to have [facility] November 2021 that showed nonobstructive CAD. - CT chest with diffuse coronary disease and also findings concerning for esophagitis. - Cardiac enzymes negative. - Continue telemetry. - Nitroglycerin as needed. - Change Toprol-XL to IV Lopressor as patient unable to tolerate oral intake at this time. - Patient started on aspirin  81 mg daily if able to tolerate.  3.  Stage IIIa non-small cell lung cancer -Being followed by medical oncology Dr. Gatha Gatha on concurrent chemoradiation. - CT chest done with stable left hilar/medial left upper lobe soft  tissue mass and stable right upper pulmonary nodule. - Oncology informed of admission and both oncology and radiation oncology recommending holding treatment this week.  4.  COPD -Stable. - Continue Trelegy, Ellipta.  5.  Hypertension -Patient with difficulty with oral intake. - Discontinue Toprol-XL and placed on IV Lopressor scheduled. - Hold amlodipine  for now. - IV hydralazine  as needed.  6.  History of CVA -Aspirin  if able to tolerate for secondary stroke prophylaxis. - Not on a statin prior to admission.  7.  Dehydration -IV fluids.   DVT prophylaxis: Lovenox  Code Status: Full Family Communication: Updated patient and wife at bedside. Disposition: Likely home when clinically improved, tolerating oral intake.  Status is: Inpatient Remains inpatient appropriate because: Severity of illness   Consultants:  None  Procedures:  CT chest 05/08/2024 Chest x-ray 05/08/2024  Antimicrobials:  Anti-infectives (From admission, onward)    None         Subjective: Patient sitting up in bed.  Still with complaints of difficulty swallowing even clear liquids at this time.  Denies any chest pain or shortness of breath.  Wife at bedside.  Objective: Vitals:   05/08/24 2237 05/09/24 0013 05/09/24 0134 05/09/24 0601  BP: (!) 153/79  (!) 140/70 135/75  Pulse: 94  88 84  Resp: 16  18 19   Temp:  (!) 97.5 F (36.4 C) 98.3 F (36.8 C) 98.8 F (37.1 C)  TempSrc:  Oral Oral   SpO2: 97%  94% 96%  Weight:      Height:        Intake/Output Summary (Last 24 hours) at 05/09/2024 1733 Last data filed at 05/09/2024 1135 Gross per 24 hour  Intake  3953.88 ml  Output --  Net 3953.88 ml   Filed Weights   05/08/24 1525 05/08/24 2135  Weight: 105.6 kg 101.8 kg    Examination:  General exam: Appears calm and comfortable  Respiratory system: Clear to auscultation. Respiratory effort normal. Cardiovascular system: S1 & S2 heard, RRR. No JVD, murmurs, rubs, gallops or clicks. No  pedal edema. Gastrointestinal system: Abdomen is nondistended, soft and nontender. No organomegaly or masses felt. Normal bowel sounds heard. Central nervous system: Alert and oriented. No focal neurological deficits. Extremities: Symmetric 5 x 5 power. Skin: No rashes, lesions or ulcers Psychiatry: Judgement and insight appear normal. Mood & affect appropriate.     Data Reviewed: I have personally reviewed following labs and imaging studies  CBC: Recent Labs  Lab 05/08/24 1556 05/09/24 0459  WBC 4.4 3.8*  NEUTROABS 3.5  --   HGB 14.2 11.7*  HCT 41.9 34.4*  MCV 93.9 96.6  PLT 189 150    Basic Metabolic Panel: Recent Labs  Lab 05/08/24 1556 05/08/24 1644 05/09/24 0459  NA 138  --  142  K 3.6  --  3.8  CL 103  --  110  CO2 21*  --  21*  GLUCOSE 126*  --  112*  BUN 33*  --  23  CREATININE 0.95  --  0.82  CALCIUM  9.7  --  8.8*  MG  --  2.1  --     GFR: Estimated Creatinine Clearance: 94.4 mL/min (by C-G formula based on SCr of 0.82 mg/dL).  Liver Function Tests: Recent Labs  Lab 05/08/24 1556  AST 27  ALT 31  ALKPHOS 48  BILITOT 0.9  PROT 7.7  ALBUMIN 4.5    CBG: No results for input(s): GLUCAP in the last 168 hours.   No results found for this or any previous visit (from the past 240 hours).       Radiology Studies: CT Chest W Contrast Result Date: 05/08/2024 CLINICAL DATA:  difficulty swallowing, hx of lung cancer, likely post radiation, r/o mass effect EXAM: CT CHEST WITH CONTRAST TECHNIQUE: Multidetector CT imaging of the chest was performed during intravenous contrast administration. RADIATION DOSE REDUCTION: This exam was performed according to the departmental dose-optimization program which includes automated exposure control, adjustment of the mA and/or kV according to patient size and/or use of iterative reconstruction technique. CONTRAST:  75mL OMNIPAQUE  IOHEXOL  300 MG/ML  SOLN COMPARISON:  03/09/2024 FINDINGS: Cardiovascular: Heart is  normal size. Aorta is normal caliber. No filling defects in the pulmonary arteries to suggest pulmonary emboli. Diffuse coronary artery calcifications and scattered aortic atherosclerosis. Mediastinum/Nodes: No mediastinal adenopathy. Stable left hilar soft tissue without discrete measurable node. No right hilar or axillary adenopathy. Esophagus appears diffusely thickened, most notable in the mid and lower esophagus suggesting esophagitis. Small hiatal hernia. Trachea and thyroid  unremarkable. Lungs/Pleura: Left hilar/medial upper lobe soft tissue measures approximately 5.5 x 2.0 cm, not significantly changed since prior study. Well-circumscribed peripheral sub pleural nodule in the right upper lobe measures up to 11 mm compared to 12 mm previously. No new or enlarging pulmonary nodules. Linear scarring in the lingula and left lower lobe. Mild centrilobular emphysema. No pleural effusions. Upper Abdomen: No acute findings Musculoskeletal: Chest wall soft tissues are unremarkable. No acute bony abnormality or focal osseous lesion. IMPRESSION: Stable left hilar/medial left upper lobe soft tissue/mass. Stable right upper lobe pulmonary nodule. Diffuse esophageal wall thickening, most notable in the mid and lower esophagus concerning for esophagitis. Diffuse coronary artery disease. Aortic Atherosclerosis (  ICD10-I70.0) and Emphysema (ICD10-J43.9). Electronically Signed   By: Franky Crease M.D.   On: 05/08/2024 18:05   DG Chest Port 1 View Result Date: 05/08/2024 CLINICAL DATA:  Weakness and chest pain EXAM: PORTABLE CHEST 1 VIEW COMPARISON:  Radiograph 12/02/2023 and CT chest 03/09/2024 FINDINGS: Stable cardiomediastinal silhouette. Linear scarring in the left lower lung. Nodular opacity in the left mid lung near the hilum is indeterminate for a pulmonary nodule versus is vessel seen en face. No pleural effusion or pneumothorax. IMPRESSION: Nodular opacity in the left mid lung near the hilum is indeterminate for a  pulmonary nodule versus is vessel seen en face. Consider further evaluation with chest CT. Linear scarring in the left lower lung. Electronically Signed   By: Norman Gatlin M.D.   On: 05/08/2024 16:57        Scheduled Meds:  amLODipine   10 mg Oral Daily   aspirin  EC  81 mg Oral Daily   budesonide-glycopyrrolate-formoterol  2 puff Inhalation BID   enoxaparin  (LOVENOX ) injection  40 mg Subcutaneous Q24H   feeding supplement  237 mL Oral BID BM   magic mouthwash w/lidocaine   15 mL Oral QID   metoprolol succinate  25 mg Oral Daily   pantoprazole (PROTONIX) IV  40 mg Intravenous Q12H   sodium chloride  flush  3 mL Intravenous Q12H   sucralfate   1 g Oral TID AC & HS   Continuous Infusions:  lactated ringers  125 mL/hr at 05/09/24 1221     LOS: 0 days    Time spent: 40 minutes    Toribio Hummer, MD [facility]   To contact the attending provider between 7A-7P or the covering provider during after hours 7P-7A, please log into the web site [email or web address] and access using universal [place] password for that web site. If you do not have the password, please call the hospital operator.  05/09/2024, 5:33 PM

## 2024-05-09 NOTE — Progress Notes (Signed)
 Pt able to drink whole ensure, half of an applesauce, and PO Carafate  after taking magic mouthwash. Pt did require morphine  to keep the pain down during the process.

## 2024-05-09 NOTE — Plan of Care (Signed)
  Problem: Education: Goal: Knowledge of General Education information will improve Description: Including pain rating scale, medication(s)/side effects and non-pharmacologic comfort measures Outcome: Progressing   Problem: Pain Managment: Goal: General experience of comfort will improve and/or be controlled Outcome: Progressing   Problem: Skin Integrity: Goal: Risk for impaired skin integrity will decrease Outcome: Progressing

## 2024-05-09 NOTE — TOC Initial Note (Signed)
 Transition of Care [facility]) - Initial/Assessment Note    Patient Details  Name: Jackson Powers MRN: [id number] Date of Birth: November 20, 1945  [facility] CM/SW Contact:    Bascom Service, RN Phone Number: 05/09/2024, 12:11 PM  Clinical Narrative:  d/c plan home.                 Expected Discharge Plan: Home/Self Care Barriers to Discharge: Continued Medical Work up   Patient Goals and CMS Choice Patient states their goals for this hospitalization and ongoing recovery are:: Home CMS Medicare.gov Compare Post Acute Care list provided to:: Patient Choice offered to / list presented to : Patient      Expected Discharge Plan and Services     Post Acute Care Choice: Resumption of Svcs/PTA Provider Living arrangements for the past 2 months: Single Family Home                                      Prior Living Arrangements/Services Living arrangements for the past 2 months: Single Family Home Lives with:: Spouse                   Activities of Daily Living   ADL Screening (condition at time of admission) Independently performs ADLs?: Yes (appropriate for developmental age) Is the patient deaf or have difficulty hearing?: Yes Does the patient have difficulty seeing, even when wearing glasses/contacts?: No Does the patient have difficulty concentrating, remembering, or making decisions?: No  Permission Sought/Granted                  Emotional Assessment              Admission diagnosis:  Radiation-induced esophagitis [K20.80, T66.XXXA] Patient Active Problem List   Diagnosis Date Noted   Radiation-induced esophagitis 05/08/2024   Chest pain 05/08/2024   COPD (chronic obstructive pulmonary disease) (HCC)    Lung cancer (HCC) 12/14/2023   Lung nodule 11/23/2023   TIA (transient ischemic attack) 12/17/2020   Essential hypertension 12/17/2020   Unstable angina (HCC) 09/26/2020   OA (osteoarthritis) of hip 07/20/2018   PCP:  Montey Lot,  PA-C Pharmacy:   [facility] [phone] - [address],  - [address] AT [facility] [address] [place] [address] Phone: [phone] Fax: [phone]     Social Drivers of Health (SDOH) Social History: SDOH Screenings   Food Insecurity: No Food Insecurity (05/08/2024)  Housing: Low Risk  (05/08/2024)  Transportation Needs: No Transportation Needs (05/08/2024)  Utilities: Not At Risk (05/08/2024)  Depression (PHQ2-9): Low Risk  (04/03/2024)  Social Connections: Moderately Isolated (05/08/2024)  Tobacco Use: Medium Risk (05/08/2024)   SDOH Interventions:     Readmission Risk Interventions     No data to display

## 2024-05-09 NOTE — Telephone Encounter (Addendum)
 Jackson Powers wants clarification of CT results from yesterday. She read no significant changes on the report.  Does that  mean the radiaiton did not do anything.?

## 2024-05-10 ENCOUNTER — Other Ambulatory Visit: Payer: Self-pay

## 2024-05-10 ENCOUNTER — Ambulatory Visit

## 2024-05-10 DIAGNOSIS — T66XXXA Radiation sickness, unspecified, initial encounter: Secondary | ICD-10-CM | POA: Diagnosis not present

## 2024-05-10 DIAGNOSIS — K208 Other esophagitis without bleeding: Secondary | ICD-10-CM | POA: Diagnosis not present

## 2024-05-10 LAB — RENAL FUNCTION PANEL
Albumin: 3.2 g/dL — ABNORMAL LOW (ref 3.5–5.0)
Anion gap: 8 (ref 5–15)
BUN: 17 mg/dL (ref 8–23)
CO2: 24 mmol/L (ref 22–32)
Calcium: 8.6 mg/dL — ABNORMAL LOW (ref 8.9–10.3)
Chloride: 104 mmol/L (ref 98–111)
Creatinine, Ser: 0.71 mg/dL (ref 0.61–1.24)
GFR, Estimated: >60 mL/min (ref >60)
Glucose, Bld: 90 mg/dL (ref 70–99)
Phosphorus: 2.8 mg/dL (ref 2.5–4.6)
Potassium: 3.3 mmol/L — ABNORMAL LOW (ref 3.5–5.1)
Sodium: 136 mmol/L (ref 135–145)

## 2024-05-10 LAB — MAGNESIUM: Magnesium: 1.9 mg/dL (ref 1.7–2.4)

## 2024-05-10 LAB — CBC
HCT: 34.7 % — ABNORMAL LOW (ref 39.0–52.0)
Hemoglobin: 11.7 g/dL — ABNORMAL LOW (ref 13.0–17.0)
MCH: 32.7 pg (ref 26.0–34.0)
MCHC: 33.7 g/dL (ref 30.0–36.0)
MCV: 96.9 fL (ref 80.0–100.0)
Platelets: 132 10*3/uL — ABNORMAL LOW (ref 150–400)
RBC: 3.58 MIL/uL — ABNORMAL LOW (ref 4.22–5.81)
RDW: 13.5 % (ref 11.5–15.5)
WBC: 2.6 10*3/uL — ABNORMAL LOW (ref 4.0–10.5)
nRBC: 0 % (ref 0.0–0.2)

## 2024-05-10 MED ORDER — POTASSIUM CHLORIDE 10 MEQ/100ML IV SOLN
10.0000 meq | INTRAVENOUS | Status: AC
  Administered 2024-05-10 (×4): 10 meq via INTRAVENOUS
  Filled 2024-05-10 (×4): qty 100

## 2024-05-10 NOTE — Progress Notes (Signed)
  Progress Note   Patient: Jackson Powers FMW:[id number] DOB: [birth year] DOA: 05/08/2024     1 DOS: the patient was seen and examined on 05/10/2024   Brief hospital course: Jackson Powers is a 78 y.o. male with medical history significant for stage IIIa NSCLC on concurrent chemoradiation, COPD, HTN, RBBB who is admitted with radiation-induced esophagitis.  Assessment and Plan: #1 radiation-induced esophagitis -Patient with significantly poor oral intake unable to maintain adequate oral intake for 7 to 10 days. - CT chest done with diffuse esophageal wall thickening most notably in the mid and lower esophagus concerning for esophagitis. - Patient currently on Carafate  which she states has not been helping much at home. -On magic mouthwash with lidocaine . - Patient placed on a soft diet as tolerated however patient unable to keep anything down today. - Oncology following   2.  Chest pain -Per admitting physician patient noted to have developed persistent central chest pain after returning from CT scan. - Patient noted to have [facility] November 2021 that showed nonobstructive CAD. - CT chest with diffuse coronary disease and also findings concerning for esophagitis. - Cardiac enzymes negative. - Continue telemetry. - Nitroglycerin as needed. - Change Toprol-XL to IV Lopressor as patient unable to tolerate oral intake at this time. - Patient started on aspirin  81 mg daily if able to tolerate.   3.  Stage IIIa non-small cell lung cancer -Being followed by medical oncology Dr. Gatha Gatha on concurrent chemoradiation. - CT chest done with stable left hilar/medial left upper lobe soft tissue mass and stable right upper pulmonary nodule. - Oncology following   4.  COPD -Stable. - Continue Trelegy, Ellipta.   5.  Hypertension - Patient with difficulty with oral intake. - Discontinue Toprol-XL and placed on IV Lopressor scheduled. - Hold amlodipine  for now. - IV hydralazine  as needed.   6.   History of CVA -Aspirin  if able to tolerate for secondary stroke prophylaxis. - Not on a statin prior to admission.   7.  Dehydration -IV fluids.      Subjective: Able to tolerate apple sauce and ensure thus far  Physical Exam: Vitals:   05/09/24 0601 05/09/24 2024 05/10/24 0416 05/10/24 1149  BP: 135/75 (!) 140/75 (!) 155/80 (!) 149/81  Pulse: 84 80 70 71  Resp: 19 20 18 19   Temp: 98.8 F (37.1 C) 98.6 F (37 C) 98.2 F (36.8 C) 98.4 F (36.9 C)  TempSrc:  Oral Oral Oral  SpO2: 96% 95% 95% 95%  Weight:      Height:       General exam: Awake, laying in bed, in nad Respiratory system: Normal respiratory effort, no wheezing Cardiovascular system: regular rate, s1, s2 Gastrointestinal system: Soft, nondistended, positive BS Central nervous system: CN2-12 grossly intact, strength intact Extremities: Perfused, no clubbing Skin: Normal skin turgor, no notable skin lesions seen Psychiatry: Mood normal // no visual hallucinations   Data Reviewed:  Labs reviewed: Na 136, K 3.3, Cr 0.71, WBC 2.6, Hgb 11.7, Plts 132  Family Communication: Pt in room, family at bedside  Disposition: Status is: Inpatient Remains inpatient appropriate because: Severity of illness  Planned Discharge Destination: Home    Author: Garnette Pelt, MD 05/10/2024 3:35 PM  For on call review [email or web address].

## 2024-05-10 NOTE — Progress Notes (Signed)
 Mobility Specialist - Progress Note   05/10/24 1000  Mobility  Activity Ambulated with assistance in hallway;Ambulated with assistance to bathroom  Level of Assistance Modified independent, requires aide device or extra time  Assistive Device Front wheel walker  Distance Ambulated (ft) 500 ft  Activity Response Tolerated well  Mobility Referral Yes  Mobility visit 1 Mobility  Mobility Specialist Start Time (ACUTE ONLY) [id number]  Mobility Specialist Stop Time (ACUTE ONLY) 1000  Mobility Specialist Time Calculation (min) (ACUTE ONLY) 9 min   Pt received in bed and agreeable to mobility. No complaints during session. Pt to bathroom after session with all needs met.    [facility]

## 2024-05-10 NOTE — Plan of Care (Signed)
   Problem: Nutrition: Goal: Adequate nutrition will be maintained Outcome: Progressing   Problem: Coping: Goal: Level of anxiety will decrease Outcome: Progressing   Problem: Pain Managment: Goal: General experience of comfort will improve and/or be controlled Outcome: Progressing

## 2024-05-10 NOTE — Progress Notes (Addendum)
 Jackson Powers   DOB:01/21/1946   FM#:[id number]      ASSESSMENT & PLAN:  Jackson Powers is a 78 year old male patient with oncologic history significant for Non-small cell lung cancer. He has been receiving radiation therapy and is now admitted due to radiation induced esophagitis/throat pain.  Non-small cell lung cancer (T3, N2, M0) -- Diagnosed 12/02/2023 left upper lobe mass with right upper lobe pulmonary nodule.  -- On neoadjuvant chemotherapy with Carbo/Alimta/Nivolumab , initiated on 12/30/23. -- On radiation therapy, 30 fractions planned, completed 19, last given 05/05/24. -- CT chest done 05/08/24 shows stable mass.  -- Follows with Medical Oncology/Dr. Sherrod  Radiation-induced Esophagitis -- patient complains of poor oral intake and has been unable to swallow for over one week -- On carafate , magic mouthwash with lidocaine , continue as ordered -- Radiation therapy is currently on hold this week.  -- Rad Onc will reevaluate when to restart in view of esophagitis.  Anemia Leukopenia -- Mild -- Hemoglobin 11.7, stable.  No intervention required at this time.  -- WBC 2.6 -- monitor CBC with diff closely    Code Status Full  Subjective:  Patient seen awake and alert laying in bed. Complains of throat pain, states difficult to swallow and eat also.  No other complaints offered. Multiple family members at bedside.   Objective:   Intake/Output Summary (Last 24 hours) at 05/10/2024 1210 Last data filed at 05/10/2024 1023 Gross per 24 hour  Intake 1381.34 ml  Output 450 ml  Net 931.34 ml     PHYSICAL EXAMINATION: ECOG PERFORMANCE STATUS: 2 - Symptomatic, <50% confined to bed  Vitals:   05/10/24 0416 05/10/24 1149  BP: (!) 155/80 (!) 149/81  Pulse: 70 71  Resp: 18 19  Temp: 98.2 F (36.8 C) 98.4 F (36.9 C)  SpO2: 95% 95%   Filed Weights   05/08/24 1525 05/08/24 2135  Weight: 232 lb 12.9 oz (105.6 kg) 224 lb 6.9 oz (101.8 kg)    GENERAL: alert, no distress and  comfortable SKIN: skin color, texture, turgor are normal, no rashes or significant lesions EYES: normal, conjunctiva are pink and non-injected, sclera clear OROPHARYNX: no exudate, no erythema and lips, buccal mucosa, and tongue normal  NECK: supple, thyroid  normal size, non-tender, without nodularity LYMPH: no palpable lymphadenopathy in the cervical, axillary or inguinal LUNGS: clear to auscultation and percussion with normal breathing effort HEART: regular rate & rhythm and no murmurs and no lower extremity edema ABDOMEN: abdomen soft, non-tender and normal bowel sounds MUSCULOSKELETAL: no cyanosis of digits and no clubbing  PSYCH: alert & oriented x 3 with fluent speech NEURO: no focal motor/sensory deficits   All questions were answered. The patient knows to call the clinic with any problems, questions or concerns.   The total time spent in the appointment was 40 minutes encounter with patient including review of chart and various tests results, discussions about plan of care and coordination of care plan  Olam JINNY Brunner, NP 05/10/2024 12:10 PM    Labs Reviewed:  Lab Results  Component Value Date   WBC 2.6 (L) 05/10/2024   HGB 11.7 (L) 05/10/2024   HCT 34.7 (L) 05/10/2024   MCV 96.9 05/10/2024   PLT 132 (L) 05/10/2024   Recent Labs    04/25/24 1156 05/02/24 1345 05/08/24 1556 05/09/24 0459 05/10/24 0508  NA 138 140 138 142 136  K 4.0 3.7 3.6 3.8 3.3*  CL 107 107 103 110 104  CO2 24 24 21* 21* 24  GLUCOSE  118* 99 126* 112* 90  BUN 17 19 33* 23 17  CREATININE 0.92 0.97 0.95 0.82 0.71  CALCIUM  9.3 9.7 9.7 8.8* 8.6*  GFRNONAA >60 >60 >60 >60 >60  PROT 7.3 7.8 7.7  --   --   ALBUMIN 4.4 4.6 4.5  --  3.2*  AST 24 19 27   --   --   ALT 28 22 31   --   --   ALKPHOS 49 49 48  --   --   BILITOT 0.5 0.8 0.9  --   --     Studies Reviewed:  CT Chest W Contrast Result Date: 05/08/2024 CLINICAL DATA:  difficulty swallowing, hx of lung cancer, likely post radiation, r/o mass  effect EXAM: CT CHEST WITH CONTRAST TECHNIQUE: Multidetector CT imaging of the chest was performed during intravenous contrast administration. RADIATION DOSE REDUCTION: This exam was performed according to the departmental dose-optimization program which includes automated exposure control, adjustment of the mA and/or kV according to patient size and/or use of iterative reconstruction technique. CONTRAST:  75mL OMNIPAQUE  IOHEXOL  300 MG/ML  SOLN COMPARISON:  03/09/2024 FINDINGS: Cardiovascular: Heart is normal size. Aorta is normal caliber. No filling defects in the pulmonary arteries to suggest pulmonary emboli. Diffuse coronary artery calcifications and scattered aortic atherosclerosis. Mediastinum/Nodes: No mediastinal adenopathy. Stable left hilar soft tissue without discrete measurable node. No right hilar or axillary adenopathy. Esophagus appears diffusely thickened, most notable in the mid and lower esophagus suggesting esophagitis. Small hiatal hernia. Trachea and thyroid  unremarkable. Lungs/Pleura: Left hilar/medial upper lobe soft tissue measures approximately 5.5 x 2.0 cm, not significantly changed since prior study. Well-circumscribed peripheral sub pleural nodule in the right upper lobe measures up to 11 mm compared to 12 mm previously. No new or enlarging pulmonary nodules. Linear scarring in the lingula and left lower lobe. Mild centrilobular emphysema. No pleural effusions. Upper Abdomen: No acute findings Musculoskeletal: Chest wall soft tissues are unremarkable. No acute bony abnormality or focal osseous lesion. IMPRESSION: Stable left hilar/medial left upper lobe soft tissue/mass. Stable right upper lobe pulmonary nodule. Diffuse esophageal wall thickening, most notable in the mid and lower esophagus concerning for esophagitis. Diffuse coronary artery disease. Aortic Atherosclerosis (ICD10-I70.0) and Emphysema (ICD10-J43.9). Electronically Signed   By: Franky Crease M.D.   On: 05/08/2024 18:05    DG Chest Port 1 View Result Date: 05/08/2024 CLINICAL DATA:  Weakness and chest pain EXAM: PORTABLE CHEST 1 VIEW COMPARISON:  Radiograph 12/02/2023 and CT chest 03/09/2024 FINDINGS: Stable cardiomediastinal silhouette. Linear scarring in the left lower lung. Nodular opacity in the left mid lung near the hilum is indeterminate for a pulmonary nodule versus is vessel seen en face. No pleural effusion or pneumothorax. IMPRESSION: Nodular opacity in the left mid lung near the hilum is indeterminate for a pulmonary nodule versus is vessel seen en face. Consider further evaluation with chest CT. Linear scarring in the left lower lung. Electronically Signed   By: Norman Gatlin M.D.   On: 05/08/2024 16:57   ADDENDUM: Hematology/oncology Attending: The patient is seen and examined today.  His wife was at the bedside.  I agree with the above note.  He is a very pleasant 78 years old white male with unresectable stage IIIa non-small cell lung cancer diagnosed in January 2025.  He underwent a course of neoadjuvant chemoimmunotherapy for 3 cycles but on evaluation for surgical resection he was not candidate for surgery.  The patient started a course of concurrent chemoradiation with weekly carboplatin  and Abraxane  status post  4 cycles.  He was admitted to the hospital with esophageal pain and poor p.o. intake secondary to radiation-induced esophagitis.  He is currently on IV hydration in addition to Carafate  and pain medications.  He is feeling a little bit better but no complete resolution of his symptoms.  He has some concern about resuming his radiotherapy.  He will discuss with Dr. Shannon his condition. I recommended for the patient to continue with the current management for his symptoms for now.  We will see him back for follow-up visit 1 week after discharge before resuming his treatment. The patient and his wife are in agreement with the current plan. Thank you for taking good care of Mr. Winterbottom.  Please  call if you have any questions. Disclaimer: This note was dictated with voice recognition software. Similar sounding words can inadvertently be transcribed and may be missed upon review. Sherrod MARLA Sherrod, MD

## 2024-05-11 ENCOUNTER — Ambulatory Visit

## 2024-05-11 DIAGNOSIS — K208 Other esophagitis without bleeding: Secondary | ICD-10-CM | POA: Diagnosis not present

## 2024-05-11 DIAGNOSIS — T66XXXA Radiation sickness, unspecified, initial encounter: Secondary | ICD-10-CM | POA: Diagnosis not present

## 2024-05-11 DIAGNOSIS — E440 Moderate protein-calorie malnutrition: Secondary | ICD-10-CM | POA: Insufficient documentation

## 2024-05-11 LAB — CBC
HCT: 34.7 % — ABNORMAL LOW (ref 39.0–52.0)
Hemoglobin: 11.8 g/dL — ABNORMAL LOW (ref 13.0–17.0)
MCH: 32.8 pg (ref 26.0–34.0)
MCHC: 34 g/dL (ref 30.0–36.0)
MCV: 96.4 fL (ref 80.0–100.0)
Platelets: 131 10*3/uL — ABNORMAL LOW (ref 150–400)
RBC: 3.6 MIL/uL — ABNORMAL LOW (ref 4.22–5.81)
RDW: 13.7 % (ref 11.5–15.5)
WBC: 2.5 10*3/uL — ABNORMAL LOW (ref 4.0–10.5)
nRBC: 0 % (ref 0.0–0.2)

## 2024-05-11 LAB — COMPREHENSIVE METABOLIC PANEL WITH GFR
ALT: 21 U/L (ref 0–44)
AST: 20 U/L (ref 15–41)
Albumin: 3.1 g/dL — ABNORMAL LOW (ref 3.5–5.0)
Alkaline Phosphatase: 35 U/L — ABNORMAL LOW (ref 38–126)
Anion gap: 8 (ref 5–15)
BUN: 12 mg/dL (ref 8–23)
CO2: 25 mmol/L (ref 22–32)
Calcium: 8.7 mg/dL — ABNORMAL LOW (ref 8.9–10.3)
Chloride: 105 mmol/L (ref 98–111)
Creatinine, Ser: 0.7 mg/dL (ref 0.61–1.24)
GFR, Estimated: >60 mL/min (ref >60)
Glucose, Bld: 92 mg/dL (ref 70–99)
Potassium: 3.5 mmol/L (ref 3.5–5.1)
Sodium: 138 mmol/L (ref 135–145)
Total Bilirubin: 0.7 mg/dL (ref 0.0–1.2)
Total Protein: 6 g/dL — ABNORMAL LOW (ref 6.5–8.1)

## 2024-05-11 MED ORDER — ENSURE PLUS HIGH PROTEIN PO LIQD
237.0000 mL | Freq: Four times a day (QID) | ORAL | Status: DC
  Administered 2024-05-11 – 2024-05-17 (×15): 237 mL via ORAL

## 2024-05-11 NOTE — Progress Notes (Signed)
 Mobility Specialist - Progress Note   05/11/24 1010  Mobility  Activity Ambulated independently in hallway  Level of Assistance Independent after set-up  Assistive Device Other (Comment) (IV Pole)  Distance Ambulated (ft) 500 ft  Activity Response Tolerated well  Mobility Referral Yes  Mobility visit 1 Mobility  Mobility Specialist Start Time (ACUTE ONLY) 1003  Mobility Specialist Stop Time (ACUTE ONLY) 1010  Mobility Specialist Time Calculation (min) (ACUTE ONLY) 7 min   Pt received in bed and agreeable to mobility. No complaints during session. Pt to bathroom after session with all needs met.     [facility]

## 2024-05-11 NOTE — Progress Notes (Signed)
 Report received from Morna Moose RN No changes noted at this time with the Patient's Assessment

## 2024-05-11 NOTE — Progress Notes (Signed)
  Progress Note   Patient: Jackson Powers FMW:[id number] DOB: [birth year] DOA: 05/08/2024     2 DOS: the patient was seen and examined on 05/11/2024   Brief hospital course: Jackson Powers is a 78 y.o. male with medical history significant for stage IIIa NSCLC on concurrent chemoradiation, COPD, HTN, RBBB who is admitted with radiation-induced esophagitis.  Assessment and Plan: #1 radiation-induced esophagitis -Patient with significantly poor oral intake unable to maintain adequate oral intake for 7 to 10 days. - CT chest done with diffuse esophageal wall thickening most notably in the mid and lower esophagus concerning for esophagitis. - Patient currently on Carafate  which she states has not been helping much at home. -On magic mouthwash with lidocaine . - Unable to tolerate diet beyond ensure. Will downgrade to clears for now. Try to advance as tolerated -Dietitian consulted - Oncology following   2.  Chest pain -Per admitting physician patient noted to have developed persistent central chest pain after returning from CT scan. - Patient noted to have [facility] November 2021 that showed nonobstructive CAD. - CT chest with diffuse coronary disease and also findings concerning for esophagitis. - Cardiac enzymes negative. - Continue telemetry. - Nitroglycerin as needed. - Change Toprol-XL to IV Lopressor as patient unable to tolerate oral intake at this time. - Patient started on aspirin  81 mg daily if able to tolerate.   3.  Stage IIIa non-small cell lung cancer -Being followed by medical oncology Dr. Gatha Gatha on concurrent chemoradiation. - CT chest done with stable left hilar/medial left upper lobe soft tissue mass and stable right upper pulmonary nodule. - Oncology following   4.  COPD -Stable. - Continue Trelegy, Ellipta.   5.  Hypertension - Patient with difficulty with oral intake. - Discontinue Toprol-XL and placed on IV Lopressor scheduled. - Hold amlodipine  for now. - IV  hydralazine  as needed.   6.  History of CVA -Aspirin  if able to tolerate for secondary stroke prophylaxis. - Not on a statin prior to admission.   7.  Dehydration -IV fluids.  8. Moderate protein calorie malnutrition -Dietician following -If pt is unable to tolerate PO, then ultimately may need tube feeding      Subjective: Tolerating ensure, unable to tolerate anything beyond  Physical Exam: Vitals:   05/10/24 1149 05/10/24 2021 05/11/24 0454 05/11/24 1319  BP: (!) 149/81 (!) 160/83 (!) 173/95 136/80  Pulse: 71 71 75 61  Resp: 19 20 20 20   Temp: 98.4 F (36.9 C) 98.6 F (37 C) 98.2 F (36.8 C) 98.3 F (36.8 C)  TempSrc: Oral Oral Oral Oral  SpO2: 95% 94% 95% 95%  Weight:      Height:       General exam: Conversant, in no acute distress Respiratory system: normal chest rise, clear, no audible wheezing Cardiovascular system: regular rhythm, s1-s2 Gastrointestinal system: Nondistended, nontender, pos BS Central nervous system: No seizures, no tremors Extremities: No cyanosis, no joint deformities Skin: No rashes, no pallor Psychiatry: Affect normal // no auditory hallucinations   Data Reviewed:  Labs reviewed: Na 138, K 3.5, Cr 0.70, WBC 2.5, hgb 11.8, Plts 131  Family Communication: Pt in room, family at bedside  Disposition: Status is: Inpatient Remains inpatient appropriate because: Severity of illness  Planned Discharge Destination: Home    Author: Garnette Pelt, MD 05/11/2024 3:59 PM  For on call review [email or web address].

## 2024-05-11 NOTE — Progress Notes (Signed)
 Initial Nutrition Assessment  DOCUMENTATION CODES:   Non-severe (moderate) malnutrition in context of acute illness/injury  INTERVENTION:   -Increase Ensure Plus High Protein po QID, each supplement provides 350 kcal and 20 grams of protein.   -Monitor for ability to have diet advanced  NUTRITION DIAGNOSIS:   Moderate Malnutrition related to acute illness as evidenced by mild fat depletion, mild muscle depletion, energy intake < 75% for > 7 days, percent weight loss.  GOAL:   Patient will meet greater than or equal to 90% of their needs  MONITOR:   PO intake, Supplement acceptance, Diet advancement  REASON FOR ASSESSMENT:   Consult Assessment of nutrition requirement/status  ASSESSMENT:   78 y.o. male with medical history significant for stage IIIa NSCLC on concurrent chemoradiation, COPD, HTN, RBBB who is admitted with radiation-induced esophagitis.  Patient in room, in good spirits. Feeling okay, does report some difficulty swallowing d/t pain in throat. Last chemoradiation 6/17. Pt reports last eating a normal solid meal on Father's day (6/15). Since then only drinking 1/2 bottles of Ensure and soups. Is sensitive to hot and cold liquids so needs everything to be room temperature. Feels like the Magic mouthwash is helping. Pt was changed to clear liquid diet today. Pt unable to swallow pills at this time per RN. Per RN, pt has drank 6 Ensures since admission on 6/23. Pt will need to increase Ensure amounts. There is a possibility of needing a feeding tube.   Pt reports UBW ~244 lbs. Per weight records, pt has lost  23 lbs since 5/8 (9% wt loss x 1.5 months, significant for time frame).  Medications: Magic mouthwash w/ lidocaine , Carafate , Lactated ringers   Labs reviewed.   NUTRITION - FOCUSED PHYSICAL EXAM:  Flowsheet Row Most Recent Value  Orbital Region Mild depletion  Upper Arm Region Mild depletion  Thoracic and Lumbar Region No depletion  Buccal Region No  depletion  Temple Region No depletion  Clavicle Bone Region Mild depletion  Clavicle and Acromion Bone Region No depletion  Scapular Bone Region No depletion  Dorsal Hand No depletion  Patellar Region No depletion  Anterior Thigh Region No depletion  Posterior Calf Region Mild depletion  Edema (RD Assessment) None  Hair Reviewed  Eyes Reviewed  Mouth Reviewed  Skin Reviewed  Nails Reviewed    Diet Order:   Diet Order             Diet clear liquid Room service appropriate? Yes; Fluid consistency: Thin  Diet effective now                   EDUCATION NEEDS:   Not appropriate for education at this time  Skin:  Skin Assessment: Reviewed RN Assessment  Last BM:  6/24 -type 4  Height:   Ht Readings from Last 1 Encounters:  05/08/24 6' 3 (1.905 m)    Weight:   Wt Readings from Last 1 Encounters:  05/08/24 101.8 kg    BMI:  Body mass index is 28.05 kg/m.  Estimated Nutritional Needs:   Kcal:  2400-2600  Protein:  125-135g  Fluid:  2L/day    Morna Lee, MS, RD, LDN Inpatient Clinical Dietitian Contact via Secure chat

## 2024-05-12 ENCOUNTER — Ambulatory Visit

## 2024-05-12 DIAGNOSIS — K208 Other esophagitis without bleeding: Secondary | ICD-10-CM | POA: Diagnosis not present

## 2024-05-12 DIAGNOSIS — T66XXXA Radiation sickness, unspecified, initial encounter: Secondary | ICD-10-CM | POA: Diagnosis not present

## 2024-05-12 LAB — COMPREHENSIVE METABOLIC PANEL WITH GFR
ALT: 27 U/L (ref 0–44)
AST: 27 U/L (ref 15–41)
Albumin: 3.3 g/dL — ABNORMAL LOW (ref 3.5–5.0)
Alkaline Phosphatase: 35 U/L — ABNORMAL LOW (ref 38–126)
Anion gap: 9 (ref 5–15)
BUN: 12 mg/dL (ref 8–23)
CO2: 24 mmol/L (ref 22–32)
Calcium: 8.7 mg/dL — ABNORMAL LOW (ref 8.9–10.3)
Chloride: 104 mmol/L (ref 98–111)
Creatinine, Ser: 0.84 mg/dL (ref 0.61–1.24)
GFR, Estimated: >60 mL/min (ref >60)
Glucose, Bld: 90 mg/dL (ref 70–99)
Potassium: 3.5 mmol/L (ref 3.5–5.1)
Sodium: 137 mmol/L (ref 135–145)
Total Bilirubin: 1 mg/dL (ref 0.0–1.2)
Total Protein: 6 g/dL — ABNORMAL LOW (ref 6.5–8.1)

## 2024-05-12 LAB — CBC
HCT: 34.6 % — ABNORMAL LOW (ref 39.0–52.0)
Hemoglobin: 11.7 g/dL — ABNORMAL LOW (ref 13.0–17.0)
MCH: 32.9 pg (ref 26.0–34.0)
MCHC: 33.8 g/dL (ref 30.0–36.0)
MCV: 97.2 fL (ref 80.0–100.0)
Platelets: 128 10*3/uL — ABNORMAL LOW (ref 150–400)
RBC: 3.56 MIL/uL — ABNORMAL LOW (ref 4.22–5.81)
RDW: 14 % (ref 11.5–15.5)
WBC: 2.6 10*3/uL — ABNORMAL LOW (ref 4.0–10.5)
nRBC: 0 % (ref 0.0–0.2)

## 2024-05-12 NOTE — Progress Notes (Signed)
 Mobility Specialist - Progress Note   05/12/24 0911  Mobility  Activity Ambulated independently in hallway  Level of Assistance Independent  Assistive Device None  Distance Ambulated (ft) 500 ft  Range of Motion/Exercises Active  Activity Response Tolerated well  Mobility Referral Yes  Mobility visit 1 Mobility  Mobility Specialist Start Time (ACUTE ONLY) 0911  Mobility Specialist Stop Time (ACUTE ONLY) [id number]  Mobility Specialist Time Calculation (min) (ACUTE ONLY) 12 min   Pt was found in bed and agreeable to ambulate. No complaints and returned to room with all needs met. Call bell in reach.  Erminio Leos,  Mobility Specialist Can be reached via Secure Chat

## 2024-05-12 NOTE — Progress Notes (Signed)
  Progress Note   Patient: Jackson Powers FMW:[id number] DOB: [birth year] DOA: 05/08/2024     3 DOS: the patient was seen and examined on 05/12/2024   Brief hospital course: Jackson Powers is a 78 y.o. male with medical history significant for stage IIIa NSCLC on concurrent chemoradiation, COPD, HTN, RBBB who is admitted with radiation-induced esophagitis.  Assessment and Plan: #1 radiation-induced esophagitis -Patient with significantly poor oral intake unable to maintain adequate oral intake for 7 to 10 days. - CT chest done with diffuse esophageal wall thickening most notably in the mid and lower esophagus concerning for esophagitis. - Patient currently on Carafate  which she states has not been helping much at home. -On magic mouthwash with lidocaine . - now tolerating clears. Advanced to full liquids -Dietitian following - Oncology following   2.  Chest pain -Per admitting physician patient noted to have developed persistent central chest pain after returning from CT scan. - Patient noted to have [facility] November 2021 that showed nonobstructive CAD. - CT chest with diffuse coronary disease and also findings concerning for esophagitis. - Cardiac enzymes negative. - Continue telemetry. - Nitroglycerin  as needed. - Change Toprol -XL to IV Lopressor  as patient unable to tolerate oral intake at this time. - Patient started on aspirin  81 mg daily if able to tolerate.   3.  Stage IIIa non-small cell lung cancer -Being followed by medical oncology Dr. Gatha Gatha on concurrent chemoradiation. - CT chest done with stable left hilar/medial left upper lobe soft tissue mass and stable right upper pulmonary nodule. - Oncology following   4.  COPD -Stable. - Continue Trelegy, Ellipta.   5.  Hypertension - Patient with difficulty with oral intake. - Discontinue Toprol -XL and placed on IV Lopressor  scheduled. - Hold amlodipine  for now. - IV hydralazine  as needed.   6.  History of CVA -Aspirin   if able to tolerate for secondary stroke prophylaxis. - Not on a statin prior to admission.   7.  Dehydration -IV fluids.  8. Moderate protein calorie malnutrition -Dietician following -If pt is unable to tolerate PO, then ultimately may need tube feeding      Subjective: Tolerating clears this AM. Willing to try full liquid  Physical Exam: Vitals:   05/11/24 1319 05/11/24 2013 05/12/24 0426 05/12/24 1230  BP: 136/80 (!) 169/81 (!) 159/90 139/74  Pulse: 61 80 77 68  Resp: 20 20 20 20   Temp: 98.3 F (36.8 C) 98.3 F (36.8 C) 98.7 F (37.1 C) 98.3 F (36.8 C)  TempSrc: Oral Oral Oral Oral  SpO2: 95% 95% 94% 96%  Weight:      Height:       General exam: Awake, laying in bed, in nad Respiratory system: Normal respiratory effort, no wheezing Cardiovascular system: regular rate, s1, s2 Gastrointestinal system: Soft, nondistended, positive BS Central nervous system: CN2-12 grossly intact, strength intact Extremities: Perfused, no clubbing Skin: Normal skin turgor, no notable skin lesions seen Psychiatry: Mood normal // no visual hallucinations   Data Reviewed:  Labs reviewed: Na 137, K 3.5, Cr 0.84  Family Communication: Pt in room, family at bedside  Disposition: Status is: Inpatient Remains inpatient appropriate because: Severity of illness  Planned Discharge Destination: Home    Author: Garnette Pelt, MD 05/12/2024 5:12 PM  For on call review [email or web address].

## 2024-05-12 NOTE — Plan of Care (Signed)

## 2024-05-13 DIAGNOSIS — T66XXXA Radiation sickness, unspecified, initial encounter: Secondary | ICD-10-CM | POA: Diagnosis not present

## 2024-05-13 DIAGNOSIS — K208 Other esophagitis without bleeding: Secondary | ICD-10-CM | POA: Diagnosis not present

## 2024-05-13 NOTE — Progress Notes (Signed)
  Progress Note   Patient: Jackson Powers FMW:[id number] DOB: 11/14/1946 DOA: 05/08/2024     4 DOS: the patient was seen and examined on 05/13/2024   Brief hospital course: Jackson Powers is a 78 y.o. male with medical history significant for stage IIIa NSCLC on concurrent chemoradiation, COPD, HTN, RBBB who is admitted with radiation-induced esophagitis.  Assessment and Plan: #1 radiation-induced esophagitis -Patient with significantly poor oral intake unable to maintain adequate oral intake for 7 to 10 days. - CT chest done with diffuse esophageal wall thickening most notably in the mid and lower esophagus concerning for esophagitis. - Patient currently on Carafate  which she states has not been helping much at home. -On magic mouthwash with lidocaine . - Not really tolerating even liquid diet - Oncology following -Dietitian following. Will check calorie count over weekend. Pt understands possible need for PEG if he cannot take adequate PO nutrition   2.  Chest pain -Per admitting physician patient noted to have developed persistent central chest pain after returning from CT scan. - Patient noted to have [facility] November 2021 that showed nonobstructive CAD. - CT chest with diffuse coronary disease and also findings concerning for esophagitis. - Cardiac enzymes negative. - Continue telemetry. - Nitroglycerin  as needed. - Change Toprol -XL to IV Lopressor  as patient unable to tolerate oral intake at this time. - Patient started on aspirin  81 mg daily if able to tolerate.   3.  Stage IIIa non-small cell lung cancer -Being followed by medical oncology Dr. Gatha Gatha on concurrent chemoradiation. - CT chest done with stable left hilar/medial left upper lobe soft tissue mass and stable right upper pulmonary nodule. - Oncology following   4.  COPD -Stable. - Continue Trelegy, Ellipta.   5.  Hypertension - Patient with difficulty with oral intake. - Discontinue Toprol -XL and placed on IV  Lopressor  scheduled. - Hold amlodipine  for now. - IV hydralazine  as needed.   6.  History of CVA -Aspirin  if able to tolerate for secondary stroke prophylaxis. - Not on a statin prior to admission.   7.  Dehydration -IV fluids.  8. Moderate protein calorie malnutrition -Dietician following. Calorie count under way -If pt is unable to tolerate PO, then ultimately may need tube feeding      Subjective: Not tolerating liquids this AM  Physical Exam: Vitals:   05/12/24 1230 05/12/24 2117 05/13/24 0519 05/13/24 1322  BP: 139/74 (!) 158/74 138/76 (!) 160/83  Pulse: 68 66 70 64  Resp: 20 18 16 20   Temp: 98.3 F (36.8 C) 98.3 F (36.8 C) 98.2 F (36.8 C) 98.4 F (36.9 C)  TempSrc: Oral Oral Oral Oral  SpO2: 96% 95% 94% 95%  Weight:      Height:       General exam: Awake, laying in bed, in nad Respiratory system: Normal respiratory effort, no wheezing Cardiovascular system: regular rate, s1, s2 Gastrointestinal system: Soft, nondistended, positive BS Central nervous system: CN2-12 grossly intact, strength intact Extremities: Perfused, no clubbing Skin: Normal skin turgor, no notable skin lesions seen Psychiatry: Mood normal // no visual hallucinations   Data Reviewed:  Labs reviewed: Na 137, K 3.5, Cr 0.84, WBC 2.6, Hgb 11.7, Plts 128  Family Communication: Pt in room, family not at bedside  Disposition: Status is: Inpatient Remains inpatient appropriate because: Severity of illness  Planned Discharge Destination: Home    Author: Garnette Pelt, MD 05/13/2024 2:57 PM  For on call review [email or web address].

## 2024-05-13 NOTE — Progress Notes (Signed)
 Mobility Specialist - Progress Note   05/13/24 1128  Mobility  Activity Ambulated independently in hallway  Level of Assistance Independent  Assistive Device None  Distance Ambulated (ft) 500 ft  Activity Response Tolerated well  Mobility Referral Yes  Mobility visit 1 Mobility  Mobility Specialist Start Time (ACUTE ONLY) 1122  Mobility Specialist Stop Time (ACUTE ONLY) 1128  Mobility Specialist Time Calculation (min) (ACUTE ONLY) 6 min   Pt received in bed and agreeable to mobility. No complaints during session. Pt to bed after session with all needs met.    [facility]

## 2024-05-13 NOTE — Progress Notes (Signed)
 Nutrition Brief Note  Received consult for calorie count, pt being followed by RD, assessment completed 6/26. Made nursing aware to hang calorie count envelope outside pt's room and to document % of meal consumed on meal ticket as well as to document nay snacks/supplements he takes in. Calorie count results will be documented on Monday.   INTERVENTION:    -Continue  Ensure Plus High Protein po QID, each supplement provides 350 kcal and 20 grams of protein.    -Monitor for ability to have diet advanced  - 48 hour calorie count per MD   NUTRITION DIAGNOSIS:    Moderate Malnutrition related to acute illness as evidenced by mild fat depletion, mild muscle depletion, energy intake < 75% for > 7 days, percent weight loss.   GOAL:    Patient will meet greater than or equal to 90% of their needs   Olivia Kenning, RD Registered Dietitian  See Amion for more information

## 2024-05-13 NOTE — Plan of Care (Signed)

## 2024-05-14 DIAGNOSIS — K208 Other esophagitis without bleeding: Secondary | ICD-10-CM | POA: Diagnosis not present

## 2024-05-14 DIAGNOSIS — T66XXXA Radiation sickness, unspecified, initial encounter: Secondary | ICD-10-CM | POA: Diagnosis not present

## 2024-05-14 MED ORDER — LACTATED RINGERS IV SOLN: INTRAVENOUS | Status: DC

## 2024-05-14 NOTE — Progress Notes (Signed)
 Progress Note   Patient: Jackson Powers FMW:[id number] DOB: August 09, 1946 DOA: 05/08/2024     5 DOS: the patient was seen and examined on 05/14/2024   Brief hospital course: Jackson Powers is a 78 y.o. male with medical history significant for stage IIIa NSCLC on concurrent chemoradiation, COPD, HTN, RBBB who is admitted with radiation-induced esophagitis.  Assessment and Plan: #1 radiation-induced esophagitis -Patient with significantly poor oral intake unable to maintain adequate oral intake for 7 to 10 days. - CT chest done with diffuse esophageal wall thickening most notably in the mid and lower esophagus concerning for esophagitis. - Patient currently on Carafate  which she states has not been helping much at home. -On magic mouthwash with lidocaine . - Not really tolerating even liquid diet - Oncology following -Calorie count is underway over weekend. Pt understands possible need for PEG if he cannot take adequate PO nutrition -Given limited PO, will resume basal IVF   2.  Chest pain -Per admitting physician patient noted to have developed persistent central chest pain after returning from CT scan. - Patient noted to have [facility] November 2021 that showed nonobstructive CAD. - CT chest with diffuse coronary disease and also findings concerning for esophagitis. - Cardiac enzymes negative. - Continue telemetry. - Nitroglycerin  as needed. - Change Toprol -XL to IV Lopressor  as patient unable to tolerate oral intake at this time. - Patient started on aspirin  81 mg daily if able to tolerate.   3.  Stage IIIa non-small cell lung cancer -Being followed by medical oncology Dr. Gatha Gatha on concurrent chemoradiation. - CT chest done with stable left hilar/medial left upper lobe soft tissue mass and stable right upper pulmonary nodule. - Oncology following   4.  COPD -Stable. - Continue Trelegy, Ellipta.   5.  Hypertension - Patient with difficulty with oral intake. - Discontinue Toprol -XL  and placed on IV Lopressor  scheduled. - Hold amlodipine  for now. - IV hydralazine  as needed.   6.  History of CVA -Aspirin  if able to tolerate for secondary stroke prophylaxis. - Not on a statin prior to admission.   7.  Dehydration -IV fluids.  8. Moderate protein calorie malnutrition -Dietician following. Calorie count under way -If pt is unable to tolerate PO, then ultimately may need tube feeding      Subjective: Only taking some ensure. Not really tolerating liquid diet secondary to continued chest pains on the inside  Physical Exam: Vitals:   05/13/24 1322 05/13/24 2110 05/14/24 0531 05/14/24 1310  BP: (!) 160/83 131/66 (!) 143/69 (!) 149/70  Pulse: 64 66 68 69  Resp: 20 20 17 14   Temp: 98.4 F (36.9 C) 97.7 F (36.5 C) 98.2 F (36.8 C) 98.5 F (36.9 C)  TempSrc: Oral Oral Oral Oral  SpO2: 95% 96% 95% 93%  Weight:      Height:       General exam: Conversant, in no acute distress Respiratory system: normal chest rise, clear, no audible wheezing Cardiovascular system: regular rhythm, s1-s2 Gastrointestinal system: Nondistended, nontender, pos BS Central nervous system: No seizures, no tremors Extremities: No cyanosis, no joint deformities Skin: No rashes, no pallor Psychiatry: Affect normal // no auditory hallucinations   Data Reviewed:  There are no new results to review at this time.  Family Communication: Pt in room, family not at bedside  Disposition: Status is: Inpatient Remains inpatient appropriate because: Severity of illness  Planned Discharge Destination: Home    Author: Garnette Pelt, MD 05/14/2024 3:09 PM  For on call review [email or web address].

## 2024-05-15 ENCOUNTER — Ambulatory Visit

## 2024-05-15 DIAGNOSIS — K208 Other esophagitis without bleeding: Secondary | ICD-10-CM | POA: Diagnosis not present

## 2024-05-15 DIAGNOSIS — T66XXXA Radiation sickness, unspecified, initial encounter: Secondary | ICD-10-CM | POA: Diagnosis not present

## 2024-05-15 LAB — CBC
HCT: 36.8 % — ABNORMAL LOW (ref 39.0–52.0)
Hemoglobin: 12.3 g/dL — ABNORMAL LOW (ref 13.0–17.0)
MCH: 31.8 pg (ref 26.0–34.0)
MCHC: 33.4 g/dL (ref 30.0–36.0)
MCV: 95.1 fL (ref 80.0–100.0)
Platelets: 146 10*3/uL — ABNORMAL LOW (ref 150–400)
RBC: 3.87 MIL/uL — ABNORMAL LOW (ref 4.22–5.81)
RDW: 14.6 % (ref 11.5–15.5)
WBC: 2.4 10*3/uL — ABNORMAL LOW (ref 4.0–10.5)
nRBC: 0 % (ref 0.0–0.2)

## 2024-05-15 LAB — COMPREHENSIVE METABOLIC PANEL WITH GFR
ALT: 54 U/L — ABNORMAL HIGH (ref 0–44)
AST: 46 U/L — ABNORMAL HIGH (ref 15–41)
Albumin: 3.5 g/dL (ref 3.5–5.0)
Alkaline Phosphatase: 37 U/L — ABNORMAL LOW (ref 38–126)
Anion gap: 8 (ref 5–15)
BUN: 15 mg/dL (ref 8–23)
CO2: 26 mmol/L (ref 22–32)
Calcium: 9 mg/dL (ref 8.9–10.3)
Chloride: 104 mmol/L (ref 98–111)
Creatinine, Ser: 0.81 mg/dL (ref 0.61–1.24)
GFR, Estimated: >60 mL/min (ref >60)
Glucose, Bld: 88 mg/dL (ref 70–99)
Potassium: 3.9 mmol/L (ref 3.5–5.1)
Sodium: 138 mmol/L (ref 135–145)
Total Bilirubin: 1 mg/dL (ref 0.0–1.2)
Total Protein: 6.5 g/dL (ref 6.5–8.1)

## 2024-05-15 MED ORDER — HYDROMORPHONE HCL 1 MG/ML IJ SOLN: 0.1000 mg | INTRAMUSCULAR | Status: DC | PRN

## 2024-05-15 MED ORDER — HYDROMORPHONE HCL 1 MG/ML IJ SOLN
0.5000 mg | INTRAMUSCULAR | Status: DC | PRN
  Administered 2024-05-15: 0.5 mg via INTRAVENOUS
  Administered 2024-05-16 (×2): 1 mg via INTRAVENOUS
  Filled 2024-05-15 (×3): qty 1

## 2024-05-15 NOTE — Progress Notes (Signed)
 Patient refused to have radiation today. Dr. Cindy notified.

## 2024-05-15 NOTE — Progress Notes (Signed)
 Progress Note   Patient: Jackson Powers [id number] DOB: 07-Feb-1946 DOA: 05/08/2024     6 DOS: the patient was seen and examined on 05/15/2024   Brief hospital course: Jackson Powers is a 78 y.o. male with medical history significant for stage IIIa NSCLC on concurrent chemoradiation, COPD, HTN, RBBB who is admitted with radiation-induced esophagitis.  Assessment and Plan: #1 radiation-induced esophagitis -Patient with significantly poor oral intake unable to maintain adequate oral intake for 7 to 10 days. - CT chest done with diffuse esophageal wall thickening most notably in the mid and lower esophagus concerning for esophagitis. - Patient currently on Carafate  which she states has not been helping much at home. -On magic mouthwash with lidocaine . - Not really tolerating even liquid diet - Oncology following -Calorie count is underway over weekend. Pt understands possible need for PEG if he cannot take adequate PO nutrition -Given limited PO, pt is on basal IVF -Pt declined radiation tx this AM. States he may not want to go through rad tx in the future based on current symptoms   2.  Chest pain -Per admitting physician patient noted to have developed persistent central chest pain after returning from CT scan. - Patient noted to have [facility] November 2021 that showed nonobstructive CAD. - CT chest with diffuse coronary disease and also findings concerning for esophagitis. - Cardiac enzymes negative. - Continue telemetry. - Nitroglycerin  as needed. - Had changed Toprol -XL to IV Lopressor  as patient unable to tolerate oral intake at this time. - Patient started on aspirin  81 mg daily if able to tolerate.   3.  Stage IIIa non-small cell lung cancer -Being followed by medical oncology Dr. Gatha Jackson on concurrent chemoradiation. - CT chest done with stable left hilar/medial left upper lobe soft tissue mass and stable right upper pulmonary nodule. - Oncology following   4.   COPD -Stable. - Continue Trelegy, Ellipta.   5.  Hypertension - Patient with difficulty with oral intake. - Discontinue Toprol -XL and placed on IV Lopressor  scheduled. - Hold amlodipine  for now. - IV hydralazine  as needed.   6.  History of CVA -Aspirin  if able to tolerate for secondary stroke prophylaxis. - Not on a statin prior to admission.   7.  Dehydration -IV fluids.  8. Moderate protein calorie malnutrition -Dietician following. Calorie count remains pending -If pt is unable to take in adequate nutrition, then ultimately may need tube feeding      Subjective: Reports continued discomfort with eating, however believes it does feel better today  Physical Exam: Vitals:   05/14/24 1310 05/14/24 1840 05/15/24 0438 05/15/24 1323  BP: (!) 149/70 (!) 173/77 (!) 167/79 (!) 161/76  Pulse: 69 66 66 62  Resp: 14 16 16 17   Temp: 98.5 F (36.9 C) 97.6 F (36.4 C) 98.2 F (36.8 C) 98.4 F (36.9 C)  TempSrc: Oral Oral Oral Oral  SpO2: 93% 94% 93% 94%  Weight:  100.8 kg    Height:       General exam: Awake, laying in bed, in nad Respiratory system: Normal respiratory effort, no wheezing Cardiovascular system: regular rate, s1, s2 Gastrointestinal system: Soft, nondistended, positive BS Central nervous system: CN2-12 grossly intact, strength intact Extremities: Perfused, no clubbing Skin: Normal skin turgor, no notable skin lesions seen Psychiatry: Mood normal // no visual hallucinations   Data Reviewed:  Labs reviewed: Na 138, K 3.9, Cr 0.81, WBC 2.4, Hgb 12.3, Plts 146  Family Communication: Pt in room, family not at bedside  Disposition: Status  is: Inpatient Remains inpatient appropriate because: Severity of illness  Planned Discharge Destination: Home    Author: Garnette Pelt, MD 05/15/2024 5:42 PM  For on call review [email or web address].

## 2024-05-15 NOTE — Progress Notes (Signed)
 Calorie Count Note  48 hour calorie count ordered.  Diet: Full liquids Supplements:  -Ensure Plus High Protein po QID, each supplement provides 350 kcal and 20 grams of protein.   6/28 Lunch: 290 kcals, 5g protein Dinner: not ordered 6/29 Breakfast: not ordered Supplements: 2 Ensures = 700 kcals, 40g protein  Total intake: 990 kcal (41% of minimum estimated needs)  45g protein (36% of minimum estimated needs)  6/29 Lunch: 40 kcals Dinner: not ordered 6/30 Breakfast: not ordered Supplements: 1/2 Ensure = 175 kcals, 10g protein  Total intake: 215 kcal (8% of minimum estimated needs)  10g protein (8% of minimum estimated needs)  Spoke with patient at bedside, reports he didn't know he was supposed to order meals so he hasn't been. Missed breakfast this morning. RD placed order for lunch tray today. Encouraged pt to call and place orders going forward. Encouraged Ensure even though he is getting tired of it. Discussed that if he does not eat more, feeding tube will be recommended. Pt doesn't want tube so seems more motivated to eat today. Didn't have a good day yesterday with eating so will continue Cal count another 24 hours. Pt less sensitive to food temps so will trial some ice cream and chilled Ensure.   Nutrition Dx: Moderate Malnutrition related to acute illness as evidenced by mild fat depletion, mild muscle depletion, energy intake < 75% for > 7 days, percent weight loss.   Goal: Pt to meet >/= 90% of their estimated nutrition needs   Intervention:  -Continue Calorie count another 24 hours -Continue Ensure QID -+Magic cup TID with meals, each supplement provides 290 kcal and 9 grams of protein  -Encourage PO intake -PEG if unable to meet needs  Jackson Lee, MS, RD, LDN Inpatient Clinical Dietitian Contact via Secure chat

## 2024-05-15 NOTE — Progress Notes (Signed)
 Mobility Specialist - Progress Note   05/15/24 1000  Mobility  Activity Ambulated with assistance in hallway  Level of Assistance Independent after set-up  Assistive Device Other (Comment) (IV pole)  Distance Ambulated (ft) 600 ft  Range of Motion/Exercises Active  Activity Response Tolerated well  Mobility Referral Yes  Mobility visit 1 Mobility  Mobility Specialist Start Time (ACUTE ONLY) 1004  Mobility Specialist Stop Time (ACUTE ONLY) 1011  Mobility Specialist Time Calculation (min) (ACUTE ONLY) 7 min   Received in bed and agreed to mobility. Had no issues throughout session, returned to bed with all needs met.  Cyndee Ada Mobility Specialist

## 2024-05-15 NOTE — Plan of Care (Signed)

## 2024-05-15 NOTE — Plan of Care (Signed)
  Problem: Education: Goal: Knowledge of General Education information will improve Description: Including pain rating scale, medication(s)/side effects and non-pharmacologic comfort measures Outcome: Progressing   Problem: Health Behavior/Discharge Planning: Goal: Ability to manage health-related needs will improve Outcome: Progressing   Problem: Clinical Measurements: Goal: Ability to maintain clinical measurements within normal limits will improve Outcome: Progressing Goal: Will remain free from infection Outcome: Progressing Goal: Diagnostic test results will improve Outcome: Progressing Goal: Respiratory complications will improve Outcome: Progressing   Problem: Activity: Goal: Risk for activity intolerance will decrease Outcome: Progressing   Problem: Nutrition: Goal: Adequate nutrition will be maintained Outcome: Progressing   Problem: Elimination: Goal: Will not experience complications related to bowel motility Outcome: Progressing   Problem: Pain Managment: Goal: General experience of comfort will improve and/or be controlled Outcome: Progressing   Problem: Safety: Goal: Ability to remain free from injury will improve Outcome: Progressing

## 2024-05-16 ENCOUNTER — Inpatient Hospital Stay: Admitting: Internal Medicine

## 2024-05-16 ENCOUNTER — Inpatient Hospital Stay: Attending: Physician Assistant

## 2024-05-16 ENCOUNTER — Other Ambulatory Visit: Payer: Self-pay

## 2024-05-16 ENCOUNTER — Ambulatory Visit

## 2024-05-16 ENCOUNTER — Inpatient Hospital Stay

## 2024-05-16 DIAGNOSIS — T66XXXA Radiation sickness, unspecified, initial encounter: Secondary | ICD-10-CM | POA: Diagnosis not present

## 2024-05-16 DIAGNOSIS — K208 Other esophagitis without bleeding: Secondary | ICD-10-CM | POA: Diagnosis not present

## 2024-05-16 MED ORDER — MORPHINE SULFATE (PF) 2 MG/ML IV SOLN
1.0000 mg | INTRAVENOUS | Status: DC | PRN
  Administered 2024-05-16 – 2024-05-19 (×12): 1 mg via INTRAVENOUS
  Filled 2024-05-16 (×12): qty 1

## 2024-05-16 NOTE — Progress Notes (Signed)
 Nutrition Follow-up  DOCUMENTATION CODES:   Non-severe (moderate) malnutrition in context of acute illness/injury  INTERVENTION:   Monitor magnesium, potassium, and phosphorus for at least 3 days, MD to replete as needed, as pt is at risk for refeeding syndrome. -Recommend 100 mg Thiamine daily for at least 5 days.  Once PEG placed and ready to use: -Initiate Osmolite 1.5 @ 20 ml/hr, advance by 10 ml every 8 hours to goal rate of 70 ml/hr. -60 ml Prosource TF 20 daily -Provides 2600 kcals, 125g protein and 1280 ml H2O  Bolus-gravity recommendations: -7 cartons of Osmolite 1.5 daily (1.5 cartons QID + 1 carton daily) -Free water flush: 60 ml before and after each feeding -Additional water via tube or PO: 120 ml QID  -Calorie Count completed- not meeting needs  NUTRITION DIAGNOSIS:   Moderate Malnutrition related to acute illness as evidenced by mild fat depletion, mild muscle depletion, energy intake < 75% for > 7 days, percent weight loss.  Ongoing.  GOAL:   Patient will meet greater than or equal to 90% of their needs  Not meeting.  MONITOR:   PO intake, Supplement acceptance, TF tolerance  ASSESSMENT:   77 y.o. male with medical history significant for stage IIIa NSCLC on concurrent chemoradiation, COPD, HTN, RBBB who is admitted with radiation-induced esophagitis.  Patient in room, reports he hasn't eaten anything d/t pain. Reports sensitivity to food temps again. Drank 1/3 of an Ensure yesterday. Spoke with pt about PEG, has come to terms that he needs a tube. Discussed tube feeds with patient. Will provide more guidance once tube is placed. Will start continuous regimen initially and then transition to bolus gravity regimen for discharge.  Pt will be at refeeding risk given poor POs for 2 weeks.   Admission weight: 232 lbs Current weight: 222 lbs  Medications: Magic mouthwash w/ lidocaine , Carafate   Labs reviewed.  Diet Order:   Diet Order              Diet full liquid Room service appropriate? Yes; Fluid consistency: Thin  Diet effective now                   EDUCATION NEEDS:   Not appropriate for education at this time  Skin:  Skin Assessment: Reviewed RN Assessment  Last BM:  6/26  Height:   Ht Readings from Last 1 Encounters:  05/08/24 6' 3 (1.905 m)    Weight:   Wt Readings from Last 1 Encounters:  05/14/24 100.8 kg    BMI:  Body mass index is 27.78 kg/m.  Estimated Nutritional Needs:   Kcal:  2400-2600  Protein:  125-135g  Fluid:  2L/day  Morna Lee, MS, RD, LDN Inpatient Clinical Dietitian Contact via Secure chat

## 2024-05-16 NOTE — Progress Notes (Signed)
 Progress Note   Patient: Jackson Powers FMW:[id number] DOB: 1946-08-03 DOA: 05/08/2024     7 DOS: the patient was seen and examined on 05/16/2024   Brief hospital course: Jackson Powers is a 78 y.o. male with medical history significant for stage IIIa NSCLC on concurrent chemoradiation, COPD, HTN, RBBB who is admitted with radiation-induced esophagitis.  Assessment and Plan: #1 radiation-induced esophagitis -Patient with significantly poor oral intake unable to maintain adequate oral intake for 7 to 10 days. - CT chest done with diffuse esophageal wall thickening most notably in the mid and lower esophagus concerning for esophagitis. - despite carafate  and magic mouthwash w/ lidocaine , pt is still unable to tolerate even clears -Not meeting needs on calorie count. PEG recommended. Pt is agreeable to PEG tube -IR consulted for PEG placement -Of note, pt has been declining further radiation tx during this admission. States he may not want to go through rad tx in the future based on current symptoms   2.  Chest pain -Per admitting physician patient noted to have developed persistent central chest pain after returning from CT scan. - Patient noted to have [facility] November 2021 that showed nonobstructive CAD. - CT chest with diffuse coronary disease and also findings concerning for esophagitis. - Cardiac enzymes negative. - Continue telemetry. - Nitroglycerin  as needed. - continue IV Lopressor  as patient unable to tolerate oral intake at this time. - Patient started on aspirin  81 mg daily if able to tolerate.   3.  Stage IIIa non-small cell lung cancer -Being followed by medical oncology Dr. Gatha Jackson on concurrent chemoradiation. - CT chest done with stable left hilar/medial left upper lobe soft tissue mass and stable right upper pulmonary nodule. - Oncology following   4.  COPD -Stable. - Continue Trelegy, Ellipta.   5.  Hypertension - Patient with difficulty with oral intake. -  Discontinued Toprol -XL and continue IV Lopressor  scheduled. - Hold amlodipine  for now. - IV hydralazine  as needed.   6.  History of CVA -Aspirin  if able to tolerate for secondary stroke prophylaxis when pt can take. - Not on a statin prior to admission.   7.  Dehydration -IV fluids.  8. Moderate protein calorie malnutrition -Now s/p calorie count. Pt is not meeting needs -PEG recommended for supplemental tube feeding. Pt agrees with this -Dietitian following      Subjective: Still not tolerating even clear liquids this AM and now starting to feel weaker. Pt is agree able to PEG  Physical Exam: Vitals:   05/15/24 1323 05/15/24 1941 05/16/24 0516 05/16/24 1233  BP: (!) 161/76 (!) 158/81 (!) 145/75 (!) 160/78  Pulse: 62 60 (!) 56 (!) 59  Resp: 17 16 17 16   Temp: 98.4 F (36.9 C) 98.5 F (36.9 C) 97.9 F (36.6 C) 98.1 F (36.7 C)  TempSrc: Oral Oral Oral Oral  SpO2: 94% 94% 93% 95%  Weight:      Height:       General exam: Conversant, in no acute distress Respiratory system: normal chest rise, clear, no audible wheezing Cardiovascular system: regular rhythm, s1-s2 Gastrointestinal system: Nondistended, nontender, pos BS Central nervous system: No seizures, no tremors Extremities: No cyanosis, no joint deformities Skin: No rashes, no pallor Psychiatry: Affect normal // no auditory hallucinations   Data Reviewed:  There are no new results to review at this time.  Family Communication: Pt in room, family not at bedside  Disposition: Status is: Inpatient Remains inpatient appropriate because: Severity of illness  Planned Discharge Destination: Home  Author: Garnette Pelt, MD 05/16/2024 5:40 PM  For on call review [email or web address].

## 2024-05-17 ENCOUNTER — Encounter (HOSPITAL_COMMUNITY): Payer: Self-pay | Admitting: Internal Medicine

## 2024-05-17 ENCOUNTER — Other Ambulatory Visit: Payer: Self-pay

## 2024-05-17 ENCOUNTER — Ambulatory Visit

## 2024-05-17 DIAGNOSIS — K208 Other esophagitis without bleeding: Secondary | ICD-10-CM | POA: Diagnosis not present

## 2024-05-17 DIAGNOSIS — T66XXXA Radiation sickness, unspecified, initial encounter: Secondary | ICD-10-CM | POA: Diagnosis not present

## 2024-05-17 LAB — COMPREHENSIVE METABOLIC PANEL WITH GFR
ALT: 49 U/L — ABNORMAL HIGH (ref 0–44)
AST: 39 U/L (ref 15–41)
Albumin: 3.5 g/dL (ref 3.5–5.0)
Alkaline Phosphatase: 38 U/L (ref 38–126)
Anion gap: 8 (ref 5–15)
BUN: 11 mg/dL (ref 8–23)
CO2: 27 mmol/L (ref 22–32)
Calcium: 8.9 mg/dL (ref 8.9–10.3)
Chloride: 101 mmol/L (ref 98–111)
Creatinine, Ser: 0.69 mg/dL (ref 0.61–1.24)
GFR, Estimated: >60 mL/min (ref >60)
Glucose, Bld: 93 mg/dL (ref 70–99)
Potassium: 4 mmol/L (ref 3.5–5.1)
Sodium: 136 mmol/L (ref 135–145)
Total Bilirubin: 1 mg/dL (ref 0.0–1.2)
Total Protein: 6.4 g/dL — ABNORMAL LOW (ref 6.5–8.1)

## 2024-05-17 LAB — CBC
HCT: 36.3 % — ABNORMAL LOW (ref 39.0–52.0)
Hemoglobin: 12.5 g/dL — ABNORMAL LOW (ref 13.0–17.0)
MCH: 32.4 pg (ref 26.0–34.0)
MCHC: 34.4 g/dL (ref 30.0–36.0)
MCV: 94 fL (ref 80.0–100.0)
Platelets: 143 10*3/uL — ABNORMAL LOW (ref 150–400)
RBC: 3.86 MIL/uL — ABNORMAL LOW (ref 4.22–5.81)
RDW: 14.7 % (ref 11.5–15.5)
WBC: 2.7 10*3/uL — ABNORMAL LOW (ref 4.0–10.5)
nRBC: 0 % (ref 0.0–0.2)

## 2024-05-17 NOTE — Progress Notes (Signed)
 PROGRESS NOTE    Jackson Powers  FMW:[id number] DOB: 1946/07/26 DOA: 05/08/2024 PCP: Montey Lot, PA-C   Brief Narrative:  This 78 yrs old male with medical history significant for stage IIIa NSCLC on concurrent chemoradiation, COPD, HTN, RBBB who is admitted with radiation-induced esophagitis.  Patient is scheduled to have PEG tube insertion by IR today.  Assessment & Plan:   Principal Problem:   Radiation-induced esophagitis Active Problems:   Chest pain   Lung cancer (HCC)   Essential hypertension   COPD (chronic obstructive pulmonary disease) (HCC)   Dehydration   Malnutrition of moderate degree  Radiation induced esophagitis: Patient with significantly poor oral intake,  unable to maintain adequate oral intake for 7 to 10 days. CT chest done with diffuse esophageal wall thickening most notably in the mid and lower esophagus concerning for esophagitis. Despite carafate  and magic mouthwash w/ lidocaine , Patient is still unable to tolerate even clear liquid diet. Not meeting needs on calorie count. PEG recommended. Patient is agreeable to PEG tube. IR consulted for PEG placement, tentatively scheduled for 05/17/2024 Of note, Patient has been declining further radiation tx during this admission.  States he may not want to go through rad tx in the future based on current symptoms.   Chest pain > Improving Patient reports persistent central chest pain after returning from CT scan. Patient noted to have LHC in  November 2021 that showed nonobstructive CAD. CT chest with diffuse coronary disease and also findings concerning for esophagitis. Cardiac enzymes negative. Continue telemetry. Continue Nitroglycerin  as needed. Continue IV Lopressor  as patient unable to tolerate oral intake at this time. Patient started on aspirin  81 mg daily if able to tolerate.   Stage IIIa non-small cell lung cancer: He is being followed by medical oncology Dr. Gatha Gatha on concurrent  chemoradiation. CT chest done with stable left hilar/medial left upper lobe soft tissue mass and stable right upper pulmonary nodule. Oncology following   COPD: Appears stable without exacerbation. Continue Trelegy, Ellipta.   Hypertension: Patient with difficulty with oral intake. Discontinued Toprol -XL and continue IV Lopressor  scheduled. Hold amlodipine  for now. Continue IV hydralazine  as needed.   History of CVA: Aspirin  if able to tolerate for secondary stroke prophylaxis when pt can take. Not on a statin prior to admission.  Dehydration: Continue IV fluids.   Moderate protein calorie malnutrition Now s/p calorie count. Pt is not meeting needs. PEG recommended for supplemental tube feeding. Pt agrees with this Dietitian following    DVT prophylaxis: Lovenox  Code Status: Full code Family Communication: No Family at bed side Disposition Plan:    Status is: Inpatient Remains inpatient appropriate because: Severity of illness    Consultants:  IR  Procedures: scheduled PEG tube insertion 7/2 Antimicrobials: Anti-infectives (From admission, onward)    None      Subjective: Patient seen and examined at bedside.  Overnight events noted. Patient is scheduled to have PEG tube by IR today.  He states he does not want further radiation treatment.  Objective: Vitals:   05/16/24 0516 05/16/24 1233 05/16/24 2112 05/17/24 0449  BP: (!) 145/75 (!) 160/78 (!) 168/76 (!) 143/72  Pulse: (!) 56 (!) 59 (!) 59 62  Resp: 17 16 14 14   Temp: 97.9 F (36.6 C) 98.1 F (36.7 C) 98.4 F (36.9 C) 97.8 F (36.6 C)  TempSrc: Oral Oral Oral Oral  SpO2: 93% 95% 95% 95%  Weight:      Height:        Intake/Output Summary (Last  24 hours) at 05/17/2024 1100 Last data filed at 05/17/2024 0855 Gross per 24 hour  Intake 3 ml  Output --  Net 3 ml   Filed Weights   05/08/24 1525 05/08/24 2135 05/14/24 1840  Weight: 105.6 kg 101.8 kg 100.8 kg    Examination:  General exam:  Appears calm and comfortable, deconditioned, not in any acute distress. Respiratory system: CTA Bilaterally. Respiratory effort normal.  RR 16 Cardiovascular system: S1 & S2 heard, RRR. No JVD, murmurs, rubs, gallops or clicks.  Gastrointestinal system: Abdomen is non distended, soft and non tender. Normal bowel sounds heard. Central nervous system: Alert and oriented x 3. No focal neurological deficits. Extremities: No edema, no cyanosis, no clubbing. Skin: No rashes, lesions or ulcers Psychiatry: Judgement and insight appear normal. Mood & affect appropriate.     Data Reviewed: I have personally reviewed following labs and imaging studies  CBC: Recent Labs  Lab 05/11/24 0500 05/12/24 0450 05/15/24 0658 05/17/24 0617  WBC 2.5* 2.6* 2.4* 2.7*  HGB 11.8* 11.7* 12.3* 12.5*  HCT 34.7* 34.6* 36.8* 36.3*  MCV 96.4 97.2 95.1 94.0  PLT 131* 128* 146* 143*   Basic Metabolic Panel: Recent Labs  Lab 05/11/24 0500 05/12/24 0450 05/15/24 0658 05/17/24 0617  NA 138 137 138 136  K 3.5 3.5 3.9 4.0  CL 105 104 104 101  CO2 25 24 26 27   GLUCOSE 92 90 88 93  BUN 12 12 15 11   CREATININE 0.70 0.84 0.81 0.69  CALCIUM  8.7* 8.7* 9.0 8.9   GFR: Estimated Creatinine Clearance: 89.5 mL/min (by C-G formula based on SCr of 0.69 mg/dL). Liver Function Tests: Recent Labs  Lab 05/11/24 0500 05/12/24 0450 05/15/24 0658 05/17/24 0617  AST 20 27 46* 39  ALT 21 27 54* 49*  ALKPHOS 35* 35* 37* 38  BILITOT 0.7 1.0 1.0 1.0  PROT 6.0* 6.0* 6.5 6.4*  ALBUMIN 3.1* 3.3* 3.5 3.5   No results for input(s): LIPASE, AMYLASE in the last 168 hours. No results for input(s): AMMONIA in the last 168 hours. Coagulation Profile: No results for input(s): INR, PROTIME in the last 168 hours. Cardiac Enzymes: No results for input(s): CKTOTAL, CKMB, CKMBINDEX, TROPONINI in the last 168 hours. BNP (last 3 results) No results for input(s): PROBNP in the last 8760 hours. HbA1C: No results  for input(s): HGBA1C in the last 72 hours. CBG: No results for input(s): GLUCAP in the last 168 hours. Lipid Profile: No results for input(s): CHOL, HDL, LDLCALC, TRIG, CHOLHDL, LDLDIRECT in the last 72 hours. Thyroid  Function Tests: No results for input(s): TSH, T4TOTAL, FREET4, T3FREE, THYROIDAB in the last 72 hours. Anemia Panel: No results for input(s): VITAMINB12, FOLATE, FERRITIN, TIBC, IRON, RETICCTPCT in the last 72 hours. Sepsis Labs: No results for input(s): PROCALCITON, LATICACIDVEN in the last 168 hours.  No results found for this or any previous visit (from the past 240 hours).   Radiology Studies: No results found.  Scheduled Meds:  aspirin  EC  81 mg Oral Daily   enoxaparin  (LOVENOX ) injection  40 mg Subcutaneous Q24H   feeding supplement  237 mL Oral QID   magic mouthwash w/lidocaine   15 mL Oral QID   metoprolol  tartrate  5 mg Intravenous Q8H   pantoprazole  (PROTONIX ) IV  40 mg Intravenous Q12H   sodium chloride  flush  3 mL Intravenous Q12H   sucralfate   1 g Oral TID AC & HS   Continuous Infusions:  lactated ringers  75 mL/hr at 05/16/24 2128  LOS: 8 days    Time spent: 50 mins    Darcel Dawley, MD [facility]   If 7PM-7AM, please contact night-coverage

## 2024-05-17 NOTE — Progress Notes (Signed)
 Mobility Specialist - Progress Note   05/17/24 1127  Mobility  Activity Ambulated independently in hallway  Level of Assistance Independent  Assistive Device None  Distance Ambulated (ft) 500 ft  Activity Response Tolerated well  Mobility Referral Yes  Mobility visit 1 Mobility  Mobility Specialist Start Time (ACUTE ONLY) 1117  Mobility Specialist Stop Time (ACUTE ONLY) 1126  Mobility Specialist Time Calculation (min) (ACUTE ONLY) 9 min   Pt received in bed and agreeable to mobility. No complaints during session. Pt to bed after session with all needs met.    [facility]

## 2024-05-17 NOTE — Plan of Care (Signed)

## 2024-05-17 NOTE — Plan of Care (Signed)

## 2024-05-18 ENCOUNTER — Other Ambulatory Visit: Payer: Self-pay

## 2024-05-18 ENCOUNTER — Ambulatory Visit

## 2024-05-18 DIAGNOSIS — K208 Other esophagitis without bleeding: Secondary | ICD-10-CM | POA: Diagnosis not present

## 2024-05-18 DIAGNOSIS — T66XXXA Radiation sickness, unspecified, initial encounter: Secondary | ICD-10-CM | POA: Diagnosis not present

## 2024-05-18 LAB — CBC
HCT: 37.2 % — ABNORMAL LOW (ref 39.0–52.0)
Hemoglobin: 12.7 g/dL — ABNORMAL LOW (ref 13.0–17.0)
MCH: 31.9 pg (ref 26.0–34.0)
MCHC: 34.1 g/dL (ref 30.0–36.0)
MCV: 93.5 fL (ref 80.0–100.0)
Platelets: 155 10*3/uL (ref 150–400)
RBC: 3.98 MIL/uL — ABNORMAL LOW (ref 4.22–5.81)
RDW: 14.8 % (ref 11.5–15.5)
WBC: 2.9 10*3/uL — ABNORMAL LOW (ref 4.0–10.5)
nRBC: 0 % (ref 0.0–0.2)

## 2024-05-18 NOTE — Plan of Care (Signed)

## 2024-05-18 NOTE — Progress Notes (Signed)
 Mobility Specialist - Progress Note   05/18/24 1251  Mobility  Activity Ambulated independently in hallway  Level of Assistance Independent  Assistive Device None  Distance Ambulated (ft) 500 ft  Activity Response Tolerated well  Mobility Referral Yes  Mobility visit 1 Mobility  Mobility Specialist Start Time (ACUTE ONLY) 1244  Mobility Specialist Stop Time (ACUTE ONLY) 1250  Mobility Specialist Time Calculation (min) (ACUTE ONLY) 6 min   Pt received in bed and agreeable to mobility. No complaints during session. Pt to bed after session with all needs met.    [facility]

## 2024-05-18 NOTE — Progress Notes (Signed)
 PROGRESS NOTE    Jackson Powers  FMW:[id number] DOB: Jun 18, 1946 DOA: 05/08/2024 PCP: Montey Lot, PA-C   Brief Narrative:  This 78 yrs old male with medical history significant for stage IIIa NSCLC on concurrent chemoradiation, COPD, HTN, RBBB who is admitted with radiation-induced esophagitis.  Patient is scheduled to have PEG tube insertion by IR today.  Assessment & Plan:   Principal Problem:   Radiation-induced esophagitis Active Problems:   Chest pain   Lung cancer (HCC)   Essential hypertension   COPD (chronic obstructive pulmonary disease) (HCC)   Dehydration   Malnutrition of moderate degree  Radiation induced esophagitis: Patient with significantly poor oral intake,  unable to maintain adequate oral intake for 7 to 10 days. CT chest done with diffuse esophageal wall thickening most notably in the mid and lower esophagus concerning for esophagitis. Despite carafate  and magic mouthwash w/ lidocaine , Patient is still unable to tolerate even clear liquid diet. Not meeting needs on calorie count. PEG recommended. Patient is agreeable to PEG tube. IR consulted for PEG placement, tentatively scheduled for 05/17/2024 Of note, Patient has been declining further radiation tx during this admission.  States he may not want to go through rad tx in the future based on current symptoms. Since patient was taking aspirin ,  IR has scheduled for PEG tube on 05/22/2024. Discontinue aspirin  for anticipated PEG tube insertion. Needs to discontinue aspirin  for 7 days   Chest pain > Improving Patient reports persistent central chest pain after returning from CT scan. Patient noted to have LHC in  November 2021 that showed nonobstructive CAD. CT chest with diffuse coronary disease and also findings concerning for esophagitis. Cardiac enzymes negative. Continue telemetry. Continue Nitroglycerin  as needed. Continue IV Lopressor  as patient unable to tolerate oral intake at this time. Aspirin  held  for anticipated PEG tube.   Stage IIIa non-small cell lung cancer: He is being followed by medical oncology Dr. Gatha Gatha on concurrent chemoradiation. CT chest done with stable left hilar/medial left upper lobe soft tissue mass and stable right upper pulmonary nodule. Oncology following.   COPD: Appears stable without exacerbation. Continue Trelegy, Ellipta.   Hypertension: Patient with difficulty with oral intake. Discontinued Toprol -XL and continue IV Lopressor  scheduled. Hold amlodipine  for now. Continue IV hydralazine  as needed.   History of CVA: Aspirin  if able to tolerate for secondary stroke prophylaxis when pt can take. Not on a statin prior to admission.  Dehydration: Continue IV fluids.   Moderate protein calorie malnutrition Now s/p calorie count. Pt is not meeting needs. PEG recommended for supplemental tube feeding. Pt agrees with this Dietitian following    DVT prophylaxis: Lovenox  Code Status: Full code Family Communication: No Family at bed side Disposition Plan:    Status is: Inpatient Remains inpatient appropriate because: Severity of illness    Consultants:  IR  Procedures: scheduled PEG tube insertion 7/2 Antimicrobials: Anti-infectives (From admission, onward)    None      Subjective: Patient seen and examined at bedside.  Overnight events noted. Patient was supposed to have PEG tube yesterday but not inserted given patient was on aspirin .   He states he does not want further radiation treatment.  Objective: Vitals:   05/17/24 0449 05/17/24 1350 05/17/24 2130 05/18/24 0545  BP: (!) 143/72 (!) 171/91 (!) 142/79 (!) 165/77  Pulse: 62 75 (!) 58 (!) 55  Resp: 14 16 14 14   Temp: 97.8 F (36.6 C) 97.9 F (36.6 C) 98.2 F (36.8 C) 98.2 F (36.8 C)  TempSrc: Oral Oral Oral Oral  SpO2: 95% 94% 95% 95%  Weight:      Height:        Intake/Output Summary (Last 24 hours) at 05/18/2024 1300 Last data filed at 05/17/2024 1350 Gross  per 24 hour  Intake 240 ml  Output --  Net 240 ml   Filed Weights   05/08/24 1525 05/08/24 2135 05/14/24 1840  Weight: 105.6 kg 101.8 kg 100.8 kg    Examination:  General exam: Appears calm and comfortable, deconditioned, not in any acute distress. Respiratory system: CTA Bilaterally. Respiratory effort normal.  RR 14 Cardiovascular system: S1 & S2 heard, RRR. No JVD, murmurs, rubs, gallops or clicks.  Gastrointestinal system: Abdomen is non distended, soft and non tender. Normal bowel sounds heard. Central nervous system: Alert and oriented x 3. No focal neurological deficits. Extremities: No edema, no cyanosis, no clubbing. Skin: No rashes, lesions or ulcers Psychiatry: Judgement and insight appear normal. Mood & affect appropriate.     Data Reviewed: I have personally reviewed following labs and imaging studies  CBC: Recent Labs  Lab 05/12/24 0450 05/15/24 0658 05/17/24 0617 05/18/24 0536  WBC 2.6* 2.4* 2.7* 2.9*  HGB 11.7* 12.3* 12.5* 12.7*  HCT 34.6* 36.8* 36.3* 37.2*  MCV 97.2 95.1 94.0 93.5  PLT 128* 146* 143* 155   Basic Metabolic Panel: Recent Labs  Lab 05/12/24 0450 05/15/24 0658 05/17/24 0617  NA 137 138 136  K 3.5 3.9 4.0  CL 104 104 101  CO2 24 26 27   GLUCOSE 90 88 93  BUN 12 15 11   CREATININE 0.84 0.81 0.69  CALCIUM  8.7* 9.0 8.9   GFR: Estimated Creatinine Clearance: 89.5 mL/min (by C-G formula based on SCr of 0.69 mg/dL). Liver Function Tests: Recent Labs  Lab 05/12/24 0450 05/15/24 0658 05/17/24 0617  AST 27 46* 39  ALT 27 54* 49*  ALKPHOS 35* 37* 38  BILITOT 1.0 1.0 1.0  PROT 6.0* 6.5 6.4*  ALBUMIN 3.3* 3.5 3.5   No results for input(s): LIPASE, AMYLASE in the last 168 hours. No results for input(s): AMMONIA in the last 168 hours. Coagulation Profile: No results for input(s): INR, PROTIME in the last 168 hours. Cardiac Enzymes: No results for input(s): CKTOTAL, CKMB, CKMBINDEX, TROPONINI in the last 168  hours. BNP (last 3 results) No results for input(s): PROBNP in the last 8760 hours. HbA1C: No results for input(s): HGBA1C in the last 72 hours. CBG: No results for input(s): GLUCAP in the last 168 hours. Lipid Profile: No results for input(s): CHOL, HDL, LDLCALC, TRIG, CHOLHDL, LDLDIRECT in the last 72 hours. Thyroid  Function Tests: No results for input(s): TSH, T4TOTAL, FREET4, T3FREE, THYROIDAB in the last 72 hours. Anemia Panel: No results for input(s): VITAMINB12, FOLATE, FERRITIN, TIBC, IRON, RETICCTPCT in the last 72 hours. Sepsis Labs: No results for input(s): PROCALCITON, LATICACIDVEN in the last 168 hours.  No results found for this or any previous visit (from the past 240 hours).   Radiology Studies: No results found.  Scheduled Meds:  enoxaparin  (LOVENOX ) injection  40 mg Subcutaneous Q24H   feeding supplement  237 mL Oral QID   magic mouthwash w/lidocaine   15 mL Oral QID   metoprolol  tartrate  5 mg Intravenous Q8H   pantoprazole  (PROTONIX ) IV  40 mg Intravenous Q12H   sodium chloride  flush  3 mL Intravenous Q12H   sucralfate   1 g Oral TID AC & HS   Continuous Infusions:  lactated ringers  75 mL/hr at 05/16/24 2128  LOS: 9 days    Time spent: 35 mins    Darcel Dawley, MD [facility]   If 7PM-7AM, please contact night-coverage

## 2024-05-18 NOTE — Progress Notes (Signed)
 Assumed care of pt from Renita, Charity fundraiser.

## 2024-05-19 DIAGNOSIS — K208 Other esophagitis without bleeding: Secondary | ICD-10-CM | POA: Diagnosis not present

## 2024-05-19 DIAGNOSIS — T66XXXA Radiation sickness, unspecified, initial encounter: Secondary | ICD-10-CM | POA: Diagnosis not present

## 2024-05-19 MED ORDER — NYSTATIN 100000 UNIT/ML MT SUSP
Freq: Four times a day (QID) | OROMUCOSAL | 0 refills | Status: AC
  Filled 2024-05-19: qty 600, 10d supply, fill #0

## 2024-05-19 MED ORDER — OXYCODONE-ACETAMINOPHEN 5-325 MG PO TABS
1.0000 | ORAL_TABLET | ORAL | 0 refills | Status: DC | PRN
  Filled 2024-05-19: qty 9, 2d supply, fill #0

## 2024-05-19 MED ORDER — LIDOCAINE VISCOUS HCL 2 % MT SOLN
15.0000 mL | OROMUCOSAL | 0 refills | Status: AC | PRN
  Filled 2024-05-19: qty 20, 1d supply, fill #0

## 2024-05-19 MED ORDER — OXYCODONE-ACETAMINOPHEN 5-325 MG PO TABS: 1.0000 | ORAL_TABLET | ORAL | 0 refills | Status: AC | PRN

## 2024-05-19 NOTE — Plan of Care (Signed)

## 2024-05-19 NOTE — Discharge Instructions (Signed)
 Advised to follow-up with primary care physician in 1 week. Advised to discontinue aspirin  until patient have a PEG tube placement on 05/22/2024 Advised to follow-up with IR on Monday for PEG tube insertion

## 2024-05-19 NOTE — Discharge Summary (Signed)
 Physician Discharge Summary  Jackson Powers FMW:[id number] DOB: 1946/02/02 DOA: 05/08/2024  PCP: Montey Lot, PA-C  Admit date: 05/08/2024  Discharge date: 05/19/2024  Admitted From: Home.  Disposition:  Home.  Recommendations for Outpatient Follow-up:  Follow up with PCP in 1-2 weeks. Please obtain BMP/CBC in one week. Advised to discontinue aspirin  x 7 days until patient have a PEG tube placement on 05/22/2024 Advised to follow-up with IR on Monday for PEG tube insertion.  Home Health: None Equipment/Devices:None  Discharge Condition: Stable CODE STATUS:Full code Diet recommendation: Clear liquid diet  Brief Summary/ Hospital Course: This 78 yrs old male with medical history significant for stage IIIa NSCLC on concurrent chemoradiation, COPD, HTN, RBBB who is admitted with radiation-induced esophagitis. Patient was kept NPO.  Patient was scheduled to have PEG tube insertion by IR but patient was on Plavix . IR states Plavix  and aspirin  has to be discontinued for 7 days before PEG tube can be inserted. Patient was tentatively scheduled for PEG tube insertion by IR on Monday, 05/22/2024.  IR states patient can be discharged and can follow-up on Monday for PEG tube insertion.  Patient agreed with the discharge plan, Patient tolerated clear liquid diet.  Patient is being discharged home.  Discharge Diagnoses:  Principal Problem:   Radiation-induced esophagitis Active Problems:   Chest pain   Lung cancer (HCC)   Essential hypertension   COPD (chronic obstructive pulmonary disease) (HCC)   Dehydration   Malnutrition of moderate degree  Radiation induced esophagitis: Patient with significantly poor oral intake,  unable to maintain adequate oral intake for 7 to 10 days. CT chest done with diffuse esophageal wall thickening most notably in the mid and lower esophagus concerning for esophagitis. Despite carafate  and magic mouthwash w/ lidocaine , Patient is still unable to tolerate even clear  liquid diet. Not meeting needs on calorie count. PEG recommended. Patient is agreeable to PEG tube. IR consulted for PEG placement, tentatively scheduled for 05/17/2024 Of note, Patient has been declining further radiation tx during this admission.  States he may not want to go through rad tx in the future based on current symptoms. Since patient was taking aspirin ,  IR has scheduled for PEG tube on 05/22/2024. Discontinue aspirin  for anticipated PEG tube insertion. Needs to discontinue aspirin  for 7 days. Patient is being discharged home and will follow-up on Monday for PEG tube insertion by IR.   Chest pain > Improving Patient reports persistent central chest pain after returning from CT scan. Patient noted to have LHC in  November 2021 that showed nonobstructive CAD. CT chest with diffuse coronary disease and also findings concerning for esophagitis. Cardiac enzymes negative. Continue telemetry. Continue Nitroglycerin  as needed. Continue IV Lopressor  as patient unable to tolerate oral intake at this time. Aspirin  held for anticipated PEG tube.   Stage IIIa non-small cell lung cancer: He is being followed by medical oncology Dr. Gatha Gatha on concurrent chemoradiation. CT chest done with stable left hilar/medial left upper lobe soft tissue mass and stable right upper pulmonary nodule. Oncology following.   COPD: Appears stable without exacerbation. Continue Trelegy, Ellipta.   Hypertension: Patient with difficulty with oral intake. Discontinued Toprol -XL and continue IV Lopressor  scheduled. Hold amlodipine  for now. Continue IV hydralazine  as needed.   History of CVA: Aspirin  if able to tolerate for secondary stroke prophylaxis when pt can take. Not on a statin prior to admission.   Dehydration: Continue IV fluids. Resolved.   Moderate protein calorie malnutrition Now s/p calorie count. Pt is not  meeting needs. PEG recommended for supplemental tube feeding. Pt agrees  with this Dietitian following    Discharge Instructions  Discharge Instructions     Call MD for:  difficulty breathing, headache or visual disturbances   Complete by: As directed    Call MD for:  persistant dizziness or light-headedness   Complete by: As directed    Call MD for:  persistant nausea and vomiting   Complete by: As directed    Diet - low sodium heart healthy   Complete by: As directed    Diet full liquid   Complete by: As directed    Discharge instructions   Complete by: As directed    Advised to follow-up with primary care physician in 1 week. Advised to discontinue aspirin  until patient have a PEG tube placement on 05/22/2024 Advised to follow-up with IR on Monday for PEG tube insertion   Increase activity slowly   Complete by: As directed       Allergies as of 05/19/2024       Reactions   Fosaprepitant  Shortness Of Breath, Other (See Comments)   SOB, back pain and mild flushing (Emend )   Paclitaxel  Other (See Comments)   Severe back pain radiating down legs, bilateral (see progress note from 5/28)        Medication List     STOP taking these medications    clopidogrel  75 MG tablet Commonly known as: PLAVIX    Trelegy Ellipta  100-62.5-25 MCG/ACT Aepb Generic drug: Fluticasone -Umeclidin-Vilant       TAKE these medications    amLODipine  10 MG tablet Commonly known as: NORVASC  Take 10 mg by mouth daily.   fluticasone  50 MCG/ACT nasal spray Commonly known as: FLONASE  SPRAY 1 SPRAY INTO BOTH NOSTRILS DAILY. What changed: See the new instructions.   lidocaine  2 % solution Commonly known as: XYLOCAINE  Use as directed 15 mLs in the mouth or throat every 3 (three) hours as needed for mouth pain (Oral/esophageal pain). What changed:  when to take this reasons to take this additional instructions   magic mouthwash w/lidocaine  Soln Take 15 mLs by mouth 4 (four) times daily for 10 days. Suspension contains equal amounts of Maalox Extra Strength,  nystatin , diphenhydramine  and lidocaine .   metoprolol  succinate 25 MG 24 hr tablet Commonly known as: TOPROL -XL Take 25 mg by mouth daily.   oxyCODONE -acetaminophen  5-325 MG tablet Commonly known as: Percocet Take 1 tablet by mouth every 4 (four) hours as needed for up to 3 days for severe pain (pain score 7-10).   prochlorperazine  10 MG tablet Commonly known as: COMPAZINE  Take 1 tablet (10 mg total) by mouth every 6 (six) hours as needed for nausea or vomiting.   sucralfate  1 g tablet Commonly known as: CARAFATE  DISSOLVE 1 TABLET IN 10 ML WATER AND SWALLOW 30 MIN PRIOR TO MEALS AND BEDTIME. What changed:  how much to take how to take this when to take this        Follow-up Information     Montey Lot, PA-C Follow up in 1 week(s).   Specialty: Physician Assistant Contact information: [address] [place] [address] [phone]         Luverne Aran, MD Follow up in 3 day(s).   Specialties: Interventional Radiology, Radiology Why: Follow-up on Monday for PEG tube insertion Contact information: [address] [place] [address] [phone]                Allergies  Allergen Reactions  Fosaprepitant  Shortness Of Breath and Other (See Comments)    SOB, back pain and mild flushing (Emend )   Paclitaxel  Other (See Comments)    Severe back pain radiating down legs, bilateral (see progress note from 5/28)    Consultations: IR    Procedures/Studies: CT Chest W Contrast Result Date: 05/08/2024 CLINICAL DATA:  difficulty swallowing, hx of lung cancer, likely post radiation, r/o mass effect EXAM: CT CHEST WITH CONTRAST TECHNIQUE: Multidetector CT imaging of the chest was performed during intravenous contrast administration. RADIATION DOSE REDUCTION: This exam was performed according to the departmental dose-optimization program which includes automated exposure control, adjustment of the mA and/or kV according to patient size  and/or use of iterative reconstruction technique. CONTRAST:  75mL OMNIPAQUE  IOHEXOL  300 MG/ML  SOLN COMPARISON:  03/09/2024 FINDINGS: Cardiovascular: Heart is normal size. Aorta is normal caliber. No filling defects in the pulmonary arteries to suggest pulmonary emboli. Diffuse coronary artery calcifications and scattered aortic atherosclerosis. Mediastinum/Nodes: No mediastinal adenopathy. Stable left hilar soft tissue without discrete measurable node. No right hilar or axillary adenopathy. Esophagus appears diffusely thickened, most notable in the mid and lower esophagus suggesting esophagitis. Small hiatal hernia. Trachea and thyroid  unremarkable. Lungs/Pleura: Left hilar/medial upper lobe soft tissue measures approximately 5.5 x 2.0 cm, not significantly changed since prior study. Well-circumscribed peripheral sub pleural nodule in the right upper lobe measures up to 11 mm compared to 12 mm previously. No new or enlarging pulmonary nodules. Linear scarring in the lingula and left lower lobe. Mild centrilobular emphysema. No pleural effusions. Upper Abdomen: No acute findings Musculoskeletal: Chest wall soft tissues are unremarkable. No acute bony abnormality or focal osseous lesion. IMPRESSION: Stable left hilar/medial left upper lobe soft tissue/mass. Stable right upper lobe pulmonary nodule. Diffuse esophageal wall thickening, most notable in the mid and lower esophagus concerning for esophagitis. Diffuse coronary artery disease. Aortic Atherosclerosis (ICD10-I70.0) and Emphysema (ICD10-J43.9). Electronically Signed   By: Franky Crease M.D.   On: 05/08/2024 18:05   DG Chest Port 1 View Result Date: 05/08/2024 CLINICAL DATA:  Weakness and chest pain EXAM: PORTABLE CHEST 1 VIEW COMPARISON:  Radiograph 12/02/2023 and CT chest 03/09/2024 FINDINGS: Stable cardiomediastinal silhouette. Linear scarring in the left lower lung. Nodular opacity in the left mid lung near the hilum is indeterminate for a pulmonary  nodule versus is vessel seen en face. No pleural effusion or pneumothorax. IMPRESSION: Nodular opacity in the left mid lung near the hilum is indeterminate for a pulmonary nodule versus is vessel seen en face. Consider further evaluation with chest CT. Linear scarring in the left lower lung. Electronically Signed   By: Norman Gatlin M.D.   On: 05/08/2024 16:57    Subjective: Patient was seen and examined at bedside.  Overnight events noted. Patient reports doing much better, tolerated clear liquid diet yesterday. He wants to be discharged and will follow-up on Monday for PEG tube.  Discharge Exam: Vitals:   05/18/24 2043 05/19/24 0506  BP: 138/77 (!) 188/90  Pulse: 61 65  Resp: 16 16  Temp: 98 F (36.7 C) 97.6 F (36.4 C)  SpO2: 93% 95%   Vitals:   05/18/24 0545 05/18/24 1537 05/18/24 2043 05/19/24 0506  BP: (!) 165/77 (!) 160/91 138/77 (!) 188/90  Pulse: (!) 55 74 61 65  Resp: 14 16 16 16   Temp: 98.2 F (36.8 C) 98 F (36.7 C) 98 F (36.7 C) 97.6 F (36.4 C)  TempSrc: Oral Oral Oral Oral  SpO2: 95% 94% 93% 95%  Weight:  Height:        General: Pt is alert, awake, not in acute distress Cardiovascular: RRR, S1/S2 +, no rubs, no gallops Respiratory: CTA bilaterally, no wheezing, no rhonchi Abdominal: Soft, NT, ND, bowel sounds + Extremities: no edema, no cyanosis    The results of significant diagnostics from this hospitalization (including imaging, microbiology, ancillary and laboratory) are listed below for reference.     Microbiology: No results found for this or any previous visit (from the past 240 hours).   Labs: BNP (last 3 results) No results for input(s): BNP in the last 8760 hours. Basic Metabolic Panel: Recent Labs  Lab 05/15/24 0658 05/17/24 0617  NA 138 136  K 3.9 4.0  CL 104 101  CO2 26 27  GLUCOSE 88 93  BUN 15 11  CREATININE 0.81 0.69  CALCIUM  9.0 8.9   Liver Function Tests: Recent Labs  Lab 05/15/24 0658 05/17/24 0617  AST  46* 39  ALT 54* 49*  ALKPHOS 37* 38  BILITOT 1.0 1.0  PROT 6.5 6.4*  ALBUMIN 3.5 3.5   No results for input(s): LIPASE, AMYLASE in the last 168 hours. No results for input(s): AMMONIA in the last 168 hours. CBC: Recent Labs  Lab 05/15/24 0658 05/17/24 0617 05/18/24 0536  WBC 2.4* 2.7* 2.9*  HGB 12.3* 12.5* 12.7*  HCT 36.8* 36.3* 37.2*  MCV 95.1 94.0 93.5  PLT 146* 143* 155   Cardiac Enzymes: No results for input(s): CKTOTAL, CKMB, CKMBINDEX, TROPONINI in the last 168 hours. BNP: Invalid input(s): POCBNP CBG: No results for input(s): GLUCAP in the last 168 hours. D-Dimer No results for input(s): DDIMER in the last 72 hours. Hgb A1c No results for input(s): HGBA1C in the last 72 hours. Lipid Profile No results for input(s): CHOL, HDL, LDLCALC, TRIG, CHOLHDL, LDLDIRECT in the last 72 hours. Thyroid  function studies No results for input(s): TSH, T4TOTAL, T3FREE, THYROIDAB in the last 72 hours.  Invalid input(s): FREET3 Anemia work up No results for input(s): VITAMINB12, FOLATE, FERRITIN, TIBC, IRON, RETICCTPCT in the last 72 hours. Urinalysis    Component Value Date/Time   COLORURINE YELLOW 05/08/2024 1849   APPEARANCEUR CLEAR 05/08/2024 1849   LABSPEC >1.046 (H) 05/08/2024 1849   PHURINE 5.0 05/08/2024 1849   GLUCOSEU NEGATIVE 05/08/2024 1849   HGBUR NEGATIVE 05/08/2024 1849   BILIRUBINUR NEGATIVE 05/08/2024 1849   KETONESUR 20 (A) 05/08/2024 1849   PROTEINUR NEGATIVE 05/08/2024 1849   NITRITE NEGATIVE 05/08/2024 1849   LEUKOCYTESUR NEGATIVE 05/08/2024 1849   Sepsis Labs Recent Labs  Lab 05/15/24 0658 05/17/24 0617 05/18/24 0536  WBC 2.4* 2.7* 2.9*   Microbiology No results found for this or any previous visit (from the past 240 hours).   Time coordinating discharge: Over 30 minutes  SIGNED:   Darcel Dawley, MD  [facility] 05/19/2024, 11:58 AM Pager   If 7PM-7AM, please contact  night-coverage

## 2024-05-19 NOTE — Care Management Important Message (Signed)
 Important Message  Patient Details IM Letter given. Name: Jackson Powers MRN: [id number] Date of Birth: 1946/02/02   Important Message Given:  Yes - Medicare IM     [name] 05/19/2024, 10:38 AM

## 2024-05-20 ENCOUNTER — Other Ambulatory Visit (HOSPITAL_COMMUNITY): Payer: Self-pay

## 2024-05-20 ENCOUNTER — Encounter: Payer: Self-pay | Admitting: Internal Medicine

## 2024-05-22 ENCOUNTER — Ambulatory Visit

## 2024-05-22 ENCOUNTER — Encounter: Payer: Self-pay | Admitting: Internal Medicine

## 2024-05-22 ENCOUNTER — Other Ambulatory Visit (HOSPITAL_COMMUNITY): Payer: Self-pay

## 2024-05-22 ENCOUNTER — Ambulatory Visit: Admission: RE | Admit: 2024-05-22 | Source: Ambulatory Visit

## 2024-05-22 ENCOUNTER — Ambulatory Visit
Admission: RE | Admit: 2024-05-22 | Discharge: 2024-05-22 | Disposition: A | Discharge status: Home / Self Care | Source: Ambulatory Visit | Attending: Radiation Oncology | Admitting: Radiation Oncology

## 2024-05-22 DIAGNOSIS — C3412 Malignant neoplasm of upper lobe, left bronchus or lung: Secondary | ICD-10-CM

## 2024-05-22 NOTE — Progress Notes (Signed)
 Radiation Oncology         [phone] ________________________________  Name: Jackson Powers MRN: [id number]  Date: 05/22/2024  DOB: 1946/10/05  Follow-Up Visit Note  CC: Montey Lot, DEVONNA Montey Lot, PA-C    ICD-10-CM   1. Malignant neoplasm of upper lobe of left lung [facility])  C34.12       Diagnosis:   Stage IIIA (T3, N2, M0) non-small cell lung cancer, favoring adenocarcinoma presented with left hilar soft tissue mass in addition to left hilar and mediastinal lymphadenopathy diagnosed in January 2025 - s/p 3 cycles of neoadjuvant chemo-immunotherapy   Interval Since Last Radiation:  17 days  38 Gy of a planned 60 Gy  (19/30 planned treatments completed)  Narrative: Patient presented today for discussion of whether to resume his radiation therapy.  He has been in the hospital since June 20 secondary to inability to swallow foods.  Scans were suggestive of radiation esophagitis.  Patient was originally scheduled to have a PEG placed for nutrition and hydration purposes today.  He was discharged from the hospital over the weekend and since then his made significant improvement to where he can swallow liquids as well as soft foods without difficulty.  In light of this he decided not to pursue placement of the PEG today.  Given his significant issues with esophagitis patient wishes to discontinue his radiation therapy.  We discussed that he had completed approximately two thirds of his planned course of radiation therapy and that he has not received optimal dose but given his significant symptomatology and prior history of esophageal issues in the past he does not wish to resume his radiation treatments.  We also discussed that he should likely discontinue his mild chemotherapy since he will no longer be receiving radiation treatment.  Patient will follow-up with Dr. Sherrod in the near future to discuss other treatment options.  He did have a CT scan of the chest during his hospitalization  which did show stability of disease without any progression or new findings.                             ALLERGIES:  is allergic to fosaprepitant  and paclitaxel .  Meds: Current Outpatient Medications  Medication Sig Dispense Refill   amLODipine  (NORVASC ) 10 MG tablet Take 10 mg by mouth daily.     fluticasone  (FLONASE ) 50 MCG/ACT nasal spray SPRAY 1 SPRAY INTO BOTH NOSTRILS DAILY. (Patient taking differently: Place 1 spray into both nostrils daily.) 16 mL 2   lidocaine  (XYLOCAINE ) 2 % solution Use as directed 15 mLs in the mouth or throat every 3 (three) hours as needed for mouth pain (Oral/esophageal pain). 20 mL 0   alum & mag hydroxide-simeth-lidocaine -diphenhydrAMINE -nystatin  Take 15 mL by mouth 4 (four) times daily for 10 days. Suspension contains equal amounts of Maalox Extra Strength, nystatin , diphenhydramine  and lidocaine . 600 mL 0   metoprolol  succinate (TOPROL -XL) 25 MG 24 hr tablet Take 25 mg by mouth daily.     oxyCODONE -acetaminophen  (PERCOCET) 5-325 MG tablet Take 1 tablet by mouth every 4 (four) hours as needed for up to 3 days for severe pain (pain score 7-10). 9 tablet 0   prochlorperazine  (COMPAZINE ) 10 MG tablet Take 1 tablet (10 mg total) by mouth every 6 (six) hours as needed for nausea or vomiting. 30 tablet 0   sucralfate  (CARAFATE ) 1 g tablet DISSOLVE 1 TABLET IN 10 ML WATER AND SWALLOW 30 MIN PRIOR TO MEALS AND  BEDTIME. (Patient taking differently: Take 1 g by mouth 4 (four) times daily -  with meals and at bedtime. DISSOLVE 1 TABLET IN 10 ML WATER AND SWALLOW 30 MIN PRIOR TO MEALS AND BEDTIME.) 90 tablet 0   No current facility-administered medications for this encounter.   Facility-Administered Medications Ordered in Other Encounters  Medication Dose Route Frequency Provider Last Rate Last Admin   sodium chloride  flush (NS) 0.9 % injection 3 mL  3 mL Intravenous Q12H Fernand Denyse LABOR, MD        Physical Findings: The patient is in no acute distress. Patient is alert  and oriented.  The lungs are clear.  The heart has a regular rhythm and rate.  No palpable supraclavicular or axillary adenopathy.   Lab Findings: Lab Results  Component Value Date   WBC 2.9 (L) 05/18/2024   HGB 12.7 (L) 05/18/2024   HCT 37.2 (L) 05/18/2024   MCV 93.5 05/18/2024   PLT 155 05/18/2024    Radiographic Findings: CT Chest W Contrast Result Date: 05/08/2024 CLINICAL DATA:  difficulty swallowing, hx of lung cancer, likely post radiation, r/o mass effect EXAM: CT CHEST WITH CONTRAST TECHNIQUE: Multidetector CT imaging of the chest was performed during intravenous contrast administration. RADIATION DOSE REDUCTION: This exam was performed according to the departmental dose-optimization program which includes automated exposure control, adjustment of the mA and/or kV according to patient size and/or use of iterative reconstruction technique. CONTRAST:  75mL OMNIPAQUE  IOHEXOL  300 MG/ML  SOLN COMPARISON:  03/09/2024 FINDINGS: Cardiovascular: Heart is normal size. Aorta is normal caliber. No filling defects in the pulmonary arteries to suggest pulmonary emboli. Diffuse coronary artery calcifications and scattered aortic atherosclerosis. Mediastinum/Nodes: No mediastinal adenopathy. Stable left hilar soft tissue without discrete measurable node. No right hilar or axillary adenopathy. Esophagus appears diffusely thickened, most notable in the mid and lower esophagus suggesting esophagitis. Small hiatal hernia. Trachea and thyroid  unremarkable. Lungs/Pleura: Left hilar/medial upper lobe soft tissue measures approximately 5.5 x 2.0 cm, not significantly changed since prior study. Well-circumscribed peripheral sub pleural nodule in the right upper lobe measures up to 11 mm compared to 12 mm previously. No new or enlarging pulmonary nodules. Linear scarring in the lingula and left lower lobe. Mild centrilobular emphysema. No pleural effusions. Upper Abdomen: No acute findings Musculoskeletal: Chest wall  soft tissues are unremarkable. No acute bony abnormality or focal osseous lesion. IMPRESSION: Stable left hilar/medial left upper lobe soft tissue/mass. Stable right upper lobe pulmonary nodule. Diffuse esophageal wall thickening, most notable in the mid and lower esophagus concerning for esophagitis. Diffuse coronary artery disease. Aortic Atherosclerosis (ICD10-I70.0) and Emphysema (ICD10-J43.9). Electronically Signed   By: Franky Crease M.D.   On: 05/08/2024 18:05   DG Chest Port 1 View Result Date: 05/08/2024 CLINICAL DATA:  Weakness and chest pain EXAM: PORTABLE CHEST 1 VIEW COMPARISON:  Radiograph 12/02/2023 and CT chest 03/09/2024 FINDINGS: Stable cardiomediastinal silhouette. Linear scarring in the left lower lung. Nodular opacity in the left mid lung near the hilum is indeterminate for a pulmonary nodule versus is vessel seen en face. No pleural effusion or pneumothorax. IMPRESSION: Nodular opacity in the left mid lung near the hilum is indeterminate for a pulmonary nodule versus is vessel seen en face. Consider further evaluation with chest CT. Linear scarring in the left lower lung. Electronically Signed   By: Norman Gatlin M.D.   On: 05/08/2024 16:57    Impression:  Stage IIIA (T3, N2, M0) non-small cell lung cancer, favoring adenocarcinoma presented with left hilar  soft tissue mass in addition to left hilar and mediastinal lymphadenopathy diagnosed in January 2025 - s/p 3 cycles of neoadjuvant chemo-immunotherapy   The patient completed a suboptimal course of radiation therapy given his significant symptomatology.  Fortunately he has improved significantly over the past few days and feels he can avoid placement of a PEG for nutrition and hydration purposes.  Plan: Routine follow-up in 1 month.  He will also follow-up with medical oncology in the near future.  ____________________________________ Lynwood Nasuti, MD

## 2024-05-23 ENCOUNTER — Inpatient Hospital Stay

## 2024-05-23 ENCOUNTER — Other Ambulatory Visit: Payer: Self-pay

## 2024-05-23 ENCOUNTER — Inpatient Hospital Stay: Admitting: Nutrition

## 2024-05-23 ENCOUNTER — Ambulatory Visit

## 2024-05-23 ENCOUNTER — Other Ambulatory Visit

## 2024-05-23 ENCOUNTER — Encounter: Payer: Self-pay | Admitting: Nutrition

## 2024-05-23 ENCOUNTER — Ambulatory Visit: Payer: Self-pay

## 2024-05-23 NOTE — Progress Notes (Signed)
 Patient was scheduled for nutrition appointment due to recent hospitalization and plans for feeding tube placement.  All of his appointments were canceled today.    Patient had severe esophagitis from radiation therapy.  During chart review noticed patient has improved and is able to swallow and has increased oral intake.  He has decided not to get a feeding tube.  He is no longer going to pursue radiation therapy.  Will not reschedule nutrition appointment at this time.  Will monitor weight and reschedule nutrition as needed.

## 2024-05-24 ENCOUNTER — Ambulatory Visit

## 2024-05-24 ENCOUNTER — Encounter: Payer: Self-pay | Admitting: Internal Medicine

## 2024-05-24 ENCOUNTER — Telehealth: Payer: Self-pay | Admitting: Physician Assistant

## 2024-05-24 NOTE — Radiation Completion Notes (Addendum)
  Radiation Oncology         [phone] ________________________________  Name: Jackson Powers MRN: [id number]  Date of Service: 05/22/2024  DOB: 07/21/1946  End of Treatment Note  Diagnosis: Stage IIIA (T3, N2, M0) non-small cell lung cancer, favoring adenocarcinoma  Intent: Curative     ==========DELIVERED PLANS==========  First Treatment Date: 2024-04-11 Last Treatment Date: 2024-05-05   Plan Name: Lung_L Site: Lung, Left Technique: 3D Mode: Photon Dose Per Fraction: 2 Gy Prescribed Dose (Delivered / Prescribed): 38 Gy / 60 Gy Prescribed Fxs (Delivered / Prescribed): 19 / 30     ====================================  The patient developed a chest pressure, shortness of breath, and significant esophagitis. He was given Carafate  and Xylocaine  to help with this. The patient decided to stop treatment early, due to his significant symptomatology. He developed fatigue and anticipated skin changes in the treatment field.   The patient will return in one month and will continue follow up with Dr. Sherrod as well.      Ronita Due, PA-C

## 2024-05-24 NOTE — Telephone Encounter (Signed)
 Scheduled appointments with the patients wife.

## 2024-05-25 ENCOUNTER — Ambulatory Visit

## 2024-05-26 ENCOUNTER — Ambulatory Visit

## 2024-05-26 NOTE — Progress Notes (Unsigned)
 [facility] OFFICE PROGRESS NOTE  Montey Lot, PA-C [address] [place] [address]  DIAGNOSIS: Stage IIIA (T3, N2, M0) non-small cell lung cancer, favoring adenocarcinoma presented with left hilar soft tissue mass in addition to left hilar and mediastinal lymphadenopathy diagnosed in January 2025.   PRIOR THERAPY: 1) Neoadjuvant systemic chemotherapy with carboplatin  for AUC of 5, Alimta  500 Mg/M2 and nivolumab  360 Mg IV every 3 weeks. First dose December 30, 2023. Status post 3 cycles.  2) Concurrent chemoradiation with carboplatin  for an AUC of 2.  The patient had a reaction to paclitaxel  and this will be switched to Abraxane  45 mg/m2 starting from cycle #2. Last dose on 05/02/24. Status post 4 cycles  CURRENT THERAPY: Consolidation immunotherapy with Imfinzi 155 mg IV every 4 weeks. First dose expected on 06/06/24.   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  In summary, the patient was undergoing neoadjuvant treatment with 3 cycles of chemo and immunotherapy.  He was not eligible for surgical resection so then he underwent concurrent chemoradiation.  His last dose was on 05/02/24.  He status post 4 cycles.  He presented to the hospital at the end of June 2025 secondary to radiation-induced esophagitis.  Dr. Sherrod saw the patient while inpatient and recommended discontinuing his treatment early.  Since being discharged from the hospital his swallowing has ***.  Pain?  Carafate ?  Lidocaine ?  Weight loss?  Appetite?  He denies any fever, chills, or night sweats.  His breathing is ***.  He denies any hemoptysis.  Cough, shortness of breath?  He denies any nausea or vomiting.  He denies any diarrhea or constipation.  He denies any headache or visual changes.  He had a restaging CT scan performed while admitted to the hospital.  He is here today for evaluation and to review his scan results and for a more detailed discussion about  his current condition neck steps in treatment. MEDICAL HISTORY: Past Medical History:  Diagnosis Date   Acute gastritis without hemorrhage 2007   Arthritis    osteoarthritis    Benign neoplasm of colon 2007   COPD (chronic obstructive pulmonary disease) (HCC)    Esophagitis 2007   Hypertension    Lung cancer (HCC) 12/14/2023   Orthopnea    reports  i have trouble breathing when i lie completely flat. i have to use 2 people to prop my head up    Panic anxiety syndrome    patient reports having severe panic anxiety and claustraphobia. states with ear surgery years ago , they had to tie him down which caused him to panic even more;    Right BBB/left ant fasc block    (OLD)   TIA (transient ischemic attack)     ALLERGIES:  is allergic to fosaprepitant  and paclitaxel .  MEDICATIONS:  Current Outpatient Medications  Medication Sig Dispense Refill   amLODipine  (NORVASC ) 10 MG tablet Take 10 mg by mouth daily.     fluticasone  (FLONASE ) 50 MCG/ACT nasal spray SPRAY 1 SPRAY INTO BOTH NOSTRILS DAILY. (Patient taking differently: Place 1 spray into both nostrils daily.) 16 mL 2   lidocaine  (XYLOCAINE ) 2 % solution Use as directed 15 mLs in the mouth or throat every 3 (three) hours as needed for mouth pain (Oral/esophageal pain). 20 mL 0   alum & mag hydroxide-simeth-lidocaine -diphenhydrAMINE -nystatin  Take 15 mL by mouth 4 (four) times daily for 10 days. Suspension contains equal amounts of Maalox Extra Strength, nystatin ,  diphenhydramine  and lidocaine . 600 mL 0   metoprolol  succinate (TOPROL -XL) 25 MG 24 hr tablet Take 25 mg by mouth daily.     prochlorperazine  (COMPAZINE ) 10 MG tablet Take 1 tablet (10 mg total) by mouth every 6 (six) hours as needed for nausea or vomiting. 30 tablet 0   sucralfate  (CARAFATE ) 1 g tablet DISSOLVE 1 TABLET IN 10 ML WATER AND SWALLOW 30 MIN PRIOR TO MEALS AND BEDTIME. (Patient taking differently: Take 1 g by mouth 4 (four) times daily -  with meals and at bedtime.  DISSOLVE 1 TABLET IN 10 ML WATER AND SWALLOW 30 MIN PRIOR TO MEALS AND BEDTIME.) 90 tablet 0   No current facility-administered medications for this visit.   Facility-Administered Medications Ordered in Other Visits  Medication Dose Route Frequency Provider Last Rate Last Admin   sodium chloride  flush (NS) 0.9 % injection 3 mL  3 mL Intravenous Q12H Fernand Denyse LABOR, MD        SURGICAL HISTORY:  Past Surgical History:  Procedure Laterality Date   BRONCHIAL BIOPSY  12/02/2023   Procedure: BRONCHIAL BIOPSIES;  Surgeon: Isadora Hose, MD;  Location: [facility];  Service: Pulmonary;;   BRONCHIAL BRUSHINGS  12/02/2023   Procedure: BRONCHIAL BRUSHINGS;  Surgeon: Isadora Hose, MD;  Location: [facility];  Service: Pulmonary;;   BRONCHIAL NEEDLE ASPIRATION BIOPSY  12/02/2023   Procedure: BRONCHIAL NEEDLE ASPIRATION BIOPSIES;  Surgeon: Isadora Hose, MD;  Location: [facility];  Service: Pulmonary;;   BRONCHIAL WASHINGS  12/02/2023   Procedure: BRONCHIAL WASHINGS;  Surgeon: Isadora Hose, MD;  Location: [facility];  Service: Pulmonary;;   LEFT HEART CATH AND CORONARY ANGIOGRAPHY Left 10/01/2020   Procedure: LEFT HEART CATH AND CORONARY ANGIOGRAPHY;  Surgeon: Fernand Denyse LABOR, MD;  Location: [facility];  Service: Cardiovascular;  Laterality: Left;   SKIN CANCER EXCISION Right 2017   EAR; FAILED GRAFT FOLLWOING SURGERY    TOTAL HIP ARTHROPLASTY Right 07/20/2018   Procedure: RIGHT TOTAL HIP ARTHROPLASTY ANTERIOR APPROACH;  Surgeon: Melodi Lerner, MD;  Location: [facility];  Service: Orthopedics;  Laterality: Right;   VIDEO BRONCHOSCOPY WITH ENDOBRONCHIAL ULTRASOUND  12/02/2023   Procedure: VIDEO BRONCHOSCOPY WITH ENDOBRONCHIAL ULTRASOUND;  Surgeon: Isadora Hose, MD;  Location: [facility];  Service: Pulmonary;;    REVIEW OF SYSTEMS:   Review of Systems  Constitutional: Negative for appetite change, chills, fatigue, fever and unexpected weight change.  HENT:   Negative for mouth  sores, nosebleeds, sore throat and trouble swallowing.   Eyes: Negative for eye problems and icterus.  Respiratory: Negative for cough, hemoptysis, shortness of breath and wheezing.   Cardiovascular: Negative for chest pain and leg swelling.  Gastrointestinal: Negative for abdominal pain, constipation, diarrhea, nausea and vomiting.  Genitourinary: Negative for bladder incontinence, difficulty urinating, dysuria, frequency and hematuria.   Musculoskeletal: Negative for back pain, gait problem, neck pain and neck stiffness.  Skin: Negative for itching and rash.  Neurological: Negative for dizziness, extremity weakness, gait problem, headaches, light-headedness and seizures.  Hematological: Negative for adenopathy. Does not bruise/bleed easily.  Psychiatric/Behavioral: Negative for confusion, depression and sleep disturbance. The patient is not nervous/anxious.     PHYSICAL EXAMINATION:  There were no vitals taken for this visit.  ECOG PERFORMANCE STATUS: {CHL ONC ECOG [id number]  Physical Exam  Constitutional: Oriented to person, place, and time and well-developed, well-nourished, and in no distress. No distress.  HENT:  Head: Normocephalic and atraumatic.  Mouth/Throat: Oropharynx is clear and moist. No oropharyngeal exudate.  Eyes: Conjunctivae are normal.  Right eye exhibits no discharge. Left eye exhibits no discharge. No scleral icterus.  Neck: Normal range of motion. Neck supple.  Cardiovascular: Normal rate, regular rhythm, normal heart sounds and intact distal pulses.   Pulmonary/Chest: Effort normal and breath sounds normal. No respiratory distress. No wheezes. No rales.  Abdominal: Soft. Bowel sounds are normal. Exhibits no distension and no mass. There is no tenderness.  Musculoskeletal: Normal range of motion. Exhibits no edema.  Lymphadenopathy:    No cervical adenopathy.  Neurological: Alert and oriented to person, place, and time. Exhibits normal muscle tone. Gait  normal. Coordination normal.  Skin: Skin is warm and dry. No rash noted. Not diaphoretic. No erythema. No pallor.  Psychiatric: Mood, memory and judgment normal.  Vitals reviewed.  LABORATORY DATA: Lab Results  Component Value Date   WBC 2.9 (L) 05/18/2024   HGB 12.7 (L) 05/18/2024   HCT 37.2 (L) 05/18/2024   MCV 93.5 05/18/2024   PLT 155 05/18/2024      Chemistry      Component Value Date/Time   NA 136 05/17/2024 0617   K 4.0 05/17/2024 0617   CL 101 05/17/2024 0617   CO2 27 05/17/2024 0617   BUN 11 05/17/2024 0617   CREATININE 0.69 05/17/2024 0617   CREATININE 0.97 05/02/2024 1345      Component Value Date/Time   CALCIUM  8.9 05/17/2024 0617   ALKPHOS 38 05/17/2024 0617   AST 39 05/17/2024 0617   AST 19 05/02/2024 1345   ALT 49 (H) 05/17/2024 0617   ALT 22 05/02/2024 1345   BILITOT 1.0 05/17/2024 0617   BILITOT 0.8 05/02/2024 1345       RADIOGRAPHIC STUDIES:  CT Chest W Contrast Result Date: 05/08/2024 CLINICAL DATA:  difficulty swallowing, hx of lung cancer, likely post radiation, r/o mass effect EXAM: CT CHEST WITH CONTRAST TECHNIQUE: Multidetector CT imaging of the chest was performed during intravenous contrast administration. RADIATION DOSE REDUCTION: This exam was performed according to the departmental dose-optimization program which includes automated exposure control, adjustment of the mA and/or kV according to patient size and/or use of iterative reconstruction technique. CONTRAST:  75mL OMNIPAQUE  IOHEXOL  300 MG/ML  SOLN COMPARISON:  03/09/2024 FINDINGS: Cardiovascular: Heart is normal size. Aorta is normal caliber. No filling defects in the pulmonary arteries to suggest pulmonary emboli. Diffuse coronary artery calcifications and scattered aortic atherosclerosis. Mediastinum/Nodes: No mediastinal adenopathy. Stable left hilar soft tissue without discrete measurable node. No right hilar or axillary adenopathy. Esophagus appears diffusely thickened, most notable in  the mid and lower esophagus suggesting esophagitis. Small hiatal hernia. Trachea and thyroid  unremarkable. Lungs/Pleura: Left hilar/medial upper lobe soft tissue measures approximately 5.5 x 2.0 cm, not significantly changed since prior study. Well-circumscribed peripheral sub pleural nodule in the right upper lobe measures up to 11 mm compared to 12 mm previously. No new or enlarging pulmonary nodules. Linear scarring in the lingula and left lower lobe. Mild centrilobular emphysema. No pleural effusions. Upper Abdomen: No acute findings Musculoskeletal: Chest wall soft tissues are unremarkable. No acute bony abnormality or focal osseous lesion. IMPRESSION: Stable left hilar/medial left upper lobe soft tissue/mass. Stable right upper lobe pulmonary nodule. Diffuse esophageal wall thickening, most notable in the mid and lower esophagus concerning for esophagitis. Diffuse coronary artery disease. Aortic Atherosclerosis (ICD10-I70.0) and Emphysema (ICD10-J43.9). Electronically Signed   By: Franky Crease M.D.   On: 05/08/2024 18:05   DG Chest Port 1 View Result Date: 05/08/2024 CLINICAL DATA:  Weakness and chest pain EXAM: PORTABLE CHEST 1 VIEW  COMPARISON:  Radiograph 12/02/2023 and CT chest 03/09/2024 FINDINGS: Stable cardiomediastinal silhouette. Linear scarring in the left lower lung. Nodular opacity in the left mid lung near the hilum is indeterminate for a pulmonary nodule versus is vessel seen en face. No pleural effusion or pneumothorax. IMPRESSION: Nodular opacity in the left mid lung near the hilum is indeterminate for a pulmonary nodule versus is vessel seen en face. Consider further evaluation with chest CT. Linear scarring in the left lower lung. Electronically Signed   By: Norman Gatlin M.D.   On: 05/08/2024 16:57     ASSESSMENT/PLAN:  This is a very pleasant 78 year old Caucasian male with Stage IIIA (T3, N2, M0) non-small cell lung cancer, favoring adenocarcinoma presented with left hilar soft  tissue mass in addition to left hilar and mediastinal lymphadenopathy diagnosed in January 2025.  He underwent neoadjuvant systemic chemotherapy with carboplatin  for AUC of 5, Alimta  500 Mg/M2 and nivolumab  360 Mg IV every 3 weeks status post 3 cycles.  He had been tolerating this treatment fairly well. The patient was not a good surgical candidate for resection.   Therefore he was started on concurrent chemoradiation with carboplatin  for an AUC of 2 and paclitaxel  45 mg/m.  His first dose of treatment was on 04/12/2024.  He had a reaction to Taxol  and this has since been switched to Abraxane  45 mg/m2.   he is status post 4 cycles of treatment.  This was discontinued early secondary to radiation induced esophagitis.  The patient was seen with Dr. Sherrod today.  Dr. Sherrod personally and independently reviewed the scan and discussed results with the patient today.  The scan showed ***.  Dr. Sherrod recommends ***  Dr. Sherrod recommended that the patient move onto the consolidation phase of treatment which includes immunotherapy with Imfinzi 1500 mg IV every 4 weeks.  The patient is interested in this option and he is expected to undergo his first cycle of treatment on 06/06/2024.  I discussed with her the adverse effect of the immunotherapy including but not limited to immunotherapy mediated skin rash, diarrhea, inflammation of the lung, kidney, liver, thyroid  or other endocrine dysfunction  We will see him back for follow-up visit in 5 weeks for evaluation repeat blood work before undergoing cycle #5.  Swallowing?  The patient was advised to call immediately if he has any concerning symptoms in the interval. The patient voices understanding of current disease status and treatment options and is in agreement with the current care plan. All questions were answered. The patient knows to call the clinic with any problems, questions or concerns. We can certainly see the patient much sooner if  necessary        No orders of the defined types were placed in this encounter.    I spent {CHL ONC TIME VISIT - DTPQU:[id number]} counseling the patient face to face. The total time spent in the appointment was {CHL ONC TIME VISIT - DTPQU:[id number]}.  Miroslav Gin L Caedin Mogan, PA-C 05/26/24

## 2024-05-29 ENCOUNTER — Ambulatory Visit

## 2024-05-30 ENCOUNTER — Inpatient Hospital Stay: Attending: Physician Assistant

## 2024-05-30 ENCOUNTER — Inpatient Hospital Stay (HOSPITAL_BASED_OUTPATIENT_CLINIC_OR_DEPARTMENT_OTHER): Admitting: Physician Assistant

## 2024-05-30 ENCOUNTER — Ambulatory Visit

## 2024-05-30 VITALS — BP 126/68 | HR 69 | Temp 98.1°F | Resp 13 | Wt 220.1 lb

## 2024-05-30 DIAGNOSIS — Z923 Personal history of irradiation: Secondary | ICD-10-CM | POA: Diagnosis not present

## 2024-05-30 DIAGNOSIS — C3412 Malignant neoplasm of upper lobe, left bronchus or lung: Secondary | ICD-10-CM

## 2024-05-30 DIAGNOSIS — Z9221 Personal history of antineoplastic chemotherapy: Secondary | ICD-10-CM | POA: Insufficient documentation

## 2024-05-30 LAB — CBC WITH DIFFERENTIAL (CANCER CENTER ONLY)
Abs Immature Granulocytes: 0.01 K/uL (ref 0.00–0.07)
Basophils Absolute: 0 K/uL (ref 0.0–0.1)
Basophils Relative: 1 %
Eosinophils Absolute: 0.1 K/uL (ref 0.0–0.5)
Eosinophils Relative: 2 %
HCT: 36 % — ABNORMAL LOW (ref 39.0–52.0)
Hemoglobin: 12.5 g/dL — ABNORMAL LOW (ref 13.0–17.0)
Immature Granulocytes: 0 %
Lymphocytes Relative: 30 %
Lymphs Abs: 1.8 K/uL (ref 0.7–4.0)
MCH: 32.6 pg (ref 26.0–34.0)
MCHC: 34.7 g/dL (ref 30.0–36.0)
MCV: 93.8 fL (ref 80.0–100.0)
Monocytes Absolute: 0.4 K/uL (ref 0.1–1.0)
Monocytes Relative: 7 %
Neutro Abs: 3.7 K/uL (ref 1.7–7.7)
Neutrophils Relative %: 60 %
Platelet Count: 185 K/uL (ref 150–400)
RBC: 3.84 MIL/uL — ABNORMAL LOW (ref 4.22–5.81)
RDW: 15.9 % — ABNORMAL HIGH (ref 11.5–15.5)
WBC Count: 6.1 K/uL (ref 4.0–10.5)
nRBC: 0 % (ref 0.0–0.2)

## 2024-05-30 LAB — CMP (CANCER CENTER ONLY)
ALT: 35 U/L (ref 0–44)
AST: 31 U/L (ref 15–41)
Albumin: 4 g/dL (ref 3.5–5.0)
Alkaline Phosphatase: 39 U/L (ref 38–126)
Anion gap: 4 — ABNORMAL LOW (ref 5–15)
BUN: 19 mg/dL (ref 8–23)
CO2: 29 mmol/L (ref 22–32)
Calcium: 9.5 mg/dL (ref 8.9–10.3)
Chloride: 107 mmol/L (ref 98–111)
Creatinine: 0.88 mg/dL (ref 0.61–1.24)
GFR, Estimated: >60 mL/min (ref >60)
Glucose, Bld: 152 mg/dL — ABNORMAL HIGH (ref 70–99)
Potassium: 4 mmol/L (ref 3.5–5.1)
Sodium: 140 mmol/L (ref 135–145)
Total Bilirubin: 0.6 mg/dL (ref 0.0–1.2)
Total Protein: 6.7 g/dL (ref 6.5–8.1)

## 2024-05-30 NOTE — Patient Instructions (Signed)
-  We covered a lot of important information at your appointment today regarding what the treatment plan is moving forward. Here are the the main points that were discussed at your office visit with [place] today:  -The treatment will consist of a new medication. This is not chemotherapy. This new drug is a type of Immunotherapy called Imfinzi (Durvalumab).  -We are planning on starting your treatment next week on _/_/_  -Your treatment will be given once every 4 weeks. You will receive this treatment every four weeks for a total of 1 year (13 total treatments) unless you experience unacceptable toxicity or if there is evidence on your routine CT scans that the cancer is growing  -We will get a CT scan after every 3 treatments to check on the progress of treatment  Side Effects:  -The adverse effect of the immunotherapy including but not limited to immunotherapy mediated skin rash, diarrhea, inflammation of the lung, kidney, liver, thyroid or other endocrine dysfunction  Follow up:  -We will see you back for a follow up visit in __.

## 2024-05-31 ENCOUNTER — Ambulatory Visit

## 2024-05-31 ENCOUNTER — Other Ambulatory Visit: Payer: Self-pay

## 2024-05-31 NOTE — Addendum Note (Signed)
 Encounter addended by: Wyatt Leeroy HERO, PA-C on: 05/31/2024 1:43 PM  Actions taken: Clinical Note Signed

## 2024-06-01 ENCOUNTER — Ambulatory Visit

## 2024-06-02 ENCOUNTER — Ambulatory Visit

## 2024-06-05 ENCOUNTER — Ambulatory Visit

## 2024-06-13 ENCOUNTER — Other Ambulatory Visit: Payer: Self-pay | Admitting: Pulmonary Disease

## 2024-06-13 DIAGNOSIS — J310 Chronic rhinitis: Secondary | ICD-10-CM

## 2024-06-21 NOTE — Progress Notes (Signed)
 Radiation Oncology         [phone] ________________________________  Name: Jackson Powers MRN: [id number]  Date: 06/22/2024  DOB: 15-Dec-1945  Follow-Up Visit Note  CC: Montey Lot, DEVONNA Montey Lot, PA-C  No diagnosis found. ***  Diagnosis: Stage IIIA (T3, N2, M0) non-small cell lung cancer, favoring adenocarcinoma presented with left hilar soft tissue mass in addition to left hilar and mediastinal lymphadenopathy diagnosed in January 2025    Interval Since Last Radiation:  1 month  18 days Intent: curative  First Treatment Date: 2024-04-11 Last Treatment Date: 2024-05-05   Plan Name: Lung_L Site: Lung, Left Technique: 3D Mode: Photon Dose Per Fraction: 2 Gy Prescribed Dose (Delivered / Prescribed): 38 Gy / 60 Gy Prescribed Fxs (Delivered / Prescribed): 19 / 30   Narrative:  The patient returns today for routine follow-up. He was last seen in office on 05/22/24 for a follow up visit. Since then, patient completed his radiation treatment which he  tolerated quite well. Patient did however endorse experiencing  chest pressure, shortness of breath, and significant esophagitis for which he was given Carafate  and Xylocaine  for. Patient also reported experiencing  fatigue and anticipated skin changes in the treatment field. Due to his significant symptomatology, patient decided to stop treatment early.    In the interval since she was last seen, he  presented for a follow up visit with PA Heilingoetter on 05/30/24 during which immunotherapy with Imfinzi 1500 mg IV every 4 weeks was recommended, however, patient was not interested in treatment and opted to proceed with observation only.   ***.   Allergies:  is allergic to fosaprepitant  and paclitaxel .  Meds: Current Outpatient Medications  Medication Sig Dispense Refill   amLODipine  (NORVASC ) 10 MG tablet Take 10 mg by mouth daily.     fluticasone  (FLONASE ) 50 MCG/ACT nasal spray SPRAY 1 SPRAY INTO BOTH NOSTRILS DAILY. 16 mL 2    lidocaine  (XYLOCAINE ) 2 % solution Use as directed 15 mLs in the mouth or throat every 3 (three) hours as needed for mouth pain (Oral/esophageal pain). 20 mL 0   metoprolol  succinate (TOPROL -XL) 25 MG 24 hr tablet Take 25 mg by mouth daily.     prochlorperazine  (COMPAZINE ) 10 MG tablet Take 1 tablet (10 mg total) by mouth every 6 (six) hours as needed for nausea or vomiting. 30 tablet 0   sucralfate  (CARAFATE ) 1 g tablet DISSOLVE 1 TABLET IN 10 ML WATER AND SWALLOW 30 MIN PRIOR TO MEALS AND BEDTIME. (Patient taking differently: Take 1 g by mouth 4 (four) times daily -  with meals and at bedtime. DISSOLVE 1 TABLET IN 10 ML WATER AND SWALLOW 30 MIN PRIOR TO MEALS AND BEDTIME.) 90 tablet 0   No current facility-administered medications for this encounter.   Facility-Administered Medications Ordered in Other Encounters  Medication Dose Route Frequency Provider Last Rate Last Admin   sodium chloride  flush (NS) 0.9 % injection 3 mL  3 mL Intravenous Q12H Fernand Denyse LABOR, MD        Physical Findings: The patient is in no acute distress. Patient is alert and oriented.  vitals were not taken for this visit. .  No significant changes. Lungs are clear to auscultation bilaterally. Heart has regular rate and rhythm. No palpable cervical, supraclavicular, or axillary adenopathy. Abdomen soft, non-tender, normal bowel sounds.   Lab Findings: Lab Results  Component Value Date   WBC 6.1 05/30/2024   HGB 12.5 (L) 05/30/2024   HCT 36.0 (L) 05/30/2024  MCV 93.8 05/30/2024   PLT 185 05/30/2024    Radiographic Findings: No results found.  Impression: Stage IIIA (T3, N2, M0) non-small cell lung cancer, favoring adenocarcinoma presented with left hilar soft tissue mass in addition to left hilar and mediastinal lymphadenopathy diagnosed in January 2025  The patient has recovered well from the effects of his radiation treatment. He has opted to proceed with observation under the care of Dr. Sherrod. He is  scheduled for a restaging CT of the chest October. Radiation follow-up PRN. We appreciate the opportunity to take part in this patient's care. He was encouraged to call with any questions or concerns. ***   *** minutes of total time was spent for this patient encounter, including preparation, face-to-face counseling with the patient and coordination of care, physical exam, and documentation of the encounter. ____________________________________    Leeroy Due, PA-C   This document serves as a record of services personally performed by Lynwood Nasuti, MD. It was created on her behalf by Reymundo Cartwright, a trained medical scribe. The creation of this record is based on the scribe's personal observations and the provider's statements to them. This document has been checked and approved by the attending provider.

## 2024-06-22 ENCOUNTER — Ambulatory Visit
Admission: RE | Admit: 2024-06-22 | Discharge: 2024-06-22 | Disposition: A | Discharge status: Home / Self Care | Source: Ambulatory Visit | Attending: Radiation Oncology | Admitting: Radiation Oncology

## 2024-06-22 ENCOUNTER — Encounter: Payer: Self-pay | Admitting: Radiation Oncology

## 2024-06-22 VITALS — BP 139/86 | HR 71 | Temp 98.0°F | Resp 17 | Ht 75.0 in | Wt 230.6 lb

## 2024-06-22 DIAGNOSIS — Z79899 Other long term (current) drug therapy: Secondary | ICD-10-CM | POA: Diagnosis not present

## 2024-06-22 DIAGNOSIS — K209 Esophagitis, unspecified without bleeding: Secondary | ICD-10-CM | POA: Diagnosis not present

## 2024-06-22 DIAGNOSIS — C3412 Malignant neoplasm of upper lobe, left bronchus or lung: Secondary | ICD-10-CM | POA: Insufficient documentation

## 2024-06-22 DIAGNOSIS — Z923 Personal history of irradiation: Secondary | ICD-10-CM | POA: Insufficient documentation

## 2024-06-22 NOTE — Progress Notes (Signed)
 Paige Vanderwoude is here today for follow up post radiation to the lung.  Lung Side: left,patient completed treatment on 05/05/24  Does the patient complain of any of the following: Pain: No Shortness of breath w/wo exertion: No Cough: No Hemoptysis: No Pain with swallowing: no Swallowing/choking concerns: No Appetite: good  Energy Level: fair  Post radiation skin Changes: No    Additional comments if applicable:   BP 139/86   Pulse 71   Temp 98 F (36.7 C)   Resp 17   Ht 6' 3 (1.905 m)   Wt 230 lb 9.6 oz (104.6 kg)   SpO2 98%   BMI 28.82 kg/m

## 2024-08-16 ENCOUNTER — Encounter: Payer: Self-pay | Admitting: Internal Medicine

## 2024-08-21 ENCOUNTER — Encounter: Payer: Self-pay | Admitting: Internal Medicine

## 2024-08-22 ENCOUNTER — Telehealth: Payer: Self-pay | Admitting: Internal Medicine

## 2024-08-22 NOTE — Telephone Encounter (Signed)
 Left the patient a voicemail with the rescheduled appointment changes. The patient is also active on MyChart.

## 2024-08-23 ENCOUNTER — Inpatient Hospital Stay

## 2024-08-23 ENCOUNTER — Ambulatory Visit (HOSPITAL_COMMUNITY)

## 2024-08-24 ENCOUNTER — Other Ambulatory Visit: Payer: Self-pay

## 2024-08-30 ENCOUNTER — Inpatient Hospital Stay

## 2024-08-30 ENCOUNTER — Ambulatory Visit (HOSPITAL_COMMUNITY)

## 2024-08-30 ENCOUNTER — Inpatient Hospital Stay: Admitting: Internal Medicine

## 2024-09-05 ENCOUNTER — Encounter: Payer: Self-pay | Admitting: Internal Medicine

## 2024-09-05 ENCOUNTER — Ambulatory Visit (HOSPITAL_COMMUNITY)
Admission: RE | Admit: 2024-09-05 | Discharge: 2024-09-05 | Disposition: A | Discharge status: Home / Self Care | Source: Ambulatory Visit | Attending: Physician Assistant | Admitting: Physician Assistant

## 2024-09-05 ENCOUNTER — Inpatient Hospital Stay: Attending: Physician Assistant

## 2024-09-05 DIAGNOSIS — Z85118 Personal history of other malignant neoplasm of bronchus and lung: Secondary | ICD-10-CM | POA: Insufficient documentation

## 2024-09-05 DIAGNOSIS — C3412 Malignant neoplasm of upper lobe, left bronchus or lung: Secondary | ICD-10-CM | POA: Insufficient documentation

## 2024-09-05 DIAGNOSIS — Z08 Encounter for follow-up examination after completed treatment for malignant neoplasm: Secondary | ICD-10-CM | POA: Diagnosis present

## 2024-09-05 LAB — CMP (CANCER CENTER ONLY)
ALT: 14 U/L (ref 0–44)
AST: 17 U/L (ref 15–41)
Albumin: 4.3 g/dL (ref 3.5–5.0)
Alkaline Phosphatase: 48 U/L (ref 38–126)
Anion gap: 7 (ref 5–15)
BUN: 17 mg/dL (ref 8–23)
CO2: 27 mmol/L (ref 22–32)
Calcium: 10.2 mg/dL (ref 8.9–10.3)
Chloride: 106 mmol/L (ref 98–111)
Creatinine: 0.89 mg/dL (ref 0.61–1.24)
GFR, Estimated: >60 mL/min (ref >60)
Glucose, Bld: 89 mg/dL (ref 70–99)
Potassium: 4.2 mmol/L (ref 3.5–5.1)
Sodium: 140 mmol/L (ref 135–145)
Total Bilirubin: 0.4 mg/dL (ref 0.0–1.2)
Total Protein: 7.2 g/dL (ref 6.5–8.1)

## 2024-09-05 LAB — CBC WITH DIFFERENTIAL (CANCER CENTER ONLY)
Abs Immature Granulocytes: 0.01 K/uL (ref 0.00–0.07)
Basophils Absolute: 0 K/uL (ref 0.0–0.1)
Basophils Relative: 0 %
Eosinophils Absolute: 0.1 K/uL (ref 0.0–0.5)
Eosinophils Relative: 2 %
HCT: 41.9 % (ref 39.0–52.0)
Hemoglobin: 14.4 g/dL (ref 13.0–17.0)
Immature Granulocytes: 0 %
Lymphocytes Relative: 37 %
Lymphs Abs: 2.2 K/uL (ref 0.7–4.0)
MCH: 30.8 pg (ref 26.0–34.0)
MCHC: 34.4 g/dL (ref 30.0–36.0)
MCV: 89.7 fL (ref 80.0–100.0)
Monocytes Absolute: 0.5 K/uL (ref 0.1–1.0)
Monocytes Relative: 7 %
Neutro Abs: 3.3 K/uL (ref 1.7–7.7)
Neutrophils Relative %: 54 %
Platelet Count: 189 K/uL (ref 150–400)
RBC: 4.67 MIL/uL (ref 4.22–5.81)
RDW: 12.9 % (ref 11.5–15.5)
WBC Count: 6.1 K/uL (ref 4.0–10.5)
nRBC: 0 % (ref 0.0–0.2)

## 2024-09-05 MED ORDER — IOHEXOL 300 MG/ML  SOLN
75.0000 mL | Freq: Once | INTRAMUSCULAR | Status: AC | PRN
Start: 2024-09-05 — End: 2024-09-05
  Administered 2024-09-05: 75 mL via INTRAVENOUS

## 2024-09-07 ENCOUNTER — Other Ambulatory Visit: Payer: Self-pay | Admitting: Pulmonary Disease

## 2024-09-07 DIAGNOSIS — J310 Chronic rhinitis: Secondary | ICD-10-CM

## 2024-09-13 ENCOUNTER — Inpatient Hospital Stay: Admitting: Internal Medicine

## 2024-09-13 VITALS — BP 153/69 | HR 66 | Temp 98.3°F | Resp 17 | Ht 75.0 in | Wt 243.0 lb

## 2024-09-13 DIAGNOSIS — Z08 Encounter for follow-up examination after completed treatment for malignant neoplasm: Secondary | ICD-10-CM | POA: Diagnosis not present

## 2024-09-13 DIAGNOSIS — C3490 Malignant neoplasm of unspecified part of unspecified bronchus or lung: Secondary | ICD-10-CM | POA: Diagnosis not present

## 2024-09-13 NOTE — Progress Notes (Signed)
 [facility] [phone]   [phone]  OFFICE PROGRESS NOTE  Montey Lot, PA-C [address] [place] [address]  DIAGNOSIS: Stage IIIA (T3, N2, M0) non-small cell lung cancer, favoring adenocarcinoma presented with left hilar soft tissue mass in addition to left hilar and mediastinal lymphadenopathy diagnosed in January 2025.   PRIOR THERAPY: Neoadjuvant systemic chemotherapy with carboplatin  for AUC of 5, Alimta  500 Mg/M2 and nivolumab  360 Mg IV every 3 weeks.  First dose December 30, 2023.  Status post 3 cycles.  CURRENT THERAPY: Concurrent chemoradiation with weekly carboplatin  for AUC of 2 and Abraxane  45 Mg/M2.  Status post 3 cycles.  INTERVAL HISTORY: Jackson Powers 78 y.o. male returns to the clinic today for follow-up visit.Discussed the use of AI scribe software for clinical note transcription with the patient, who gave verbal consent to proceed.  History of Present Illness Jackson Powers is a 78 year old male with stage 3A non-small cell lung cancer who presents for evaluation with repeat CT scan of the chest for restaging of his disease. He is accompanied by his wife and daughter.  He has stage 3A non-small cell lung cancer, favoring adenocarcinoma, and is status post neoadjuvant systemic chemoimmunotherapy with carboplatin , pemetrexed , and nivolumab , followed by concurrent chemoradiation with weekly carboplatin  and Abraxane . He is currently under observation.  Since his last visit in June, approximately four months ago, he has experienced no new symptoms. No chest pain, breathing issues, hemoptysis, weight loss, night sweats, headaches, nausea, vomiting, or diarrhea. He has gained weight, which is a positive change as he had previously lost a significant amount of weight.  He has not experienced any symptoms such as shortness of breath or chest pain.  He has a history of sinus issues but denies any fever or chills associated with it. He  plans to discuss treatment for a sinus infection with his family doctor.  He mentions 'fiddling around, working' and doing 'whatever I want to do.'     MEDICAL HISTORY: Past Medical History:  Diagnosis Date   Acute gastritis without hemorrhage 2007   Arthritis    osteoarthritis    Benign neoplasm of colon 2007   COPD (chronic obstructive pulmonary disease) (HCC)    Esophagitis 2007   Hypertension    Lung cancer (HCC) 12/14/2023   Orthopnea    reports  i have trouble breathing when i lie completely flat. i have to use 2 people to prop my head up    Panic anxiety syndrome    patient reports having severe panic anxiety and claustraphobia. states with ear surgery years ago , they had to tie him down which caused him to panic even more;    Right BBB/left ant fasc block    (OLD)   TIA (transient ischemic attack)     ALLERGIES:  is allergic to fosaprepitant  and paclitaxel .  MEDICATIONS:  Current Outpatient Medications  Medication Sig Dispense Refill   amLODipine  (NORVASC ) 10 MG tablet Take 10 mg by mouth daily.     fluticasone  (FLONASE ) 50 MCG/ACT nasal spray SPRAY 1 SPRAY INTO BOTH NOSTRILS DAILY. 16 mL 2   lidocaine  (XYLOCAINE ) 2 % solution Use as directed 15 mLs in the mouth or throat every 3 (three) hours as needed for mouth pain (Oral/esophageal pain). 20 mL 0   metoprolol  succinate (TOPROL -XL) 25 MG 24 hr tablet Take 25 mg by mouth daily.     prochlorperazine  (COMPAZINE ) 10 MG tablet Take 1 tablet (10 mg  total) by mouth every 6 (six) hours as needed for nausea or vomiting. 30 tablet 0   sucralfate  (CARAFATE ) 1 g tablet DISSOLVE 1 TABLET IN 10 ML WATER AND SWALLOW 30 MIN PRIOR TO MEALS AND BEDTIME. (Patient not taking: Reported on 06/22/2024) 90 tablet 0   No current facility-administered medications for this visit.   Facility-Administered Medications Ordered in Other Visits  Medication Dose Route Frequency Provider Last Rate Last Admin   sodium chloride  flush (NS) 0.9 %  injection 3 mL  3 mL Intravenous Q12H Fernand Denyse LABOR, MD        SURGICAL HISTORY:  Past Surgical History:  Procedure Laterality Date   BRONCHIAL BIOPSY  12/02/2023   Procedure: BRONCHIAL BIOPSIES;  Surgeon: Isadora Hose, MD;  Location: [facility];  Service: Pulmonary;;   BRONCHIAL BRUSHINGS  12/02/2023   Procedure: BRONCHIAL BRUSHINGS;  Surgeon: Isadora Hose, MD;  Location: [facility];  Service: Pulmonary;;   BRONCHIAL NEEDLE ASPIRATION BIOPSY  12/02/2023   Procedure: BRONCHIAL NEEDLE ASPIRATION BIOPSIES;  Surgeon: Isadora Hose, MD;  Location: [facility];  Service: Pulmonary;;   BRONCHIAL WASHINGS  12/02/2023   Procedure: BRONCHIAL WASHINGS;  Surgeon: Isadora Hose, MD;  Location: [facility];  Service: Pulmonary;;   LEFT HEART CATH AND CORONARY ANGIOGRAPHY Left 10/01/2020   Procedure: LEFT HEART CATH AND CORONARY ANGIOGRAPHY;  Surgeon: Fernand Denyse LABOR, MD;  Location: [facility];  Service: Cardiovascular;  Laterality: Left;   SKIN CANCER EXCISION Right 2017   EAR; FAILED GRAFT FOLLWOING SURGERY    TOTAL HIP ARTHROPLASTY Right 07/20/2018   Procedure: RIGHT TOTAL HIP ARTHROPLASTY ANTERIOR APPROACH;  Surgeon: Melodi Lerner, MD;  Location: [facility];  Service: Orthopedics;  Laterality: Right;   VIDEO BRONCHOSCOPY WITH ENDOBRONCHIAL ULTRASOUND  12/02/2023   Procedure: VIDEO BRONCHOSCOPY WITH ENDOBRONCHIAL ULTRASOUND;  Surgeon: Isadora Hose, MD;  Location: [facility];  Service: Pulmonary;;    REVIEW OF SYSTEMS:  A comprehensive review of systems was negative except for: Ears, nose, mouth, throat, and face: positive for nasal congestion   PHYSICAL EXAMINATION: General appearance: alert, cooperative, and no distress Head: Normocephalic, without obvious abnormality, atraumatic Neck: no adenopathy, no JVD, supple, symmetrical, trachea midline, and thyroid  not enlarged, symmetric, no tenderness/mass/nodules Lymph nodes: Cervical, supraclavicular, and axillary nodes  normal. Resp: clear to auscultation bilaterally Back: symmetric, no curvature. ROM normal. No CVA tenderness. Cardio: regular rate and rhythm, S1, S2 normal, no murmur, click, rub or gallop GI: soft, non-tender; bowel sounds normal; no masses,  no organomegaly Extremities: extremities normal, atraumatic, no cyanosis or edema  ECOG PERFORMANCE STATUS: 1 - Symptomatic but completely ambulatory  Blood pressure (!) 153/69, pulse 66, temperature 98.3 F (36.8 C), temperature source Temporal, resp. rate 17, height 6' 3 (1.905 m), weight 243 lb (110.2 kg), SpO2 95%.  LABORATORY DATA: Lab Results  Component Value Date   WBC 6.1 09/05/2024   HGB 14.4 09/05/2024   HCT 41.9 09/05/2024   MCV 89.7 09/05/2024   PLT 189 09/05/2024      Chemistry      Component Value Date/Time   NA 140 09/05/2024 1333   K 4.2 09/05/2024 1333   CL 106 09/05/2024 1333   CO2 27 09/05/2024 1333   BUN 17 09/05/2024 1333   CREATININE 0.89 09/05/2024 1333      Component Value Date/Time   CALCIUM  10.2 09/05/2024 1333   ALKPHOS 48 09/05/2024 1333   AST 17 09/05/2024 1333   ALT 14 09/05/2024 1333   BILITOT 0.4 09/05/2024 1333  RADIOGRAPHIC STUDIES: CT Chest W Contrast Result Date: 09/07/2024 CLINICAL DATA:  Non-small-cell lung cancer restaging, assess treatment response * Tracking Code: BO * EXAM: CT CHEST WITH CONTRAST TECHNIQUE: Multidetector CT imaging of the chest was performed during intravenous contrast administration. RADIATION DOSE REDUCTION: This exam was performed according to the departmental dose-optimization program which includes automated exposure control, adjustment of the mA and/or kV according to patient size and/or use of iterative reconstruction technique. CONTRAST:  75mL OMNIPAQUE  IOHEXOL  300 MG/ML  SOLN COMPARISON:  05/08/2024 FINDINGS: Cardiovascular: Aortic atherosclerosis. Normal heart size. Three-vessel coronary artery calcifications. No pericardial effusion. Mediastinum/Nodes: No  enlarged mediastinal, hilar, or axillary lymph nodes. Thyroid  gland, trachea, and esophagus demonstrate no significant findings. Lungs/Pleura: Moderate centrilobular emphysema. Similar size of a treated mass in the left hilum measuring approximately 5.2 x 1.6 cm (series 2, image 88). Interval increase in bandlike irregular heterogeneous and consolidative opacity in the perihilar left lung (series 6, image 87). New small left pleural effusion. Unchanged nodule in the peripheral inferior right upper lobe measuring 1.1 x 0.9 cm (series 6, image 79). Mild scarring or atelectasis in the right lung base. Upper Abdomen: No acute abnormality.  Hepatic steatosis. Musculoskeletal: No chest wall abnormality. No acute osseous findings. IMPRESSION: 1. Similar size of a treated mass in the left hilum. Interval increase in bandlike irregular heterogeneous and consolidative opacity in the perihilar left lung, most consistent with evolving radiation pneumonitis and fibrosis. 2. New small left pleural effusion. 3. Unchanged nodule in the peripheral inferior right upper lobe measuring 1.1 x 0.9 cm. 4. Emphysema. 5. Coronary artery disease. 6. Hepatic steatosis. Aortic Atherosclerosis (ICD10-I70.0) and Emphysema (ICD10-J43.9). Electronically Signed   By: Marolyn JONETTA Jaksch M.D.   On: 09/07/2024 11:23      ASSESSMENT AND PLAN: This is a very pleasant 78 years old white male with Stage IIIA (T3, N2, M0) non-small cell lung cancer, favoring adenocarcinoma presented with left hilar soft tissue mass in addition to left hilar and mediastinal lymphadenopathy diagnosed in January 2025.  He underwent neoadjuvant systemic chemotherapy with carboplatin  for AUC of 5, Alimta  500 Mg/M2 and nivolumab  360 Mg IV every 3 weeks status post 3 cycles.  He has been tolerating this treatment fairly well. The patient was not a good surgical candidate for resection.  He is here for evaluation and discussion of his treatment options. He underwent a course of  concurrent chemoradiation with weekly carboplatin  and Abraxane  status post 4 cycles.  He is currently on observation. He had repeat CT scan of the chest performed recently.  I personally independently reviewed the scan and discussed the result with the patient and his family.  His scan showed no concerning findings for disease progression.  There is new small left pleural effusion that we will monitor closely in the interval. Assessment and Plan Assessment & Plan Stage 3A non-small cell lung cancer, adenocarcinoma type Status post neoadjuvant systemic chemoimmunotherapy with carboplatin , pemetrexed , and nivolumab , followed by concurrent chemoradiation with weekly carboplatin  and Abraxane . Currently under surveillance with no evidence of tumor growth or metastasis on recent CT scan. Radiation scarring is present and continues to change, but is well-managed. No new symptoms such as chest pain, dyspnea, or hemoptysis reported. Weight gain noted, which is positive given previous weight loss. - Continue surveillance with repeat CT scan in four months - Monitor for symptoms such as dyspnea or chest pain  Small left pleural effusion New small left pleural effusion noted on recent CT scan. It is asymptomatic, with no intervention  required at this time. Potential causes include fluid retention from cardiac issues or other conditions. No symptoms such as dyspnea or fever reported. - Continue observation of pleural effusion - Monitor for symptoms such as dyspnea He was advised to call immediately if he has any concerning symptoms.  The patient voices understanding of current disease status and treatment options and is in agreement with the current care plan.  All questions were answered. The patient knows to call the clinic with any problems, questions or concerns. We can certainly see the patient much sooner if necessary.  The total time spent in the appointment was 20 minutes.  Disclaimer: This note was  dictated with voice recognition software. Similar sounding words can inadvertently be transcribed and may not be corrected upon review.

## 2024-09-14 ENCOUNTER — Telehealth: Payer: Self-pay | Admitting: Internal Medicine

## 2024-09-14 NOTE — Telephone Encounter (Signed)
 Scheduled patient for next appointment. Called and spoke with the patients wife, she is aware.

## 2024-12-19 ENCOUNTER — Encounter: Payer: Self-pay | Admitting: Internal Medicine

## 2025-01-02 ENCOUNTER — Ambulatory Visit (HOSPITAL_COMMUNITY)

## 2025-01-02 ENCOUNTER — Inpatient Hospital Stay

## 2025-01-10 ENCOUNTER — Inpatient Hospital Stay: Admitting: Internal Medicine
